# Patient Record
Sex: Female | Born: 1945
Health system: Southern US, Community
[De-identification: ages and names within clinical notes are randomized; demographics above are authoritative.]

## PROBLEM LIST (undated history)

## (undated) DIAGNOSIS — Z8709 Personal history of other diseases of the respiratory system: Secondary | ICD-10-CM

## (undated) DIAGNOSIS — R0600 Dyspnea, unspecified: Secondary | ICD-10-CM

## (undated) DIAGNOSIS — M8580 Other specified disorders of bone density and structure, unspecified site: Secondary | ICD-10-CM

## (undated) DIAGNOSIS — Z860100 Personal history of colon polyps, unspecified: Secondary | ICD-10-CM

## (undated) DIAGNOSIS — T7840XA Allergy, unspecified, initial encounter: Secondary | ICD-10-CM

## (undated) DIAGNOSIS — I10 Essential (primary) hypertension: Secondary | ICD-10-CM

## (undated) DIAGNOSIS — M1711 Unilateral primary osteoarthritis, right knee: Secondary | ICD-10-CM

## (undated) DIAGNOSIS — M1990 Unspecified osteoarthritis, unspecified site: Secondary | ICD-10-CM

## (undated) DIAGNOSIS — M2550 Pain in unspecified joint: Secondary | ICD-10-CM

## (undated) DIAGNOSIS — M2540 Effusion, unspecified joint: Secondary | ICD-10-CM

## (undated) DIAGNOSIS — Z8619 Personal history of other infectious and parasitic diseases: Secondary | ICD-10-CM

## (undated) DIAGNOSIS — K219 Gastro-esophageal reflux disease without esophagitis: Secondary | ICD-10-CM

## (undated) HISTORY — DX: Unilateral primary osteoarthritis, right knee: M17.11

## (undated) HISTORY — DX: Personal history of colon polyps, unspecified: Z86.0100

## (undated) HISTORY — PX: COLONOSCOPY: SHX174

## (undated) HISTORY — PX: APPENDECTOMY: SHX54

## (undated) HISTORY — DX: Allergy, unspecified, initial encounter: T78.40XA

## (undated) HISTORY — DX: Other specified disorders of bone density and structure, unspecified site: M85.80

## (undated) HISTORY — DX: Personal history of colonic polyps: Z86.010

---

## 2004-07-09 ENCOUNTER — Ambulatory Visit: Payer: Self-pay | Admitting: Family Medicine

## 2004-07-20 ENCOUNTER — Ambulatory Visit: Payer: Self-pay | Admitting: Family Medicine

## 2004-08-21 ENCOUNTER — Ambulatory Visit: Payer: Self-pay | Admitting: Family Medicine

## 2005-07-12 HISTORY — PX: NASAL POLYP SURGERY: SHX186

## 2005-07-12 HISTORY — PX: CHOLECYSTECTOMY: SHX55

## 2005-07-19 ENCOUNTER — Ambulatory Visit: Payer: Self-pay | Admitting: Family Medicine

## 2005-07-20 ENCOUNTER — Encounter (INDEPENDENT_AMBULATORY_CARE_PROVIDER_SITE_OTHER): Payer: Self-pay | Admitting: Specialist

## 2005-07-20 ENCOUNTER — Ambulatory Visit (HOSPITAL_COMMUNITY): Admission: EM | Admit: 2005-07-20 | Discharge: 2005-07-21 | Payer: Self-pay | Admitting: Emergency Medicine

## 2005-07-28 ENCOUNTER — Ambulatory Visit: Payer: Self-pay | Admitting: Family Medicine

## 2005-07-29 ENCOUNTER — Ambulatory Visit: Payer: Self-pay | Admitting: Family Medicine

## 2005-08-30 ENCOUNTER — Encounter: Admission: RE | Admit: 2005-08-30 | Discharge: 2005-08-30 | Payer: Self-pay | Admitting: Neurology

## 2007-02-28 DIAGNOSIS — J45909 Unspecified asthma, uncomplicated: Secondary | ICD-10-CM | POA: Insufficient documentation

## 2007-02-28 DIAGNOSIS — Z860100 Personal history of colon polyps, unspecified: Secondary | ICD-10-CM | POA: Insufficient documentation

## 2007-02-28 DIAGNOSIS — M8580 Other specified disorders of bone density and structure, unspecified site: Secondary | ICD-10-CM | POA: Insufficient documentation

## 2007-02-28 DIAGNOSIS — F329 Major depressive disorder, single episode, unspecified: Secondary | ICD-10-CM | POA: Insufficient documentation

## 2010-04-06 ENCOUNTER — Telehealth: Payer: Self-pay | Admitting: Family Medicine

## 2010-04-06 ENCOUNTER — Encounter: Payer: Self-pay | Admitting: Family Medicine

## 2010-05-28 ENCOUNTER — Encounter: Payer: Self-pay | Admitting: Family Medicine

## 2010-05-28 ENCOUNTER — Other Ambulatory Visit: Admission: RE | Admit: 2010-05-28 | Discharge: 2010-05-28 | Payer: Self-pay | Admitting: Family Medicine

## 2010-05-28 ENCOUNTER — Ambulatory Visit: Payer: Self-pay | Admitting: Family Medicine

## 2010-05-28 DIAGNOSIS — I10 Essential (primary) hypertension: Secondary | ICD-10-CM | POA: Insufficient documentation

## 2010-05-28 LAB — CONVERTED CEMR LAB
Nitrite: NEGATIVE
Urobilinogen, UA: 0.2
WBC Urine, dipstick: NEGATIVE

## 2010-05-29 LAB — CONVERTED CEMR LAB
Albumin: 3.8 g/dL (ref 3.5–5.2)
Alkaline Phosphatase: 87 units/L (ref 39–117)
Basophils Relative: 0.9 % (ref 0.0–3.0)
CO2: 30 meq/L (ref 19–32)
Calcium: 9.4 mg/dL (ref 8.4–10.5)
Chloride: 100 meq/L (ref 96–112)
Eosinophils Absolute: 0.2 10*3/uL (ref 0.0–0.7)
Hemoglobin: 14 g/dL (ref 12.0–15.0)
Lymphocytes Relative: 18.9 % (ref 12.0–46.0)
MCHC: 33.9 g/dL (ref 30.0–36.0)
MCV: 92.1 fL (ref 78.0–100.0)
Neutro Abs: 5.6 10*3/uL (ref 1.4–7.7)
RBC: 4.49 M/uL (ref 3.87–5.11)
Sodium: 140 meq/L (ref 135–145)
Total CHOL/HDL Ratio: 3
Total Protein: 6.7 g/dL (ref 6.0–8.3)
Vit D, 25-Hydroxy: 48 ng/mL (ref 30–89)

## 2010-06-02 ENCOUNTER — Encounter: Payer: Self-pay | Admitting: Family Medicine

## 2010-06-03 ENCOUNTER — Encounter: Payer: Self-pay | Admitting: Family Medicine

## 2010-06-03 ENCOUNTER — Ambulatory Visit: Payer: Self-pay | Admitting: Family Medicine

## 2010-06-18 ENCOUNTER — Encounter
Admission: RE | Admit: 2010-06-18 | Discharge: 2010-06-18 | Payer: Self-pay | Source: Home / Self Care | Attending: Family Medicine | Admitting: Family Medicine

## 2010-06-23 ENCOUNTER — Telehealth: Payer: Self-pay | Admitting: *Deleted

## 2010-08-11 ENCOUNTER — Ambulatory Visit
Admission: RE | Admit: 2010-08-11 | Discharge: 2010-08-11 | Payer: Self-pay | Source: Home / Self Care | Attending: Family Medicine | Admitting: Family Medicine

## 2010-08-11 NOTE — Letter (Signed)
Summary: Results Follow-up Letter  [place] [facility] at [facility]  [address] [place], [place] [address]   Phone: [phone]  Fax: [phone]    06/02/2010  [address] [place], [place]  [address]  Dear Ms. Sjogren,   The following are the results of your recent test(s):  Test     Result     Pap Smear    Normal___YES REPEAT IN 2 YEARS ____   Sincerely,  Dr. Almon Hercules     [place] [facility]

## 2010-08-11 NOTE — Letter (Signed)
Summary: [facility]  [facility]   Imported By: Maryln Gottron 04/09/2010 13:11:18  _____________________________________________________________________  External Attachment:    Type:   Image     Comment:   External Document

## 2010-08-11 NOTE — Progress Notes (Signed)
Summary: PT RE-EST (MEDICARE)  Phone Note Call from Patient   Caller: Patient Summary of Call: The following info was sent via flag to Dr Clent Ridges:    ---- 04/06/2010 9:31 AM, Nelwyn Salisbury MD wrote: yes I will see her again   ---- 04/06/2010 9:29 AM, Debbra Riding wrote: Pt Aimee Lawrence, MRN: [id number]) called in to schedule a cpx appt with Dr Clent Ridges.... Pt has not been to our practice since January 2007 and is now on Medicare.... Will you accept pt as a re-established pt so that cpx can be scheduled?  Pt can be reached at [phone]. Initial call taken by: Debbra Riding,  April 06, 2010 9:46 AM

## 2010-08-11 NOTE — Miscellaneous (Signed)
Summary: BONE DENSITY  Clinical Lists Changes  Orders: Added new Test order of T-Lumbar Vertebral Assessment (77082) - Signed 

## 2010-08-11 NOTE — Letter (Signed)
Summary: PATIENT HX FORM  PATIENT HX FORM   Imported By: Georgian Co 05/28/2010 14:12:10  _____________________________________________________________________  External Attachment:    Type:   Image     Comment:   External Document

## 2010-08-11 NOTE — Assessment & Plan Note (Signed)
Summary: PT RE-EST / CPX (PT WILL COME IN FASTING) // RS   Vital Signs:  Patient profile:   65 year old female Height:      61 inches Weight:      177 pounds BMI:     33.56 O2 Sat:      97 % Temp:     97.9 degrees F BP sitting:   124 / 84  (left arm)  Vitals Entered By: Pura Spice, RN (May 28, 2010 9:06 AM) CC: cpx fasting with pap pt states had pneunovax 2009 and TD at [facility] 2011 -she will get documentation of this faxed to [place]    History of Present Illness: 65 yr old female to re-establish with [place] and for a cpx. We last saw her in 2007. She has split her time between living here in [place] and in [place]. She had a cpx with labs about 2 years ago in [place]. She feels fine today and has no complaints. She has had no SOB or chest pains, and her BP has been stable. She has put on some weight, and she has not been exercising lately. BMs are regular, as are urinations.   Allergies: 1)  ! Erythromycin 2)  ! Ampicillin  Past History:  Past Medical History: Osteopenia, last DEXA in 2009 in [place] Allergies Asthma Colonic polyps, hx of Depression  Past Surgical History: nasal polypectomy2007 colonoscopy 2008 in [place], clear, repeat in 5 yrs  Family History: Reviewed history from 02/28/2007 and no changes required. Family History of Alcoholism/Addiction Family History of Arthritis Family History Breast cancer 1st degree relative <50 Family History Diabetes 1st degree relative Family History of Cardiovascular disorder  Social History: Reviewed history from 02/28/2007 and no changes required. Retired Single Never Smoked Alcohol use-yes Drug use-no Regular exercise-no  Review of Systems  The patient denies anorexia, fever, weight loss, weight gain, vision loss, decreased hearing, hoarseness, chest pain, syncope, dyspnea on exertion, peripheral edema, prolonged cough, headaches, hemoptysis, abdominal pain, melena, hematochezia, severe indigestion/heartburn,  hematuria, incontinence, genital sores, muscle weakness, suspicious skin lesions, transient blindness, difficulty walking, depression, unusual weight change, abnormal bleeding, enlarged lymph nodes, angioedema, breast masses, and testicular masses.    Physical Exam  General:  overweight-appearing.   Head:  Normocephalic and atraumatic without obvious abnormalities. No apparent alopecia or balding. Eyes:  No corneal or conjunctival inflammation noted. EOMI. Perrla. Funduscopic exam benign, without hemorrhages, exudates or papilledema. Vision grossly normal. Ears:  External ear exam shows no significant lesions or deformities.  Otoscopic examination reveals clear canals, tympanic membranes are intact bilaterally without bulging, retraction, inflammation or discharge. Hearing is grossly normal bilaterally. Nose:  External nasal examination shows no deformity or inflammation. Nasal mucosa are pink and moist without lesions or exudates. Mouth:  Oral mucosa and oropharynx without lesions or exudates.  Teeth in good repair. Neck:  No deformities, masses, or tenderness noted. Chest Wall:  No deformities, masses, or tenderness noted. Breasts:  No mass, nodules, thickening, tenderness, bulging, retraction, inflamation, nipple discharge or skin changes noted.   Lungs:  Normal respiratory effort, chest expands symmetrically. Lungs are clear to auscultation, no crackles or wheezes. Heart:  Normal rate and regular rhythm. S1 and S2 normal without gallop, murmur, click, rub or other extra sounds. EKG normal Abdomen:  Bowel sounds positive,abdomen soft and non-tender without masses, organomegaly or hernias noted. Rectal:  No external abnormalities noted. Normal sphincter tone. No rectal masses or tenderness. Heme neg.  Genitalia:  Pelvic Exam:  External: normal female genitalia without lesions or masses        Vagina: normal without lesions or masses        Cervix: normal without lesions or masses         Adnexa: normal bimanual exam without masses or fullness        Uterus: normal by palpation        Pap smear: performed Msk:  No deformity or scoliosis noted of thoracic or lumbar spine.   Pulses:  R and L carotid,radial,femoral,dorsalis pedis and posterior tibial pulses are full and equal bilaterally Extremities:  No clubbing, cyanosis, edema, or deformity noted with normal full range of motion of all joints.   Neurologic:  No cranial nerve deficits noted. Station and gait are normal. Plantar reflexes are down-going bilaterally. DTRs are symmetrical throughout. Sensory, motor and coordinative functions appear intact. Skin:  Intact without suspicious lesions or rashes Cervical Nodes:  No lymphadenopathy noted Axillary Nodes:  No palpable lymphadenopathy Inguinal Nodes:  No significant adenopathy Psych:  Cognition and judgment appear intact. Alert and cooperative with normal attention span and concentration. No apparent delusions, illusions, hallucinations   Impression & Recommendations:  Problem # 1:  ASTHMA (ICD-493.90)  The following medications were removed from the medication list:    Albuterol 90 Mcg/act Aers (Albuterol)  Problem # 2:  HYPERTENSION (ICD-401.9)  Orders: UA Dipstick w/o Micro (automated)  (81003) EKG w/ Interpretation (93000) Venipuncture (16109) TLB-Lipid Panel (80061-LIPID) TLB-BMP (Basic Metabolic Panel-BMET) (80048-METABOL) TLB-CBC Platelet - w/Differential (85025-CBCD) TLB-Hepatic/Liver Function Pnl (80076-HEPATIC) TLB-TSH (Thyroid Stimulating Hormone) (84443-TSH) Specimen Handling (60454)  Problem # 3:  DEPRESSION (ICD-311)  Problem # 4:  COLONIC POLYPS, HX OF (ICD-V12.72)  Orders: Hemoccult Guaiac-1 spec.(in office) (82270)  Problem # 5:  OSTEOPENIA (ICD-733.90)  The following medications were removed from the medication list:    Fosamax 35 Mg Tabs (Alendronate sodium)  Orders: T-Vitamin D (25-Hydroxy) (09811-91478) Specimen Handling  (99000)  Patient Instructions: 1)  It is important that you exercise reguarly at least 20 minutes 5 times a week. If you develop chest pain, have severe difficulty breathing, or feel very tired, stop exercising immediately and seek medical attention.  2)  You need to lose weight. Consider a lower calorie diet and regular exercise.  3)  Schedule your mammogram.  4)  get labs today 5)  set up a DEXA   Orders Added: 1)  New Patient Level IV [99204] 2)  UA Dipstick w/o Micro (automated)  [81003] 3)  Hemoccult Guaiac-1 spec.(in office) [82270] 4)  EKG w/ Interpretation [93000] 5)  Venipuncture [36415] 6)  TLB-Lipid Panel [80061-LIPID] 7)  TLB-BMP (Basic Metabolic Panel-BMET) [80048-METABOL] 8)  TLB-CBC Platelet - w/Differential [85025-CBCD] 9)  TLB-Hepatic/Liver Function Pnl [80076-HEPATIC] 10)  TLB-TSH (Thyroid Stimulating Hormone) [84443-TSH] 11)  T-Vitamin D (25-Hydroxy) [29562-13086] 12)  Specimen Handling [99000]   Immunization History:  Tetanus/Td Immunization History:    Tetanus/Td:  historical (05/28/2010)  Pneumovax Immunization History:    Pneumovax:  historical (07/13/2007)   Immunization History:  Tetanus/Td Immunization History:    Tetanus/Td:  Historical (05/28/2010)  Pneumovax Immunization History:    Pneumovax:  Historical (07/13/2007)   [facility]     Laboratory Results   Urine Tests    Routine Urinalysis   Color: yellow Appearance: Clear Glucose: negative   (Normal Range: Negative) Bilirubin: negative   (Normal Range: Negative) Ketone: negative   (Normal Range: Negative) Spec. Gravity: 1.010   (Normal Range: 1.003-1.035) Blood: 1+   (  Normal Range: Negative) pH: 5.0   (Normal Range: 5.0-8.0) Protein: negative   (Normal Range: Negative) Urobilinogen: 0.2   (Normal Range: 0-1) Nitrite: negative   (Normal Range: Negative) Leukocyte Esterace: negative   (Normal Range: Negative)    Comments: Rita Ohara  May 28, 2010 10:43 AM     Appended Document: referral for DEXA     Clinical Lists Changes  Orders: Added new Test order of T-Bone Densitometry [phone]) - Signed

## 2010-08-13 NOTE — Progress Notes (Signed)
Summary: bmd results  Phone Note Call from Patient Call back at [phone]   Caller: Patient Call For: Nelwyn Salisbury MD Summary of Call: PT would like bmd results Initial call taken by: Heron Sabins,  June 23, 2010 10:25 AM  Follow-up for Phone Call        see report Follow-up by: Nelwyn Salisbury MD,  June 23, 2010 1:28 PM  Additional Follow-up for Phone Call Additional follow up Details #1::        see lab results. Additional Follow-up by: Romualdo Bolk, CMA (AAMA),  June 23, 2010 3:12 PM

## 2010-11-27 NOTE — Op Note (Signed)
NAMEFRANSISCA, Aimee Lawrence                   ACCOUNT NO.:  [id number]   MEDICAL RECORD NO.:  [id number]          PATIENT TYPE:  INP   LOCATION:  0102                         FACILITY:  [facility]   PHYSICIAN:  Anselm Pancoast. Weatherly, M.D.DATE OF BIRTH:  11-08-1945   DATE OF PROCEDURE:  07/20/2005  DATE OF DISCHARGE:                                 OPERATIVE REPORT   PREOPERATIVE DIAGNOSIS:  Acute cholecystitis with stones.   POSTOPERATIVE DIAGNOSIS:  Acute cholecystitis with stones.   OPERATION:  Laparoscopic cholecystectomy with cholangiogram.   SURGEON:  Anselm Pancoast. Zachery Dakins, MD.   ASSISTANT:  Alfonse Ras, MD.   HISTORY:  Aimee Lawrence is a 65 year old female who had the onset of epigastric  pain right side after eating supper on a Saturday. [place] the [place]. She  went to the emergency room there and was scheduled for an ultrasound the  following morning. No laboratory studies were done. The ultrasound showed  gallstones but not any signs of acute inflammation of the gallbladder. She  returned to the [facility] and was seen the following day by her physician,  Dr. Abran Cantor who referred her over to the ER. I evaluated her and time did not  permit her to get on the OR schedule yesterday and she is scheduled for  today. It is now later in the afternoon before time really is available to  proceed with surgery.  She has been on Cipro and was given a dose  preoperatively. She has PAS stockings. The abdomen was prepped with Betadine  surgical solution and draped in a sterile manner after the induction of  general anesthesia and endotracheal tube. A small incision was made below  the umbilicus, kind of carefully opened through the fascia and then the  peritoneum was picked up and I  carefully opened through it and I could  insert my finger into the peritoneal cavity. A pursestring suture of #0  Vicryl was placed and then the Hasson cannula introduced. The gallbladder  was very acutely inflamed with  fluid around it and a lot of adhesions. The  upper 10 mm trocar was placed in the subxiphoid area and then the two  lateral 5 mm trocars were placed the appropriate lateral position. The liver  was large enough that it kind of hangs down and it was difficult because of  the position so close to the umbilical port and this is why her pain was so  low. I grasped the gallbladder and retracted upward and outward. The  adhesions to the omentum were carefully taken down and then we could get to  the base of the gallbladder. I then opened the peritoneum, identified the  cystic duct and this was encompassed with a right angle clip flush with the  gallbladder and then a Cook catheter introduced just proximal and held in  place with a clip, x-ray obtained. It shows good filling of the extrahepatic  biliary system low into the duodenum, _____________ common bile duct. The  intrahepatic radicals filled. I then removed the catheter, triply clipped  the cystic duct after  encompassing the right angle and then divided it. The  cystic artery was dissected free, doubly clipped proximally, singly,  distally and divided and then using the first hook and then going to the  spatula with electrocautery, the gallbladder was freed from its bed. There  was some bile spillage and the two stones were grasped with pooper scooper  and then we used the ___________ aspirator to remove all the bile, etc. Then  I went ahead and freed the carbon out of the gallbladder from the bed with  the spatula set up on regular ___________. I placed the inflamed gallbladder  in an EndoCatch bag and then continued kind of cauterizing the base with  good hemostasis irrigating and aspirating satisfactory and then I had placed  the gallbladder in an EndoCatch bag, switched the camera into the upper 10  mm port, withdrew the bag ___________  gallbladder ___________ quadrants. We  opened the bag, withdrew the gallbladder. There were numerous  fairly large  stones as well as a bunch of small stones and these were brushed and then  brought out without enlarging the fascia at the umbilicus. An additional  figure-of-eight suture of #0 Vicryl was placed and both were tied. I  reinspected, the irrigating fluid was aspirated, there was no evidence of  any bleeding or bile. The little fluid that was in the pelvis was aspirated  and then the 5 mm port was withdrawn under direct vision and brought outside  the body. The patient tolerated the procedure nicely ___________. The  patient will be kept overnight ___________ Cipro and will be following her  sugar.  Sugars have not been elevated here.           ______________________________  Anselm Pancoast. Zachery Dakins, M.D.     WJW/MEDQ  D:  07/20/2005  T:  07/21/2005  Job:  [id number]   cc:   Jeannett Senior A. Clent Ridges, M.D. [facility]  [address] [place]  [place] [address]

## 2010-11-27 NOTE — H&P (Signed)
NAMELYZBETH, GENRICH                   ACCOUNT NO.:  [id number]   MEDICAL RECORD NO.:  [id number]          PATIENT TYPE:  INP   LOCATION:  0102                         FACILITY:  [facility]   PHYSICIAN:  Anselm Pancoast. Weatherly, M.D.DATE OF BIRTH:  17-Nov-1945   DATE OF ADMISSION:  07/19/2005  DATE OF DISCHARGE:                                HISTORY & PHYSICAL   CHIEF COMPLAINT:  Abdominal pain.   HISTORY:  Charlotta Lapaglia is a 65 year old female, diabetic on oral medication,  who was visiting the beach over the past weekend and on Saturday night,  after eating a heavy meal, she started having severe epigastric pain and  then went to the emergency room where they did an EKG and evaluated her and  thought that she was probably having a gallbladder attack.  They released,  after giving her pain medication, but had her scheduled for an ultrasound of  the gallbladder on Sunday.  This did show gallstones but not acute and  inflamed gallbladder.  She returned to [facility] where she lives and saw  Dr. Clent Ridges in his office on Monday about mid day.  He called when she said she  was having basically continuous pain and presented to the emergency room.  She had a mildly elevated white count.  Her pain, however, was more right  sided much lower than the typical acute gallbladder.  We had a report of the  ultrasound but did not actually have an ultrasound copy and I was concerned  with the pain being so low could this be appendicitis and she still have her  appendix.  I obtained a CT.  The schedule was all backed up and then when  this was actually completed it was about 11:30.  I elected to add her onto  the OR schedule for the following day instead of doing her after midnight  last night.  The patient was admitted, however, there were no beds and she  has been in the emergency room overnight.  I started her on Cipro and gave  her a soap suds enema to cleanse her colon as she had a large amount of  stool in the  colon and she is still having pain the following morning but it  is not as intense as it was previously.  She was added to the OR schedule as  soon as an OR room was available.   PAST MEDICAL HISTORY:  She has had a GYN procedure.   CHRONIC MEDICATIONS:  1.  Nexium 40 mg a day.  2.  She was given Percocet for pain.  3.  Glucophage 500 mg daily.  4.  Adderall 10 mg daily.   ALLERGIES:  1.  PENICILLIN developed swelling.  2.  E-MYCIN.  3.  ASPIRIN.   She does not smoke.   PHYSICAL EXAMINATION:  GENERAL:  She is a short female, appears younger than  her stated age.  VITAL SIGNS:  Temperature is 98.6, pulse 88, respirations 18, blood pressure  is 141/83.  EENT:  Well hydrated.  No cervical or supraclavicular lymphadenopathy.  LUNGS:  Good breath sounds bilaterally.  CARDIAC:  Normal sinus rhythm.  BREASTS:  Negative.  ABDOMEN:  She is mildly tender on the right side of the abdomen but it is  not really right upper quadrant.  It is basically lateral to her umbilicus.  Her left side is not tender.  EXTREMITIES:  No pedal edema and good peripheral pulses.   ADMISSION IMPRESSION:  Acute cholecystitis.   The patient will be added to the OR schedule as time permits according to  the OR schedule.  I have placed her on Cipro and she is NPO.           ______________________________  Anselm Pancoast. Zachery Dakins, M.D.     WJW/MEDQ  D:  07/20/2005  T:  07/20/2005  Job:  [id number]   cc:   Anselm Pancoast. Zachery Dakins, M.D.  [address], [address]    [place] [address]   patient's chart

## 2010-12-09 ENCOUNTER — Encounter: Payer: Self-pay | Admitting: Family Medicine

## 2010-12-09 ENCOUNTER — Ambulatory Visit (INDEPENDENT_AMBULATORY_CARE_PROVIDER_SITE_OTHER): Payer: Medicare Other | Admitting: Family Medicine

## 2010-12-09 VITALS — BP 128/88 | HR 78 | Temp 97.7°F | Resp 14 | Wt 184.5 lb

## 2010-12-09 DIAGNOSIS — L0390 Cellulitis, unspecified: Secondary | ICD-10-CM

## 2010-12-09 DIAGNOSIS — T63301A Toxic effect of unspecified spider venom, accidental (unintentional), initial encounter: Secondary | ICD-10-CM

## 2010-12-09 DIAGNOSIS — L0291 Cutaneous abscess, unspecified: Secondary | ICD-10-CM

## 2010-12-09 DIAGNOSIS — T63391A Toxic effect of venom of other spider, accidental (unintentional), initial encounter: Secondary | ICD-10-CM

## 2010-12-09 DIAGNOSIS — T6391XA Toxic effect of contact with unspecified venomous animal, accidental (unintentional), initial encounter: Secondary | ICD-10-CM

## 2010-12-09 MED ORDER — DOXYCYCLINE HYCLATE 100 MG PO CAPS: 100.0000 mg | ORAL_CAPSULE | Freq: Two times a day (BID) | ORAL | Status: AC

## 2010-12-09 NOTE — Progress Notes (Signed)
  Subjective:    Patient ID: Aimee Lawrence, female    DOB: 08-07-1945, 65 y.o.   MRN: [id number]  HPI Here with a 2 day hx of possible insect bites. She has been trimming shrubbery in her yard lately, and yesterday she noticed 2 red spots on her legs. One is on the right knee and one is on the left lower leg. These have been painful and have been red.    Review of Systems  Constitutional: Negative.   Skin: Positive for rash.       Objective:   Physical Exam  Constitutional: She appears well-developed and well-nourished.  Skin:       There are 2 lesions, one on the medial right knee and the other on the left lower leg. These have a central vessicle containing clear fluid which is surrounded by a zone of erythema.           Assessment & Plan:  These appear to be spider bites. Cover for cellulitis with Doxycycline.

## 2011-06-28 ENCOUNTER — Encounter: Payer: Self-pay | Admitting: Family Medicine

## 2011-06-28 ENCOUNTER — Telehealth: Payer: Self-pay | Admitting: Family Medicine

## 2011-06-28 NOTE — Telephone Encounter (Signed)
Pt has bad head congestion/ sinus infection x 1 week and would like to be seen today.

## 2011-06-28 NOTE — Telephone Encounter (Signed)
Pt is aware of appt on 06/29/2011 at 9:30

## 2011-06-29 ENCOUNTER — Encounter: Payer: Self-pay | Admitting: Family Medicine

## 2011-06-29 ENCOUNTER — Ambulatory Visit (INDEPENDENT_AMBULATORY_CARE_PROVIDER_SITE_OTHER): Payer: Medicare Other | Admitting: Family Medicine

## 2011-06-29 VITALS — BP 112/78 | HR 79 | Temp 98.1°F | Wt 188.0 lb

## 2011-06-29 DIAGNOSIS — J329 Chronic sinusitis, unspecified: Secondary | ICD-10-CM

## 2011-06-29 MED ORDER — AZITHROMYCIN 250 MG PO TABS: ORAL_TABLET | ORAL | Status: AC

## 2011-06-29 NOTE — Progress Notes (Signed)
  Subjective:    Patient ID: Aimee Lawrence, female    DOB: Nov 11, 1945, 65 y.o.   MRN: [id number]  HPI Here for 2 weeks of sinus pressure, PND, ST, and HA.    Review of Systems  Constitutional: Negative.   HENT: Positive for congestion, postnasal drip and sinus pressure.   Eyes: Negative.   Respiratory: Negative.        Objective:   Physical Exam  Constitutional: She appears well-developed and well-nourished.  HENT:  Right Ear: External ear normal.  Left Ear: External ear normal.  Nose: Nose normal.  Mouth/Throat: Oropharynx is clear and moist. No oropharyngeal exudate.  Eyes: Conjunctivae are normal.  Pulmonary/Chest: Effort normal and breath sounds normal.  Lymphadenopathy:    She has no cervical adenopathy.          Assessment & Plan:  Add Mucinex

## 2011-07-05 ENCOUNTER — Telehealth: Payer: Self-pay | Admitting: Family Medicine

## 2011-07-05 MED ORDER — AZITHROMYCIN 250 MG PO TABS: ORAL_TABLET | ORAL | Status: AC

## 2011-07-05 NOTE — Telephone Encounter (Signed)
Pt REQUESTING ZPACK REFILL  pt also requesting to be contacted she had shooting pains in her leg and wanted to know what she should do or could take for this   [facility]

## 2011-07-05 NOTE — Telephone Encounter (Signed)
Call in another Zpack. As for the leg pains, she can try Ibuprofen but she probably needs an OV to look at this

## 2011-07-05 NOTE — Telephone Encounter (Signed)
Script called in and spoke with pt. 

## 2011-08-23 ENCOUNTER — Ambulatory Visit (INDEPENDENT_AMBULATORY_CARE_PROVIDER_SITE_OTHER): Payer: Medicare Other | Admitting: Family Medicine

## 2011-08-23 ENCOUNTER — Encounter: Payer: Self-pay | Admitting: Family Medicine

## 2011-08-23 VITALS — BP 116/72 | HR 80 | Temp 97.7°F | Wt 193.0 lb

## 2011-08-23 DIAGNOSIS — J45901 Unspecified asthma with (acute) exacerbation: Secondary | ICD-10-CM

## 2011-08-23 MED ORDER — ALBUTEROL SULFATE HFA 108 (90 BASE) MCG/ACT IN AERS: 2.0000 | INHALATION_SPRAY | RESPIRATORY_TRACT | Status: DC | PRN

## 2011-08-23 NOTE — Progress Notes (Signed)
  Subjective:    Patient ID: Aimee Lawrence, female    DOB: 1946-06-09, 66 y.o.   MRN: [id number]  HPI Here for intermittent wheezing and SOB for the past few months. No cough or fever or chest pains. She has a hx of asthma but has not treated it for years.    Review of Systems  Constitutional: Negative.   HENT: Negative.   Eyes: Negative.   Respiratory: Positive for shortness of breath. Negative for apnea and cough.   Cardiovascular: Negative.        Objective:   Physical Exam  Constitutional: She appears well-developed and well-nourished.  Neck: No thyromegaly present.  Cardiovascular: Normal rate, normal heart sounds and intact distal pulses.   Pulmonary/Chest: Effort normal and breath sounds normal. No respiratory distress. She has no wheezes. She has no rales.  Lymphadenopathy:    She has no cervical adenopathy.          Assessment & Plan:  Try Proair prn

## 2012-05-29 ENCOUNTER — Encounter: Payer: Self-pay | Admitting: Family Medicine

## 2012-05-29 ENCOUNTER — Ambulatory Visit (INDEPENDENT_AMBULATORY_CARE_PROVIDER_SITE_OTHER): Payer: Medicare Other | Admitting: Family Medicine

## 2012-05-29 ENCOUNTER — Ambulatory Visit (INDEPENDENT_AMBULATORY_CARE_PROVIDER_SITE_OTHER)
Admission: RE | Admit: 2012-05-29 | Discharge: 2012-05-29 | Disposition: A | Discharge status: Home / Self Care | Payer: Medicare Other | Source: Ambulatory Visit | Attending: Family Medicine | Admitting: Family Medicine

## 2012-05-29 VITALS — BP 130/80 | HR 90 | Temp 98.6°F | Wt 188.0 lb

## 2012-05-29 DIAGNOSIS — J329 Chronic sinusitis, unspecified: Secondary | ICD-10-CM

## 2012-05-29 DIAGNOSIS — R0781 Pleurodynia: Secondary | ICD-10-CM

## 2012-05-29 DIAGNOSIS — R079 Chest pain, unspecified: Secondary | ICD-10-CM

## 2012-05-29 MED ORDER — AZITHROMYCIN 250 MG PO TABS: ORAL_TABLET | ORAL | Status: DC

## 2012-05-29 NOTE — Progress Notes (Signed)
  Subjective:    Patient ID: Aimee Lawrence, female    DOB: 1946/03/23, 66 y.o.   MRN: [id number]  HPI Here for 2 things. First she has had a mild pain in the right side for 4 months. No trauma hx. No coughing or SOB. Also for one week she has had sinus pressure, PND, and a ST. No fever.    Review of Systems  Constitutional: Negative.   HENT: Positive for congestion, postnasal drip and sinus pressure.   Eyes: Negative.   Respiratory: Negative.   Cardiovascular: Positive for chest pain. Negative for palpitations and leg swelling.       Objective:   Physical Exam  Constitutional: She appears well-developed and well-nourished.  HENT:  Right Ear: External ear normal.  Left Ear: External ear normal.  Nose: Nose normal.  Mouth/Throat: Oropharynx is clear and moist.  Eyes: Conjunctivae normal are normal.  Neck: No thyromegaly present.  Pulmonary/Chest: Effort normal and breath sounds normal.       Tender over the right ribs just below the axilla. No crepitus   Lymphadenopathy:    She has no cervical adenopathy.          Assessment & Plan:  Treat the sinusitis with a Zpack. Get Xrays of the ribs.

## 2012-05-31 NOTE — Progress Notes (Signed)
Quick Note:  I left voice message with results. ______ 

## 2012-06-05 ENCOUNTER — Other Ambulatory Visit: Payer: Self-pay | Admitting: Family Medicine

## 2012-06-05 ENCOUNTER — Ambulatory Visit (INDEPENDENT_AMBULATORY_CARE_PROVIDER_SITE_OTHER): Payer: Medicare Other | Admitting: Family Medicine

## 2012-06-05 ENCOUNTER — Encounter: Payer: Self-pay | Admitting: Family Medicine

## 2012-06-05 ENCOUNTER — Encounter: Payer: Self-pay | Admitting: Internal Medicine

## 2012-06-05 VITALS — BP 132/84 | HR 86 | Temp 97.9°F | Ht 61.0 in | Wt 187.0 lb

## 2012-06-05 DIAGNOSIS — Z1231 Encounter for screening mammogram for malignant neoplasm of breast: Secondary | ICD-10-CM

## 2012-06-05 DIAGNOSIS — F329 Major depressive disorder, single episode, unspecified: Secondary | ICD-10-CM

## 2012-06-05 DIAGNOSIS — Z860100 Personal history of colon polyps, unspecified: Secondary | ICD-10-CM

## 2012-06-05 DIAGNOSIS — J45909 Unspecified asthma, uncomplicated: Secondary | ICD-10-CM

## 2012-06-05 DIAGNOSIS — I10 Essential (primary) hypertension: Secondary | ICD-10-CM

## 2012-06-05 DIAGNOSIS — F3289 Other specified depressive episodes: Secondary | ICD-10-CM

## 2012-06-05 LAB — CBC WITH DIFFERENTIAL/PLATELET
Basophils Relative: 0.7 % (ref 0.0–3.0)
Eosinophils Relative: 5.3 % — ABNORMAL HIGH (ref 0.0–5.0)
HCT: 41.1 % (ref 36.0–46.0)
Lymphs Abs: 1.9 10*3/uL (ref 0.7–4.0)
MCV: 90.3 fl (ref 78.0–100.0)
Monocytes Absolute: 0.6 10*3/uL (ref 0.1–1.0)
Monocytes Relative: 8.5 % (ref 3.0–12.0)
Neutrophils Relative %: 59.2 % (ref 43.0–77.0)
RBC: 4.56 Mil/uL (ref 3.87–5.11)
WBC: 7.3 10*3/uL (ref 4.5–10.5)

## 2012-06-05 LAB — BASIC METABOLIC PANEL
Calcium: 9.4 mg/dL (ref 8.4–10.5)
GFR: 88.29 mL/min (ref >60.00)
Sodium: 139 mEq/L (ref 135–145)

## 2012-06-05 LAB — LIPID PANEL
HDL: 38.5 mg/dL — ABNORMAL LOW (ref >39.00)
LDL Cholesterol: 106 mg/dL — ABNORMAL HIGH (ref 0–99)
Total CHOL/HDL Ratio: 5
VLDL: 35.4 mg/dL (ref 0.0–40.0)

## 2012-06-05 LAB — POCT URINALYSIS DIPSTICK
Bilirubin, UA: NEGATIVE
Blood, UA: NEGATIVE
Glucose, UA: NEGATIVE
Spec Grav, UA: 1.02

## 2012-06-05 LAB — HEPATIC FUNCTION PANEL
Alkaline Phosphatase: 121 U/L — ABNORMAL HIGH (ref 39–117)
Bilirubin, Direct: 0.1 mg/dL (ref 0.0–0.3)
Total Bilirubin: 0.3 mg/dL (ref 0.3–1.2)

## 2012-06-05 LAB — TSH: TSH: 1.02 u[IU]/mL (ref 0.35–5.50)

## 2012-06-05 MED ORDER — FLUTICASONE PROPIONATE 50 MCG/ACT NA SUSP: 2.0000 | Freq: Every day | NASAL | Status: DC

## 2012-06-05 NOTE — Progress Notes (Signed)
  Subjective:    Patient ID: Aimee Lawrence, female    DOB: 1945/11/02, 66 y.o.   MRN: [id number]  HPI 66 yr old female for a cpx. She feels well in general. She was recently treated for an URI and this has resolved.    Review of Systems  Constitutional: Negative.   HENT: Negative.   Eyes: Negative.   Respiratory: Negative.   Cardiovascular: Negative.   Gastrointestinal: Negative.   Genitourinary: Negative for dysuria, urgency, frequency, hematuria, flank pain, decreased urine volume, enuresis, difficulty urinating, pelvic pain and dyspareunia.  Musculoskeletal: Negative.   Skin: Negative.   Neurological: Negative.   Hematological: Negative.   Psychiatric/Behavioral: Negative.        Objective:   Physical Exam  Constitutional: She is oriented to person, place, and time. She appears well-developed and well-nourished. No distress.  HENT:  Head: Normocephalic and atraumatic.  Right Ear: External ear normal.  Left Ear: External ear normal.  Nose: Nose normal.  Mouth/Throat: Oropharynx is clear and moist. No oropharyngeal exudate.  Eyes: Conjunctivae normal and EOM are normal. Pupils are equal, round, and reactive to light. No scleral icterus.  Neck: Normal range of motion. Neck supple. No JVD present. No thyromegaly present.  Cardiovascular: Normal rate, regular rhythm, normal heart sounds and intact distal pulses.  Exam reveals no gallop and no friction rub.   No murmur heard.      EKG normal   Pulmonary/Chest: Effort normal and breath sounds normal. No respiratory distress. She has no wheezes. She has no rales. She exhibits no tenderness.  Abdominal: Soft. Bowel sounds are normal. She exhibits no distension and no mass. There is no tenderness. There is no rebound and no guarding.  Musculoskeletal: Normal range of motion. She exhibits no edema and no tenderness.  Lymphadenopathy:    She has no cervical adenopathy.  Neurological: She is alert and oriented to person, place, and time.  She has normal reflexes. No cranial nerve deficit. She exhibits normal muscle tone. Coordination normal.  Skin: Skin is warm and dry. No rash noted. No erythema.  Psychiatric: She has a normal mood and affect. Her behavior is normal. Judgment and thought content normal.          Assessment & Plan:  Well exam. Get fasting labs. Set up a colonoscopy. She will set up a mammogram.

## 2012-06-06 ENCOUNTER — Encounter: Payer: Self-pay | Admitting: Family Medicine

## 2012-06-06 NOTE — Progress Notes (Signed)
Quick Note:  I tried to reach pt by phone, no answer. I did put a copy of results in mail. ______ 

## 2012-06-07 ENCOUNTER — Ambulatory Visit
Admission: RE | Admit: 2012-06-07 | Discharge: 2012-06-07 | Disposition: A | Discharge status: Home / Self Care | Payer: Medicare Other | Source: Ambulatory Visit | Attending: Family Medicine | Admitting: Family Medicine

## 2012-06-07 DIAGNOSIS — Z1231 Encounter for screening mammogram for malignant neoplasm of breast: Secondary | ICD-10-CM

## 2012-06-13 ENCOUNTER — Other Ambulatory Visit: Payer: Self-pay | Admitting: Otolaryngology

## 2012-06-13 ENCOUNTER — Ambulatory Visit
Admission: RE | Admit: 2012-06-13 | Discharge: 2012-06-13 | Disposition: A | Discharge status: Home / Self Care | Payer: Medicare Other | Source: Ambulatory Visit | Attending: Otolaryngology | Admitting: Otolaryngology

## 2012-06-13 DIAGNOSIS — R0981 Nasal congestion: Secondary | ICD-10-CM

## 2012-06-23 ENCOUNTER — Encounter (HOSPITAL_BASED_OUTPATIENT_CLINIC_OR_DEPARTMENT_OTHER): Payer: Self-pay | Admitting: *Deleted

## 2012-06-23 NOTE — Progress Notes (Signed)
No heart or resp problems-recent h/p dr fry-no labs needed

## 2012-06-26 ENCOUNTER — Telehealth: Payer: Self-pay | Admitting: *Deleted

## 2012-06-26 ENCOUNTER — Ambulatory Visit (AMBULATORY_SURGERY_CENTER): Payer: Medicare Other | Admitting: *Deleted

## 2012-06-26 VITALS — Ht 61.0 in | Wt 186.4 lb

## 2012-06-26 DIAGNOSIS — Z1211 Encounter for screening for malignant neoplasm of colon: Secondary | ICD-10-CM

## 2012-06-26 MED ORDER — NA SULFATE-K SULFATE-MG SULF 17.5-3.13-1.6 GM/177ML PO SOLN: ORAL | Status: DC

## 2012-06-26 NOTE — H&P (Signed)
PREOPERATIVE H&P  Chief Complaint: nasal congestion  HPI: Aimee Lawrence is a 66 y.o. female who presents for evaluation of nasal obstruction and recurrence of her sinonasal polyps. She had previous surgery for nasal polyps in 2007. She has a significant recurrence of polyps right side worse than left. She's taken to the OR for FESS and removal of sinonasal polyps.  Past Medical History  Diagnosis Date  . Osteopenia     last dexa in 2009 in [place]  . Allergy   . Asthma   . Hx of colonic polyps   . Depression    Past Surgical History  Procedure Date  . Nasal polyp surgery 2007  . Colonoscopy 2008    in [place] clear repeat in 5 years  . Cholecystectomy 2007    lap choli   History   Social History  . Marital Status: Single    Spouse Name: N/A    Number of Children: N/A  . Years of Education: N/A   Social History Main Topics  . Smoking status: Never Smoker   . Smokeless tobacco: Never Used  . Alcohol Use: Yes     Comment: once every 3 months  . Drug Use: No  . Sexually Active: Not on file   Other Topics Concern  . Not on file   Social History Narrative   RetiredSingleRegular exercise- no   Family History  Problem Relation Age of Onset  . Alcohol abuse    . Arthritis    . Breast cancer    . Diabetes    . Heart disease    . Colon cancer Neg Hx   . Stomach cancer Neg Hx    Allergies  Allergen Reactions  . Aleve (Naproxen Sodium)     Short of breath  . Aspirin     Trouble breathing  . Erythromycin Nausea And Vomiting  . Ibuprofen     Short of breath  . Ampicillin Rash   Prior to Admission medications   Medication Sig Start Date End Date Taking? Authorizing Provider  albuterol (PROAIR HFA) 108 (90 BASE) MCG/ACT inhaler Inhale 2 puffs into the lungs every 4 (four) hours as needed for wheezing or shortness of breath. 08/23/11 08/22/12  Nelwyn Salisbury, MD  Ascorbic Acid (VITAMIN C CR PO) Take by mouth.      Historical Provider, MD  Calcium Carbonate (CALCIUM 600  PO) Take by mouth.      Historical Provider, MD  Cetirizine HCl (ZYRTEC PO) Take by mouth daily.    Historical Provider, MD  Cholecalciferol (VITAMIN D-3 PO) Take by mouth.      Historical Provider, MD  FIBER FORMULA CAPS Take by mouth.      Historical Provider, MD  fish oil-omega-3 fatty acids 1000 MG capsule Take 2 g by mouth daily.      Historical Provider, MD  fluticasone (FLONASE) 50 MCG/ACT nasal spray Place 2 sprays into the nose daily. 06/05/12   Nelwyn Salisbury, MD  Na Sulfate-K Sulfate-Mg Sulf (SUPREP BOWEL PREP) SOLN suprep as directed.  No substitutions 06/26/12   Iva Boop, MD  Pediatric Multivit-Minerals-C (KIDS GUMMY BEAR VITAMINS PO) Take 2 each by mouth daily.      Historical Provider, MD     Positive ROS: nasal obstruction  All other systems have been reviewed and were otherwise negative with the exception of those mentioned in the HPI and as above.  Physical Exam: There were no vitals filed for this visit.  General: Alert, no  acute distress Oral: Normal oral mucosa and tonsils Nasal: Bilateral sinonasal polyps R>L Neck: No palpable adenopathy or thyroid nodules Ear: Ear canal is clear with normal appearing TMs Cardiovascular: Regular rate and rhythm, no murmur.  Respiratory: Clear to auscultation Neurologic: Alert and oriented x 3   Assessment/Plan: SINUS DISEASE Plan for Procedure(s): ENDOSCOPIC SINUS SURGERY WITH FUSION Daiva Eves, MD 06/26/2012 4:53 PM

## 2012-06-26 NOTE — Telephone Encounter (Signed)
Colonoscopy ok based upon this info

## 2012-06-26 NOTE — Telephone Encounter (Signed)
Dr Leone Payor: pt is scheduled for direct colonoscopy 07/20/2012.  First colonoscopy 8 years ago in [place].  Pt says: had colon polyps and needed to repeat in 3 years.   Next colonoscopy 2008 in [place]; pt says no polyps.  Pt does not remember who did either colonoscopy procedure.  No family history of colon cancer. Please advise.   Also, she is scheduled for sinus surgery with Dr. Narda Bonds 12/17- 3 weeks before colonoscopy is scheduled. She will ask Dr. Ezzard Standing if it is okay to have colonoscopy 3 weeks after sinus surgery.

## 2012-06-26 NOTE — Telephone Encounter (Deleted)
Dr. Leone Payor: pt is scheduled for direct colonoscopy 07/20/12.  First colonoscopy 8 years in [place].  Pt says: had colon polyps and needed to repeat in 3 years.

## 2012-06-27 ENCOUNTER — Ambulatory Visit (HOSPITAL_BASED_OUTPATIENT_CLINIC_OR_DEPARTMENT_OTHER)
Admission: RE | Admit: 2012-06-27 | Discharge: 2012-06-27 | Disposition: A | Discharge status: Home / Self Care | Payer: Medicare Other | Source: Ambulatory Visit | Attending: Otolaryngology | Admitting: Otolaryngology

## 2012-06-27 ENCOUNTER — Encounter (HOSPITAL_BASED_OUTPATIENT_CLINIC_OR_DEPARTMENT_OTHER): Payer: Self-pay

## 2012-06-27 ENCOUNTER — Encounter (HOSPITAL_BASED_OUTPATIENT_CLINIC_OR_DEPARTMENT_OTHER): Payer: Self-pay | Admitting: Anesthesiology

## 2012-06-27 ENCOUNTER — Encounter (HOSPITAL_BASED_OUTPATIENT_CLINIC_OR_DEPARTMENT_OTHER)
Admission: RE | Disposition: A | Discharge status: Home / Self Care | Payer: Self-pay | Source: Ambulatory Visit | Attending: Otolaryngology

## 2012-06-27 ENCOUNTER — Ambulatory Visit (HOSPITAL_BASED_OUTPATIENT_CLINIC_OR_DEPARTMENT_OTHER): Payer: Medicare Other | Admitting: Anesthesiology

## 2012-06-27 DIAGNOSIS — J45909 Unspecified asthma, uncomplicated: Secondary | ICD-10-CM | POA: Insufficient documentation

## 2012-06-27 DIAGNOSIS — Z8249 Family history of ischemic heart disease and other diseases of the circulatory system: Secondary | ICD-10-CM | POA: Insufficient documentation

## 2012-06-27 DIAGNOSIS — M948X9 Other specified disorders of cartilage, unspecified sites: Secondary | ICD-10-CM | POA: Insufficient documentation

## 2012-06-27 DIAGNOSIS — Z860100 Personal history of colon polyps, unspecified: Secondary | ICD-10-CM | POA: Insufficient documentation

## 2012-06-27 DIAGNOSIS — F329 Major depressive disorder, single episode, unspecified: Secondary | ICD-10-CM | POA: Insufficient documentation

## 2012-06-27 DIAGNOSIS — Z8261 Family history of arthritis: Secondary | ICD-10-CM | POA: Insufficient documentation

## 2012-06-27 DIAGNOSIS — Z886 Allergy status to analgesic agent status: Secondary | ICD-10-CM | POA: Insufficient documentation

## 2012-06-27 DIAGNOSIS — Z9089 Acquired absence of other organs: Secondary | ICD-10-CM | POA: Insufficient documentation

## 2012-06-27 DIAGNOSIS — J338 Other polyp of sinus: Secondary | ICD-10-CM | POA: Insufficient documentation

## 2012-06-27 DIAGNOSIS — Z880 Allergy status to penicillin: Secondary | ICD-10-CM | POA: Insufficient documentation

## 2012-06-27 DIAGNOSIS — F3289 Other specified depressive episodes: Secondary | ICD-10-CM | POA: Insufficient documentation

## 2012-06-27 DIAGNOSIS — Z881 Allergy status to other antibiotic agents status: Secondary | ICD-10-CM | POA: Insufficient documentation

## 2012-06-27 DIAGNOSIS — Z833 Family history of diabetes mellitus: Secondary | ICD-10-CM | POA: Insufficient documentation

## 2012-06-27 DIAGNOSIS — J343 Hypertrophy of nasal turbinates: Secondary | ICD-10-CM | POA: Insufficient documentation

## 2012-06-27 HISTORY — PX: SINUS ENDO W/FUSION: SHX777

## 2012-06-27 LAB — POCT HEMOGLOBIN-HEMACUE: Hemoglobin: 13.6 g/dL (ref 12.0–15.0)

## 2012-06-27 SURGERY — SINUS SURGERY, ENDOSCOPIC, USING COMPUTER-ASSISTED NAVIGATION
Anesthesia: General | Site: Nose | Wound class: Clean Contaminated

## 2012-06-27 MED ORDER — GLYCOPYRROLATE 0.2 MG/ML IJ SOLN
INTRAMUSCULAR | Status: DC | PRN
  Administered 2012-06-27: 0.4 mg via INTRAVENOUS

## 2012-06-27 MED ORDER — OXYMETAZOLINE HCL 0.05 % NA SOLN
NASAL | Status: DC | PRN
  Administered 2012-06-27: 1 via NASAL

## 2012-06-27 MED ORDER — ONDANSETRON HCL 4 MG/2ML IJ SOLN
INTRAMUSCULAR | Status: DC | PRN
  Administered 2012-06-27: 4 mg via INTRAVENOUS

## 2012-06-27 MED ORDER — HYDROCODONE-ACETAMINOPHEN 5-500 MG PO TABS
1.0000 | ORAL_TABLET | Freq: Four times a day (QID) | ORAL | Status: DC | PRN
Start: 2012-06-27 — End: 2012-11-10

## 2012-06-27 MED ORDER — HYDROMORPHONE HCL PF 1 MG/ML IJ SOLN
0.2500 mg | INTRAMUSCULAR | Status: DC | PRN
  Administered 2012-06-27 (×2): 0.5 mg via INTRAVENOUS

## 2012-06-27 MED ORDER — NEOSTIGMINE METHYLSULFATE 1 MG/ML IJ SOLN
INTRAMUSCULAR | Status: DC | PRN
  Administered 2012-06-27: 3 mg via INTRAVENOUS

## 2012-06-27 MED ORDER — LACTATED RINGERS IV SOLN
INTRAVENOUS | Status: DC
  Administered 2012-06-27 (×2): via INTRAVENOUS

## 2012-06-27 MED ORDER — BACITRACIN ZINC 500 UNIT/GM EX OINT
TOPICAL_OINTMENT | CUTANEOUS | Status: DC | PRN
  Administered 2012-06-27: 1 via TOPICAL

## 2012-06-27 MED ORDER — EPHEDRINE SULFATE 50 MG/ML IJ SOLN
INTRAMUSCULAR | Status: DC | PRN
  Administered 2012-06-27: 10 mg via INTRAVENOUS

## 2012-06-27 MED ORDER — PROPOFOL 10 MG/ML IV BOLUS
INTRAVENOUS | Status: DC | PRN
  Administered 2012-06-27: 150 mg via INTRAVENOUS

## 2012-06-27 MED ORDER — ALBUTEROL SULFATE HFA 108 (90 BASE) MCG/ACT IN AERS
INHALATION_SPRAY | RESPIRATORY_TRACT | Status: DC | PRN
  Administered 2012-06-27: 2 via RESPIRATORY_TRACT

## 2012-06-27 MED ORDER — MIDAZOLAM HCL 5 MG/5ML IJ SOLN
INTRAMUSCULAR | Status: DC | PRN
  Administered 2012-06-27: 1 mg via INTRAVENOUS

## 2012-06-27 MED ORDER — LIDOCAINE HCL (CARDIAC) 20 MG/ML IV SOLN
INTRAVENOUS | Status: DC | PRN
  Administered 2012-06-27: 75 mg via INTRAVENOUS

## 2012-06-27 MED ORDER — ROCURONIUM BROMIDE 100 MG/10ML IV SOLN
INTRAVENOUS | Status: DC | PRN
  Administered 2012-06-27: 30 mg via INTRAVENOUS

## 2012-06-27 MED ORDER — CEFAZOLIN SODIUM-DEXTROSE 2-3 GM-% IV SOLR
INTRAVENOUS | Status: DC | PRN
  Administered 2012-06-27: 2 g via INTRAVENOUS

## 2012-06-27 MED ORDER — SODIUM CHLORIDE 0.9 % IR SOLN
Status: DC | PRN
  Administered 2012-06-27: 450 mL

## 2012-06-27 MED ORDER — LIDOCAINE-EPINEPHRINE 1 %-1:100000 IJ SOLN
INTRAMUSCULAR | Status: DC | PRN
  Administered 2012-06-27: 10 mL
  Administered 2012-06-27: 2 mL

## 2012-06-27 MED ORDER — CEPHALEXIN 500 MG PO CAPS: 500.0000 mg | ORAL_CAPSULE | Freq: Two times a day (BID) | ORAL | Status: AC

## 2012-06-27 MED ORDER — FENTANYL CITRATE 0.05 MG/ML IJ SOLN
INTRAMUSCULAR | Status: DC | PRN
  Administered 2012-06-27 (×2): 50 ug via INTRAVENOUS

## 2012-06-27 MED ORDER — DEXAMETHASONE SODIUM PHOSPHATE 4 MG/ML IJ SOLN
INTRAMUSCULAR | Status: DC | PRN
  Administered 2012-06-27: 10 mg via INTRAVENOUS

## 2012-06-27 MED ORDER — METHYLPREDNISOLONE ACETATE 80 MG/ML IJ SUSP
INTRAMUSCULAR | Status: DC | PRN
  Administered 2012-06-27: 80 mg via INTRALESIONAL

## 2012-06-27 SURGICAL SUPPLY — 73 items
BLADE INF TURB ROT M4 2 5PK (BLADE) ×1 IMPLANT
BLADE RAD60 ROTATE M4 4 5PK (BLADE) IMPLANT
BLADE ROTATE RAD 12 4 M4 (BLADE) IMPLANT
BLADE ROTATE RAD 40 4 M4 (BLADE) IMPLANT
BLADE ROTATE TRICUT 4X13 M4 (BLADE) ×2 IMPLANT
BUR HS RAD FRONTAL 3 (BURR) IMPLANT
CANISTER SUC SOCK COL 7 IN (MISCELLANEOUS) ×3 IMPLANT
CANISTER SUCTION 1200CC (MISCELLANEOUS) ×2 IMPLANT
CATH SINUS BALLN 7X16 (CATHETERS) IMPLANT
CATH SINUS BALLN RELIEV 6X16 (SINUPLASTY) IMPLANT
CATH SINUS GUIDE F-70 (CATHETERS) IMPLANT
CATH SINUS GUIDE M/110 (CATHETERS) IMPLANT
CATH SINUS IRRIGATION 2.0 (CATHETERS) IMPLANT
CLEANER CAUTERY TIP 5X5 PAD (MISCELLANEOUS) IMPLANT
CLOTH BEACON ORANGE TIMEOUT ST (SAFETY) ×2 IMPLANT
COAGULATOR SUCT 8FR VV (MISCELLANEOUS) ×1 IMPLANT
DECANTER SPIKE VIAL GLASS SM (MISCELLANEOUS) ×1 IMPLANT
DEVICE INFLATION 20/61 (MISCELLANEOUS) IMPLANT
DRAPE SURG 17X23 STRL (DRAPES) ×1 IMPLANT
DRESSING NASAL KENNEDY 3.5X.9 (MISCELLANEOUS) IMPLANT
DRSG NASAL KENNEDY 3.5X.9 (MISCELLANEOUS)
DRSG NASAL KENNEDY LMNT 8CM (GAUZE/BANDAGES/DRESSINGS) IMPLANT
DRSG NASOPORE 8CM (GAUZE/BANDAGES/DRESSINGS) ×1 IMPLANT
DRSG TELFA 3X8 NADH (GAUZE/BANDAGES/DRESSINGS) IMPLANT
ELECT COATED BLADE 2.86 ST (ELECTRODE) IMPLANT
ELECT NDL BLADE 2-5/6 (NEEDLE) ×1 IMPLANT
ELECT NEEDLE BLADE 2-5/6 (NEEDLE) IMPLANT
ELECT REM PT RETURN 9FT ADLT (ELECTROSURGICAL) ×2
ELECTRODE REM PT RTRN 9FT ADLT (ELECTROSURGICAL) IMPLANT
GLOVE BIO SURGEON STRL SZ7.5 (GLOVE) ×1 IMPLANT
GLOVE BIOGEL PI IND STRL 8 (GLOVE) IMPLANT
GLOVE BIOGEL PI INDICATOR 8 (GLOVE) ×1
GLOVE SKINSENSE NS SZ7.0 (GLOVE) ×3
GLOVE SKINSENSE STRL SZ7.0 (GLOVE) IMPLANT
GLOVE SS BIOGEL STRL SZ 7.5 (GLOVE) ×1 IMPLANT
GLOVE SUPERSENSE BIOGEL SZ 7.5 (GLOVE) ×1
GOWN PREVENTION PLUS XLARGE (GOWN DISPOSABLE) ×3 IMPLANT
GOWN PREVENTION PLUS XXLARGE (GOWN DISPOSABLE) ×3 IMPLANT
HANDLE SINUS GUIDE LP (INSTRUMENTS) IMPLANT
HANDPIECE HYDRODEBRIDER FRONT (BLADE) IMPLANT
HEMOSTAT SURGICEL .5X2 ABSORB (HEMOSTASIS) IMPLANT
HEMOSTAT SURGICEL 2X14 (HEMOSTASIS) IMPLANT
IV NS 1000ML (IV SOLUTION)
IV NS 1000ML BAXH (IV SOLUTION) IMPLANT
IV NS 500ML (IV SOLUTION) ×2
IV NS 500ML BAXH (IV SOLUTION) ×2 IMPLANT
NDL SPNL 25GX3.5 QUINCKE BL (NEEDLE) IMPLANT
NEEDLE 27GAX1X1/2 (NEEDLE) ×2 IMPLANT
NEEDLE SPNL 25GX3.5 QUINCKE BL (NEEDLE) IMPLANT
NS IRRIG 1000ML POUR BTL (IV SOLUTION) ×1 IMPLANT
PACK BASIN DAY SURGERY FS (CUSTOM PROCEDURE TRAY) ×2 IMPLANT
PACK ENT DAY SURGERY (CUSTOM PROCEDURE TRAY) ×2 IMPLANT
PAD CLEANER CAUTERY TIP 5X5 (MISCELLANEOUS)
PAD DRESSING TELFA 3X8 NADH (GAUZE/BANDAGES/DRESSINGS) IMPLANT
PAD ENT ADHESIVE 25PK (MISCELLANEOUS) ×2 IMPLANT
PATTIES SURGICAL .5 X3 (DISPOSABLE) ×2 IMPLANT
PENCIL BUTTON HOLSTER BLD 10FT (ELECTRODE) IMPLANT
SLEEVE SCD COMPRESS KNEE MED (MISCELLANEOUS) ×2 IMPLANT
SOLUTION ANTI FOG 6CC (MISCELLANEOUS) ×2 IMPLANT
SPONGE GAUZE 2X2 8PLY STRL LF (GAUZE/BANDAGES/DRESSINGS) ×2 IMPLANT
SUT CHROMIC 4 0 PS 2 18 (SUTURE) IMPLANT
SUT SILK 2 0 FS (SUTURE) IMPLANT
SYR 3ML 18GX1 1/2 (SYRINGE) ×1 IMPLANT
SYSTEM HYDRODEBRIDER (MISCELLANEOUS) IMPLANT
SYSTEM RELIEVA LUMA ILLUM (SINUPLASTY) IMPLANT
TOWEL OR 17X24 6PK STRL BLUE (TOWEL DISPOSABLE) ×3 IMPLANT
TRACKER ENT INSTRUMENT (MISCELLANEOUS) ×2 IMPLANT
TRACKER ENT PATIENT (MISCELLANEOUS) ×2 IMPLANT
TRAY DSU PREP LF (CUSTOM PROCEDURE TRAY) ×2 IMPLANT
TUBE CONNECTING 20X1/4 (TUBING) ×2 IMPLANT
TUBING STRAIGHTSHOT EPS 5PK (TUBING) ×2 IMPLANT
WATER STERILE IRR 1000ML POUR (IV SOLUTION) ×3 IMPLANT
YANKAUER SUCT BULB TIP NO VENT (SUCTIONS) ×2 IMPLANT

## 2012-06-27 NOTE — Anesthesia Preprocedure Evaluation (Addendum)
Anesthesia Evaluation  Patient identified by MRN, date of birth, ID band Patient awake  General Assessment Comment:History reviewed. Patient denies DM, Atrial fib/irregular heart beat. No CP or SOB.  Reviewed: Allergy & Precautions, H&P , NPO status , Patient's Chart, lab work & pertinent test results  Airway Mallampati: II      Dental   Pulmonary asthma ,  breath sounds clear to auscultation        Cardiovascular hypertension, Pt. on medications Rhythm:Regular Rate:Normal     Neuro/Psych Anxiety    GI/Hepatic negative GI ROS, Neg liver ROS,   Endo/Other  negative endocrine ROS  Renal/GU negative Renal ROS     Musculoskeletal   Abdominal   Peds  Hematology negative hematology ROS (+)   Anesthesia Other Findings   Reproductive/Obstetrics                          Anesthesia Physical Anesthesia Plan  ASA: III  Anesthesia Plan: General   Post-op Pain Management:    Induction: Intravenous  Airway Management Planned: Oral ETT  Additional Equipment:   Intra-op Plan:   Post-operative Plan: Extubation in OR  Informed Consent: I have reviewed the patients History and Physical, chart, labs and discussed the procedure including the risks, benefits and alternatives for the proposed anesthesia with the patient or authorized representative who has indicated his/her understanding and acceptance.     Plan Discussed with: CRNA, Anesthesiologist and Surgeon  Anesthesia Plan Comments:         Anesthesia Quick Evaluation

## 2012-06-27 NOTE — Brief Op Note (Signed)
06/27/2012  10:02 AM  PATIENT:  Aimee Lawrence  66 y.o. female  PRE-OPERATIVE DIAGNOSIS:  SINUS DISEASE  POST-OPERATIVE DIAGNOSIS:  bilateral nasal sinus disease  PROCEDURE:  Procedure(s) (LRB) with comments: ENDOSCOPIC SINUS SURGERY WITH FUSION NAVIGATION (N/A) - FESS FUSION PROTOCOL Bilateral Turbinate Reductions  SURGEON:  Surgeon(s) and Role:    * Drema Halon, MD - Primary  PHYSICIAN ASSISTANT:   ASSISTANTS: none   ANESTHESIA:   general  EBL:  Total I/O In: 1000 [I.V.:1000] Out: -   BLOOD ADMINISTERED:none  DRAINS: none   LOCAL MEDICATIONS USED:  LIDOCAINE with EPI  SPECIMEN:  Source of Specimen:  nasal polyps  DISPOSITION OF SPECIMEN:  PATHOLOGY  COUNTS:  YES  TOURNIQUET:  * No tourniquets in log *  DICTATION: .Other Dictation: Dictation Number [id number]  PLAN OF CARE: Discharge to home after PACU  PATIENT DISPOSITION:  PACU - hemodynamically stable.   Delay start of Pharmacological VTE agent (>24hrs) due to surgical blood loss or risk of bleeding: yes

## 2012-06-27 NOTE — Interval H&P Note (Signed)
History and Physical Interval Note:  06/27/2012 7:30 AM  Aimee Lawrence  has presented today for surgery, with the diagnosis of SINUS DISEASE  The various methods of treatment have been discussed with the patient and family. After consideration of risks, benefits and other options for treatment, the patient has consented to  Procedure(s) (LRB) with comments: ENDOSCOPIC SINUS SURGERY WITH FUSION NAVIGATION (N/A) - FESS FUSION PROTOCOL as a surgical intervention .  The patient's history has been reviewed, patient examined, no change in status, stable for surgery.  I have reviewed the patient's chart and labs.  Questions were answered to the patient's satisfaction.     NEWMAN, CHRISTOPHER

## 2012-06-27 NOTE — Anesthesia Procedure Notes (Signed)
Procedure Name: Intubation Date/Time: 06/27/2012 7:55 AM Performed by: Zenia Resides D Pre-anesthesia Checklist: Patient identified, Emergency Drugs available, Suction available and Patient being monitored Patient Re-evaluated:Patient Re-evaluated prior to inductionOxygen Delivery Method: Circle System Utilized Preoxygenation: Pre-oxygenation with 100% oxygen Intubation Type: IV induction Ventilation: Mask ventilation without difficulty Grade View: Grade I Tube type: Oral Tube size: 7.0 mm Number of attempts: 1 Airway Equipment and Method: stylet and oral airway Placement Confirmation: ETT inserted through vocal cords under direct vision,  positive ETCO2 and breath sounds checked- equal and bilateral Secured at: 23 cm Tube secured with: Tape Dental Injury: Teeth and Oropharynx as per pre-operative assessment

## 2012-06-27 NOTE — Transfer of Care (Signed)
Immediate Anesthesia Transfer of Care Note  Patient: Aimee Lawrence  Procedure(s) Performed: Procedure(s) (LRB) with comments: ENDOSCOPIC SINUS SURGERY WITH FUSION NAVIGATION (N/A) - FESS FUSION PROTOCOL  Patient Location: PACU  Anesthesia Type:General  Level of Consciousness: awake, alert  and oriented  Airway & Oxygen Therapy: Patient Spontanous Breathing and Patient connected to face mask oxygen  Post-op Assessment: Report given to PACU RN and Post -op Vital signs reviewed and stable  Post vital signs: Reviewed and stable  Complications: No apparent anesthesia complications

## 2012-06-27 NOTE — Anesthesia Postprocedure Evaluation (Signed)
  Anesthesia Post-op Note  Patient: Aimee Lawrence  Procedure(s) Performed: Procedure(s) (LRB) with comments: ENDOSCOPIC SINUS SURGERY WITH FUSION NAVIGATION (N/A) - FESS FUSION PROTOCOL  Patient Location: PACU  Anesthesia Type:General  Level of Consciousness: awake  Airway and Oxygen Therapy: Patient Spontanous Breathing  Post-op Pain: mild  Post-op Assessment: Post-op Vital signs reviewed  Post-op Vital Signs: Reviewed  Complications: No apparent anesthesia complications

## 2012-06-28 ENCOUNTER — Encounter (HOSPITAL_BASED_OUTPATIENT_CLINIC_OR_DEPARTMENT_OTHER): Payer: Self-pay | Admitting: Otolaryngology

## 2012-06-28 NOTE — Op Note (Signed)
NAMEYARELLI, DECELLES                   ACCOUNT NO.:  [id number]  MEDICAL RECORD NO.:  [id number]  LOCATION:                                 FACILITY:  PHYSICIAN:  Kristine Garbe. Ezzard Standing, M.D. DATE OF BIRTH:  DATE OF PROCEDURE:  06/27/2012 DATE OF DISCHARGE:                              OPERATIVE REPORT   PREOPERATIVE DIAGNOSES:  Sinonasal polyps with nasal obstruction, turbinate hypertrophy with nasal obstruction.  POSTOPERATIVE DIAGNOSES:  Sinonasal polyps with nasal obstruction, turbinate hypertrophy with nasal obstruction.  OPERATION PERFORMED:  Functional endoscopic sinus surgery with bilateral total ethmoidectomy with removal of sinonasal polyps.  Bilateral maxillary ostial enlargement with removal of sinonasal polyps.  Right sphenoidotomy with removal of polyps.  Bilateral inferior turbinate reductions.  SURGEON:  Kristine Garbe. Ezzard Standing, M.D.  ANESTHESIA:  General endotracheal.  COMPLICATIONS:  None.  BRIEF CLINICAL NOTE:  Aimee Lawrence is a 66 year old female who has had long history of allergies with history of allergies and asthma and now aspirin sensitivity, with history of sinonasal polyps.  She had had previous surgery about 7 or 8 years ago where polyps were removed from the nose.  Over the past year, polyps have recurred and she has redeveloped increasing nasal obstruction and trouble breathing, the right side worse than the left side.  CT scan showed opacification of both ethmoid areas with polyps extruding down to the posterior nasal cavity on the right side.  She had significant mucoperiosteal thickening within the maxillary sinuses and obstruction of a small posterior ethmoid cell on the right side or the small sphenoid region on the right side.  On exam in the office, she has polyps extending down to the nose, right side worse than left.  She has moderate-sized turbinates.  She was taken to the operating room at this time for repeat functional endoscopic sinus  surgery with removal of sinonasal polyps and turbinate reductions.  DESCRIPTION OF PROCEDURE:  After adequate general endotracheal anesthesia, the patient received 2 g of Ancef IV preoperatively as well as Decadron.  The Fusion system was placed and calibrated.  Nose was prepped with Betadine solution.  Nose was then further prepped with cotton pledgets soaked in Afrin and middle meatus area and turbinates and polypoid area was injected with 10 mL of Xylocaine with epinephrine for hemostasis.  First, the right side was approached using Fusion navigation.  Polyps were removed from the middle meatus.  She had several large polyps extruding from the middle meatus area and also a very large polyp extending from the medial aspect of the posterior middle turbinate arising from the sphenoid region or posterior ethmoid region on the right side only.  This fell down into the nasopharynx area.  Using the microdebrider and Fusion navigation, the ethmoid area was cleared of polypoid disease up to the roof superiorly and the lamina laterally.  A portion of the middle turbinate was resected posteriorly where the polyps arise from the posterior ethmoid and sphenoid region. Maxillary ostia was identified.  The polypoid disease was removed around the edges of the maxillary ostia.  Using the 30-degree scope, the maxillary sinus was evaluated, although the maxillary mucosa was very thickened.  There was no large polyps or no nasal antral polyps extending from maxillary ostia.  There were polyps arising from the ostia edges and these were removed with the microdebrider.  Next, the left side was approached.  The left polyps were not quite as bad as the right side.  The middle meatus was opened, the middle turbinate was fractured medially, and the microdebrider was used to remove polypoid tissue from the anterior as well as posterior ethmoid area.  The sphenoidotomy on the left side was already enlarged and  the sphenoid sinus was actually fairly clear on the left side.  Again, polypoid tissue was removed from the edges of the maxillary ostia.  Using the 30- degree scope, the ostia was visualized and similar to the opposite of the right side, the maxillary mucosa was very thickened, but no large polyps extending through the ostia down to the nasopharynx.  The nasal frontal area likewise was debrided with the microdebrider.  This completed the removal of the polyps.  Next, using the turbinate blade, submucosal turbinate reductions were performed of the inferior turbinates bilaterally and the turbinate bone was outfractured.  This completed the procedure.  The middle meatus area was packed with NasoPore on both sides and then 40 mg of Depo-Medrol injected into the middle meatus and nasal frontal area as well as a little bit into the inferior turbinates on both sides.  This completed the procedure.  Nose was re-evaluated with an endoscope with minimal bleeding.  Emeri was subsequently awakened from anesthesia and transferred to the recovery room, postop doing well.  DISPOSITION:  Quintara was discharged home later this morning on Keflex 500 mg b.i.d. for 1 week, Tylenol and Vicodin p.r.n. pain.  We will have her follow up in my office in 3 days for recheck.          ______________________________ Kristine Garbe. Ezzard Standing, M.D.     CEN/MEDQ  D:  06/27/2012  T:  06/27/2012  Job:  [id number]

## 2012-07-20 ENCOUNTER — Encounter: Payer: Self-pay | Admitting: Internal Medicine

## 2012-07-20 ENCOUNTER — Ambulatory Visit (AMBULATORY_SURGERY_CENTER): Payer: Medicare Other | Admitting: Internal Medicine

## 2012-07-20 VITALS — BP 134/76 | HR 64 | Temp 98.5°F | Resp 43 | Ht 61.0 in | Wt 186.0 lb

## 2012-07-20 DIAGNOSIS — D126 Benign neoplasm of colon, unspecified: Secondary | ICD-10-CM

## 2012-07-20 DIAGNOSIS — Z86010 Personal history of colonic polyps: Secondary | ICD-10-CM

## 2012-07-20 DIAGNOSIS — Z1211 Encounter for screening for malignant neoplasm of colon: Secondary | ICD-10-CM

## 2012-07-20 MED ORDER — SODIUM CHLORIDE 0.9 % IV SOLN: 500.0000 mL | INTRAVENOUS | Status: DC

## 2012-07-20 NOTE — Progress Notes (Signed)
Lidocaine-40mg IV prior to Propofol InductionPropofol given over incremental dosages 

## 2012-07-20 NOTE — Progress Notes (Signed)
Called to room to assist during endoscopic procedure.  Patient ID and intended procedure confirmed with present staff. Received instructions for my participation in the procedure from the performing physician.  

## 2012-07-20 NOTE — Progress Notes (Signed)
Patient did not experience any of the following events: a burn prior to discharge; a fall within the facility; wrong site/side/patient/procedure/implant event; or a hospital transfer or hospital admission upon discharge from the facility. (G8907) Patient did not have preoperative order for IV antibiotic SSI prophylaxis. (G8918)  

## 2012-07-20 NOTE — Patient Instructions (Addendum)
I removed 4 polyps. One was medium-sized the other 3 tiny. Two of the tiny ones were not captured to send to pathology but based upon their size and appearance should not be any trouble (plus they are removed).  I will let you know about the pathology results and timing of next routine colonoscopy by mail.  Thank you for choosing me and [facility].  Iva Boop, MD, FACG   YOU HAD AN ENDOSCOPIC PROCEDURE TODAY AT THE  ENDOSCOPY CENTER: Refer to the procedure report that was given to you for any specific questions about what was found during the examination.  If the procedure report does not answer your questions, please call your gastroenterologist to clarify.  If you requested that your care partner not be given the details of your procedure findings, then the procedure report has been included in a sealed envelope for you to review at your convenience later.  YOU SHOULD EXPECT: Some feelings of bloating in the abdomen. Passage of more gas than usual.  Walking can help get rid of the air that was put into your GI tract during the procedure and reduce the bloating. If you had a lower endoscopy (such as a colonoscopy or flexible sigmoidoscopy) you may notice spotting of blood in your stool or on the toilet paper. If you underwent a bowel prep for your procedure, then you may not have a normal bowel movement for a few days.  DIET: Your first meal following the procedure should be a light meal and then it is ok to progress to your normal diet.  A half-sandwich or bowl of soup is an example of a good first meal.  Heavy or fried foods are harder to digest and may make you feel nauseous or bloated.  Likewise meals heavy in dairy and vegetables can cause extra gas to form and this can also increase the bloating.  Drink plenty of fluids but you should avoid alcoholic beverages for 24 hours.  ACTIVITY: Your care partner should take you home directly after the procedure.  You should  plan to take it easy, moving slowly for the rest of the day.  You can resume normal activity the day after the procedure however you should NOT DRIVE or use heavy machinery for 24 hours (because of the sedation medicines used during the test).    SYMPTOMS TO REPORT IMMEDIATELY: A gastroenterologist can be reached at any hour.  During normal business hours, 8:30 AM to 5:00 PM Monday through Friday, call [phone].  After hours and on weekends, please call the GI answering service at [phone] who will take a message and have the physician on call contact you.   Following lower endoscopy (colonoscopy or flexible sigmoidoscopy):  Excessive amounts of blood in the stool  Significant tenderness or worsening of abdominal pains  Swelling of the abdomen that is new, acute  Fever of 100F or higher   FOLLOW UP: If any biopsies were taken you will be contacted by phone or by letter within the next 1-3 weeks.  Call your gastroenterologist if you have not heard about the biopsies in 3 weeks.  Our staff will call the home number listed on your records the next business day following your procedure to check on you and address any questions or concerns that you may have at that time regarding the information given to you following your procedure. This is a courtesy call and so if there is no answer at the home number and we  have not heard from you through the emergency physician on call, we will assume that you have returned to your regular daily activities without incident.  SIGNATURES/CONFIDENTIALITY: You and/or your care partner have signed paperwork which will be entered into your electronic medical record.  These signatures attest to the fact that that the information above on your After Visit Summary has been reviewed and is understood.  Full responsibility of the confidentiality of this discharge information lies with you and/or your care-partner.    Polyp information given.

## 2012-07-20 NOTE — Op Note (Signed)
[facility] [address] [place], [address]   COLONOSCOPY PROCEDURE REPORT  PATIENT: Aimee Lawrence, Aimee Lawrence  MR#: [id number] BIRTHDATE: 08-Feb-1946 , 66  yrs. old GENDER: Female ENDOSCOPIST: Iva Boop, MD, [facility] REFERRED OZ:[id number] Marguerita Beards, M.D. PROCEDURE DATE:  07/20/2012 PROCEDURE:   Colonoscopy with snare polypectomy ASA CLASS:   Class II INDICATIONS:Screening and surveillance,personal history of colonic polyps and Patient's personal history of colon polyps. MEDICATIONS: propofol (Diprivan) 250mg  IV, MAC sedation, administered by CRNA, and These medications were titrated to patient response per physician's verbal order  DESCRIPTION OF PROCEDURE:   After the risks benefits and alternatives of the procedure were thoroughly explained, informed consent was obtained.  A digital rectal exam revealed no abnormalities of the rectum.   The LB CF-[id number]  endoscope was introduced through the anus and advanced to the cecum, which was identified by both the appendix and ileocecal valve. No adverse events experienced.   The quality of the prep was Suprep excellent The instrument was then slowly withdrawn as the colon was fully examined.      COLON FINDINGS: A smooth sessile polyp measuring 15 mm in size was found in the ascending colon.  A piecemeal polypectomy was performed with a cold snare.  The resection was complete and the polyp tissue was completely retrieved.   Three smooth sessile polyps ranging between 3-75mm in size were found in the transverse colon and sigmoid colon.  A polypectomy was performed with a cold snare.  The resection was complete and the polyp tissue was partially retrieved.   The colon mucosa was otherwise normal.   A right colon retroflexion was performed.  Retroflexed views revealed no abnormalities. The time to cecum=2 minutes 30 seconds. Withdrawal time=19 minutes 53 seconds.  The scope was withdrawn and the procedure  completed. COMPLICATIONS: There were no complications.  ENDOSCOPIC IMPRESSION: 1.   Sessile polyp measuring 15 mm in size was found in the ascending colon; polypectomy was performed with a cold snare 2.   Three sessile polyps ranging between 3-81mm in size were found in the transverse colon and sigmoid colon; polypectomy was performed with a cold snare 3.   The colon mucosa was otherwise normal with excellent prep in patient with prior polyps 2003 but none 2008.  RECOMMENDATIONS: Timing of repeat colonoscopy will be determined by pathology findings.   eSigned:  Iva Boop, MD, [facility] 07/20/2012 11:48 AM   cc: Nelwyn Salisbury, MD and The Patient

## 2012-07-21 ENCOUNTER — Telehealth: Payer: Self-pay

## 2012-07-21 NOTE — Telephone Encounter (Signed)
Left message on answering machine. 

## 2012-07-26 ENCOUNTER — Encounter: Payer: Self-pay | Admitting: Internal Medicine

## 2012-07-26 NOTE — Progress Notes (Signed)
Quick Note:  Sessile serrated adenoma Repeat colonoscopy 3 years - 07/2015 ______

## 2012-08-26 ENCOUNTER — Other Ambulatory Visit: Payer: Self-pay

## 2012-11-10 ENCOUNTER — Ambulatory Visit (INDEPENDENT_AMBULATORY_CARE_PROVIDER_SITE_OTHER): Payer: Medicare Other | Admitting: Family Medicine

## 2012-11-10 ENCOUNTER — Ambulatory Visit (INDEPENDENT_AMBULATORY_CARE_PROVIDER_SITE_OTHER)
Admission: RE | Admit: 2012-11-10 | Discharge: 2012-11-10 | Disposition: A | Discharge status: Home / Self Care | Payer: Medicare Other | Source: Ambulatory Visit | Attending: Family Medicine | Admitting: Family Medicine

## 2012-11-10 ENCOUNTER — Encounter: Payer: Self-pay | Admitting: Family Medicine

## 2012-11-10 VITALS — BP 122/70 | HR 79 | Temp 98.1°F | Wt 175.0 lb

## 2012-11-10 DIAGNOSIS — J45909 Unspecified asthma, uncomplicated: Secondary | ICD-10-CM

## 2012-11-10 DIAGNOSIS — R0602 Shortness of breath: Secondary | ICD-10-CM

## 2012-11-10 MED ORDER — BUDESONIDE-FORMOTEROL FUMARATE 160-4.5 MCG/ACT IN AERO: 2.0000 | INHALATION_SPRAY | Freq: Two times a day (BID) | RESPIRATORY_TRACT | Status: DC

## 2012-11-10 NOTE — Progress Notes (Signed)
  Subjective:    Patient ID: Aimee Lawrence, female    DOB: 04-06-1946, 67 y.o.   MRN: [id number]  HPI Here for 2 weeks of wheezing and mild SOB. She has had to use her albuterol every day at least once. No coughing or fever. Also using Zyrtec and Flonase.    Review of Systems  Constitutional: Negative.   HENT: Positive for congestion and postnasal drip.   Eyes: Negative.   Respiratory: Positive for chest tightness and wheezing. Negative for cough.   Cardiovascular: Negative.        Objective:   Physical Exam  Constitutional: She appears well-developed and well-nourished. No distress.  HENT:  Right Ear: External ear normal.  Left Ear: External ear normal.  Nose: Nose normal.  Mouth/Throat: Oropharynx is clear and moist.  Eyes: Conjunctivae are normal.  Neck: Neck supple. No thyromegaly present.  Cardiovascular: Normal rate, regular rhythm, normal heart sounds and intact distal pulses.   Pulmonary/Chest: Effort normal and breath sounds normal. No respiratory distress. She has no wheezes. She has no rales.  Lymphadenopathy:    She has no cervical adenopathy.          Assessment & Plan:  Her asthma has probably been worsened by the pollen outside. Add Symbicort bid. Get a CXR today

## 2012-11-13 NOTE — Progress Notes (Signed)
Quick Note:  I left a voice message for pt to return my call and released results in my chart. ______

## 2012-11-13 NOTE — Addendum Note (Signed)
Addended by: Gershon Crane A on: 11/13/2012 06:09 AM   Modules accepted: Orders

## 2012-11-15 ENCOUNTER — Telehealth: Payer: Self-pay | Admitting: Family Medicine

## 2012-11-15 NOTE — Telephone Encounter (Signed)
I spoke with pt about xray results.

## 2012-11-15 NOTE — Progress Notes (Signed)
Quick Note:  I spoke with pt ______ 

## 2012-11-16 NOTE — Addendum Note (Signed)
Addended by: Gershon Crane A on: 11/16/2012 12:59 PM   Modules accepted: Orders

## 2012-11-17 ENCOUNTER — Telehealth: Payer: Self-pay | Admitting: Family Medicine

## 2012-11-17 ENCOUNTER — Other Ambulatory Visit (INDEPENDENT_AMBULATORY_CARE_PROVIDER_SITE_OTHER): Payer: Medicare Other

## 2012-11-17 DIAGNOSIS — R0602 Shortness of breath: Secondary | ICD-10-CM

## 2012-11-17 LAB — BASIC METABOLIC PANEL
BUN: 13 mg/dL (ref 6–23)
CO2: 27 mEq/L (ref 19–32)
Chloride: 103 mEq/L (ref 96–112)
Creatinine, Ser: 0.8 mg/dL (ref 0.4–1.2)
Glucose, Bld: 89 mg/dL (ref 70–99)

## 2012-11-17 LAB — CBC WITH DIFFERENTIAL/PLATELET
Basophils Absolute: 0.1 10*3/uL (ref 0.0–0.1)
Basophils Relative: 0.7 % (ref 0.0–3.0)
Eosinophils Relative: 3.4 % (ref 0.0–5.0)
Hemoglobin: 14.4 g/dL (ref 12.0–15.0)
Lymphocytes Relative: 22.8 % (ref 12.0–46.0)
Monocytes Relative: 7.3 % (ref 3.0–12.0)
Neutro Abs: 4.9 10*3/uL (ref 1.4–7.7)
RBC: 4.8 Mil/uL (ref 3.87–5.11)

## 2012-11-17 NOTE — Telephone Encounter (Signed)
Left message on machine for patient to return our call 

## 2012-11-17 NOTE — Telephone Encounter (Signed)
Actually I have already referred her to Pulmonary. I think it is best for them to evaluate her anyway, then if they think a scan is needed they can order one

## 2012-11-17 NOTE — Telephone Encounter (Signed)
Patient is aware 

## 2012-11-17 NOTE — Telephone Encounter (Signed)
Patient called stating that her insurance denied her CT scan however she was advised that if her MD signs an authorization then her insurance will pay for a CT. Please assist.

## 2012-11-20 ENCOUNTER — Encounter: Payer: Self-pay | Admitting: Internal Medicine

## 2012-11-20 ENCOUNTER — Ambulatory Visit (INDEPENDENT_AMBULATORY_CARE_PROVIDER_SITE_OTHER): Payer: Medicare Other | Admitting: Internal Medicine

## 2012-11-20 VITALS — BP 118/90 | HR 74 | Temp 97.7°F | Ht 61.0 in | Wt 175.8 lb

## 2012-11-20 DIAGNOSIS — J9819 Other pulmonary collapse: Secondary | ICD-10-CM

## 2012-11-20 DIAGNOSIS — J339 Nasal polyp, unspecified: Secondary | ICD-10-CM

## 2012-11-20 DIAGNOSIS — J45909 Unspecified asthma, uncomplicated: Secondary | ICD-10-CM

## 2012-11-20 MED ORDER — MOMETASONE FURO-FORMOTEROL FUM 100-5 MCG/ACT IN AERO: INHALATION_SPRAY | RESPIRATORY_TRACT | Status: DC

## 2012-11-20 MED ORDER — PANTOPRAZOLE SODIUM 40 MG PO TBEC: 40.0000 mg | DELAYED_RELEASE_TABLET | Freq: Every day | ORAL | Status: DC

## 2012-11-20 MED ORDER — FAMOTIDINE 20 MG PO TABS: ORAL_TABLET | ORAL | Status: DC

## 2012-11-20 NOTE — Progress Notes (Signed)
  Subjective:    Patient ID: Aimee Lawrence, female    DOB: 02-03-1946   MRN: [id number]  HPI  82 yowf never smoker never childhood illnesses with some hoarseness in spring and fall dating back early 20's until retired around 46 and then developed intermittent wheezing ? Not seasonal used inhalers intermittently but then much worse breathing starting in Feb/March 2014 so referred 11/20/2012 to pulmonary clinic by Dr Clent Ridges for eval of ? Asthma? And abn cxr  11/20/2012 1st pulmonary cc sob p lunch and supper peaks around 10 pm assoc with loss of voice and burping, seemed worse at beach and worse with any inhalers assoc with dry cough.  No obvious daytime variabilty or assoc excess mucus production or cp or chest tightness, subjective wheeze overt sinus or hb symptoms. No unusual exp hx or h/o childhood pna/ asthma or premature birth to her knowledge.   Sleeping ok without nocturnal  or early am exacerbation  of respiratory  c/o's or need for noct saba. Also denies any obvious fluctuation of symptoms with weather or environmental changes or other aggravating or alleviating factors except as outlined above   Review of Systems  Constitutional: Negative for fever, chills and unexpected weight change.  HENT: Negative for ear pain, nosebleeds, congestion, sore throat, rhinorrhea, sneezing, trouble swallowing, dental problem, voice change, postnasal drip and sinus pressure.   Eyes: Negative for visual disturbance.  Respiratory: Positive for cough and shortness of breath. Negative for choking.   Cardiovascular: Positive for chest pain. Negative for leg swelling.  Gastrointestinal: Negative for vomiting, abdominal pain and diarrhea.  Genitourinary: Negative for difficulty urinating.  Musculoskeletal: Negative for arthralgias.  Skin: Negative for rash.  Neurological: Negative for tremors, syncope and headaches.  Hematological: Does not bruise/bleed easily.       Objective:   Physical Exam slt evasive amb  wf nad with nl vital signs  Wt Readings from Last 3 Encounters:  11/20/12 175 lb 12.8 oz (79.742 kg)  11/10/12 175 lb (79.379 kg)  07/20/12 186 lb (84.369 kg)     HEENT: nl dentition, turbinates, and orophanx. Nl external ear canals without cough reflex   NECK :  without JVD/Nodes/TM/ nl carotid upstrokes bilaterally   LUNGS: no acc muscle use, clear to A and P bilaterally without cough on insp or exp maneuvers   CV:  RRR  no s3 or murmur or increase in P2, no edema   ABD:  soft and nontender with nl excursion in the supine position. No bruits or organomegaly, bowel sounds nl  MS:  warm without deformities, calf tenderness, cyanosis or clubbing  SKIN: warm and dry without lesions    NEURO:  alert, approp, no deficits  cxr 11/15/12  New atelectasis at anterior lung bases, suspect right middle lobe.  Remaining lungs clear.  Follow-up exams until resolution recommended to exclude underlying  central obstructing lesion        Assessment & Plan:

## 2012-11-20 NOTE — Assessment & Plan Note (Signed)
Classic changes on cxr assoc with ? Chronic rhinitis/ asthma in never smoker   Discussed in detail all the  indications, usual  risks and alternatives  relative to the benefits with patient who agrees to proceed with conservative rx.

## 2012-11-20 NOTE — Assessment & Plan Note (Signed)
DDX of  difficult airways managment all start with A and  include Adherence, Ace Inhibitors, Acid Reflux, Active Sinus Disease, Alpha 1 Antitripsin deficiency, Anxiety masquerading as Airways dz,  ABPA,  allergy(esp in young), Aspiration (esp in elderly), Adverse effects of DPI,  Active smokers, plus two Bs  = Bronchiectasis and Beta blocker use..and one C= CHF  Adherence is always the initial "prime suspect" and is a multilayered concern that requires a "trust but verify" approach in every patient - starting with knowing how to use medications, especially inhalers, correctly, keeping up with refills and understanding the fundamental difference between maintenance and prns vs those medications only taken for a very short course and then stopped and not refilled. The proper method of use, as well as anticipated side effects, of a metered-dose inhaler are discussed and demonstrated to the patient. Improved effectiveness after extensive coaching during this visit to a level of approximately  50% from a basline of < 10% effective so will rx with dulera 100 2bid prn symptoms  ? Acid reflux strongly suspected on basis of burping and p supper but before bed being her worst perio of the day in absence of any noct or early am awakening > rec max acid suppression and diet then regroup

## 2012-11-20 NOTE — Patient Instructions (Addendum)
Pantoprazole (protonix) 40 mg   Take 30-60 min before first meal of the day and Pepcid 20 mg one bedtime until return to office - this is the best way to tell whether stomach acid is contributing to your problem.    Dulera 100 2 puffs every 12 hours if you feel it helps your breathing  GERD (REFLUX)  is an extremely common cause of respiratory symptoms, many times with no significant heartburn at all.    It can be treated with medication, but also with lifestyle changes including avoidance of late meals, excessive alcohol, smoking cessation, and avoid fatty foods, chocolate, peppermint, colas, red wine, and acidic juices such as orange juice.  NO MINT OR MENTHOL PRODUCTS SO NO COUGH DROPS  USE SUGARLESS CANDY INSTEAD (jolley ranchers or Stover's)  NO OIL BASED VITAMINS - use powdered substitutes.    Please schedule a follow up office visit in 4 weeks, sooner if needed with PFT's and cxr

## 2012-12-20 ENCOUNTER — Other Ambulatory Visit: Payer: Self-pay | Admitting: Internal Medicine

## 2012-12-20 MED ORDER — PANTOPRAZOLE SODIUM 40 MG PO TBEC: 40.0000 mg | DELAYED_RELEASE_TABLET | Freq: Every day | ORAL | Status: DC

## 2012-12-29 ENCOUNTER — Ambulatory Visit (INDEPENDENT_AMBULATORY_CARE_PROVIDER_SITE_OTHER): Payer: Medicare Other | Admitting: Internal Medicine

## 2012-12-29 ENCOUNTER — Encounter: Payer: Self-pay | Admitting: Internal Medicine

## 2012-12-29 ENCOUNTER — Ambulatory Visit (INDEPENDENT_AMBULATORY_CARE_PROVIDER_SITE_OTHER)
Admission: RE | Admit: 2012-12-29 | Discharge: 2012-12-29 | Disposition: A | Discharge status: Home / Self Care | Payer: Medicare Other | Source: Ambulatory Visit | Attending: Internal Medicine | Admitting: Internal Medicine

## 2012-12-29 VITALS — BP 120/80 | HR 65 | Temp 97.0°F | Ht 60.0 in | Wt 167.0 lb

## 2012-12-29 DIAGNOSIS — J45909 Unspecified asthma, uncomplicated: Secondary | ICD-10-CM

## 2012-12-29 DIAGNOSIS — J9819 Other pulmonary collapse: Secondary | ICD-10-CM

## 2012-12-29 LAB — PULMONARY FUNCTION TEST

## 2012-12-29 NOTE — Progress Notes (Signed)
PFT done today. 

## 2012-12-29 NOTE — Progress Notes (Signed)
Subjective:    Patient ID: Aimee Lawrence, female    DOB: [birth year]   MRN: [id number]  HPI  89 yowf never smoker never childhood illnesses with some hoarseness in spring and fall dating back early 20's until retired around 73 and then developed intermittent wheezing ? Not seasonal used inhalers intermittently but then much worse breathing starting in Feb/March 2014 so referred 11/20/2012 to pulmonary clinic by Dr Clent Ridges for eval of ? Asthma? And abn cxr  11/20/2012 1st pulmonary cc sob p lunch and supper peaks around 10 pm assoc with loss of voice and burping, seemed worse at beach and worse with any inhalers assoc with dry cough. rec Pantoprazole (protonix) 40 mg   Take 30-60 min before first meal of the day and Pepcid 20 mg one bedtime until return to office - this is the best way to tell whether stomach acid is contributing to your problem.   Dulera 100 2 puffs every 12 hours if you feel it helps your breathing> did not use GERD diet  12/29/2012 f/u ov/Bob Daversa re rml syndrome/ chronic cough Chief Complaint  Patient presents with  . Followup with PFT    Pt states SOB, CP and throat clearing have improved since last visit.   feeling 100% better x burping,   No obvious daytime variabilty or cough or cp or chest tightness, subjective wheeze overt sinus or hb symptoms. No unusual exp hx or h/o childhood pna/ asthma or premature birth to her knowledge.   Sleeping ok without nocturnal  or early am exacerbation  of respiratory  c/o's or need for noct saba. Also denies any obvious fluctuation of symptoms with weather or environmental changes or other aggravating or alleviating factors except as outlined above   Current Medications, Allergies, Past Medical History, Past Surgical History, Family History, and Social History were reviewed in [employer] record.  ROS  The following are not active complaints unless bolded sore throat, dysphagia, dental problems, itching, sneezing,  nasal  congestion or excess/ purulent secretions, ear ache,   fever, chills, sweats, unintended wt loss, pleuritic or exertional cp, hemoptysis,  orthopnea pnd or leg swelling, presyncope, palpitations, heartburn, abdominal pain, anorexia, nausea, vomiting, diarrhea  or change in bowel or urinary habits, change in stools or urine, dysuria,hematuria,  rash, arthralgias, visual complaints, headache, numbness weakness or ataxia or problems with walking or coordination,  change in mood/affect or memory.          Objective:   Physical Exam slt evasive amb wf nad with nl vital signs  12/29/2012        167 Wt Readings from Last 3 Encounters:  11/20/12 175 lb 12.8 oz (79.742 kg)  11/10/12 175 lb (79.379 kg)  07/20/12 186 lb (84.369 kg)     HEENT: nl dentition, turbinates, and orophanx. Nl external ear canals without cough reflex   NECK :  without JVD/Nodes/TM/ nl carotid upstrokes bilaterally   LUNGS: no acc muscle use, clear to A and P bilaterally without cough on insp or exp maneuvers   CV:  RRR  no s3 or murmur or increase in P2, no edema   ABD:  soft and nontender with nl excursion in the supine position. No bruits or organomegaly, bowel sounds nl  MS:  warm without deformities, calf tenderness, cyanosis or clubbing  SKIN: warm and dry without lesions    NEURO:  alert, approp, no deficits   CXR  12/29/2012 :   No acute cardiopulmonary disease.  Assessment & Plan:

## 2012-12-29 NOTE — Patient Instructions (Addendum)
If any respiratory problems occur that don't respond to the acid suppression go ahead and try dulera 100 2 pffs every 12 hours  Work on inhaler technique:  relax and gently blow all the way out then take a nice smooth deep breath back in, triggering the inhaler at same time you start breathing in.  Hold for up to 5 seconds if you can.  Rinse and gargle with water when done   .    Please schedule a follow up visit in 3 months but call sooner if needed

## 2012-12-30 NOTE — Assessment & Plan Note (Signed)
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

## 2012-12-30 NOTE — Assessment & Plan Note (Addendum)
-   hfa 50% p coaching 11/20/2012  - PFT's 12/29/2012  1.28 (66%) ratio 69 and 13% better p B2 > normalizes and nl dlco 117  She clearly has at least mild asthma but only symptoms now are GI related.  Ok to use dulera if any of her symptoms aren't addressed by gerd rx  The proper method of use, as well as anticipated side effects, of a metered-dose inhaler are discussed and demonstrated to the patient. Improved effectiveness after extensive coaching during this visit to a level of approximately  75% > dulera 100 2 q 12 prn best option since not clear what if any resp symptoms are related to primary asthma

## 2013-01-19 ENCOUNTER — Encounter: Payer: Self-pay | Admitting: Internal Medicine

## 2013-01-19 NOTE — Progress Notes (Signed)
Quick Note:  Letter mailed to pt to call for results ______

## 2013-01-30 ENCOUNTER — Telehealth: Payer: Self-pay | Admitting: Internal Medicine

## 2013-01-30 NOTE — Telephone Encounter (Signed)
Notes Recorded by Christen Butter, CMA on 01/19/2013 at 2:12 PM Letter mailed to pt to call for results ------  Notes Recorded by Christen Butter, CMA on 01/10/2013 at 4:39 PM [facility] ------  Notes Recorded by Christen Butter, CMA on 01/02/2013 at 12:30 PM [facility] ------  Notes Recorded by Nyoka Cowden, MD on 12/29/2012 at 9:04 PM Call pt: Reviewed cxr and no acute change so no change in recommendations made at ov      Pt is aware. Carron Curie, CMA

## 2013-02-14 ENCOUNTER — Other Ambulatory Visit: Payer: Self-pay

## 2013-02-20 ENCOUNTER — Telehealth: Payer: Self-pay | Admitting: Family Medicine

## 2013-02-20 NOTE — Telephone Encounter (Signed)
The patient stopped by the office today.  She had a scrape on the back of her rt knee.  Cleaned it with saline, applied topical antibacterial ointment and placed a bandage over it.  Made sure pt is up to date on TD/Tdap.  Last was in 2011.  Instructed her to try OTC neosporin etc.  Call back if worsening.

## 2013-03-26 ENCOUNTER — Ambulatory Visit: Payer: Medicare Other | Admitting: Internal Medicine

## 2013-03-28 ENCOUNTER — Encounter: Payer: Self-pay | Admitting: Internal Medicine

## 2013-03-28 ENCOUNTER — Ambulatory Visit (INDEPENDENT_AMBULATORY_CARE_PROVIDER_SITE_OTHER): Payer: Medicare Other | Admitting: Internal Medicine

## 2013-03-28 VITALS — BP 106/64 | HR 77 | Temp 98.8°F | Ht 60.25 in | Wt 154.2 lb

## 2013-03-28 DIAGNOSIS — Z23 Encounter for immunization: Secondary | ICD-10-CM

## 2013-03-28 DIAGNOSIS — J9819 Other pulmonary collapse: Secondary | ICD-10-CM

## 2013-03-28 DIAGNOSIS — J45909 Unspecified asthma, uncomplicated: Secondary | ICD-10-CM

## 2013-03-28 NOTE — Progress Notes (Signed)
Subjective:    Patient ID: Aimee Lawrence, female    DOB: September 23, 1945   MRN: [id number]    Brief patient profile:  11 yowf never smoker never childhood illnesses with some hoarseness in spring and fall dating back early 20's until retired around 49 and then developed intermittent wheezing ? Not seasonal used inhalers intermittently but then much worse breathing starting in Feb/March 2014 so referred 11/20/2012 to pulmonary clinic by Dr Clent Ridges for eval of ? Asthma? And abn cxr c/w RML syndrome   History of Present Illness  11/20/2012 1st pulmonary cc sob p lunch and supper peaks around 10 pm assoc with loss of voice and burping, seemed worse at beach and worse with any inhalers assoc with dry cough. rec Pantoprazole (protonix) 40 mg   Take 30-60 min before first meal of the day and Pepcid 20 mg one bedtime until return to office - this is the best way to tell whether stomach acid is contributing to your problem.   Dulera 100 2 puffs every 12 hours if you feel it helps your breathing> did not use GERD diet  12/29/2012 f/u ov/Aimee Lawrence re rml syndrome/ chronic cough Chief Complaint  Patient presents with  . Followup with PFT    Pt states SOB, CP and throat clearing have improved since last visit.   feeling 100% better x burping,  rec If any respiratory problems occur that don't respond to the acid suppression go ahead and try dulera 100 2 pffs every 12 hours> never needed  Work on inhaler technique  03/28/2013 f/u ov/Aimee Lawrence re: sob/ cough gone to her satisfaction no inhalers  Chief Complaint  Patient presents with  . Follow-up    Pt states her breathing is doing well with no need for dulera or albuterol inhalers. No new co's today.    min burping, not limited by breathing.   No obvious daytime variabilty or cough or cp or chest tightness, subjective wheeze overt sinus or hb symptoms. No unusual exp hx or h/o childhood pna/ asthma or premature birth to her knowledge.   Sleeping ok without nocturnal  or  early am exacerbation  of respiratory  c/o's or need for noct saba. Also denies any obvious fluctuation of symptoms with weather or environmental changes or other aggravating or alleviating factors except as outlined above   Current Medications, Allergies, Past Medical History, Past Surgical History, Family History, and Social History were reviewed in [employer] record.  ROS  The following are not active complaints unless bolded sore throat, dysphagia, dental problems, itching, sneezing,  nasal congestion or excess/ purulent secretions, ear ache,   fever, chills, sweats, unintended wt loss, pleuritic or exertional cp, hemoptysis,  orthopnea pnd or leg swelling, presyncope, palpitations, heartburn, abdominal pain, anorexia, nausea, vomiting, diarrhea  or change in bowel or urinary habits, change in stools or urine, dysuria,hematuria,  rash, arthralgias, visual complaints, headache, numbness weakness or ataxia or problems with walking or coordination,  change in mood/affect or memory.          Objective:   Physical Exam  Pleasant  amb wf nad with nl vital signs  12/29/2012        167 > 154 03/29/2013  Wt Readings from Last 3 Encounters:  11/20/12 175 lb 12.8 oz (79.742 kg)  11/10/12 175 lb (79.379 kg)  07/20/12 186 lb (84.369 kg)     HEENT: nl dentition, turbinates, and orophanx. Nl external ear canals without cough reflex   NECK :  without  JVD/Nodes/TM/ nl carotid upstrokes bilaterally   LUNGS: no acc muscle use, clear to A and P bilaterally without cough on insp or exp maneuvers   CV:  RRR  no s3 or murmur or increase in P2, no edema   ABD:  soft and nontender with nl excursion in the supine position. No bruits or organomegaly, bowel sounds nl  MS:  warm without deformities, calf tenderness, cyanosis or clubbing  SKIN: warm and dry without lesions    NEURO:  alert, approp, no deficits   CXR  12/29/2012 :   No acute cardiopulmonary disease.         Assessment & Plan:

## 2013-03-28 NOTE — Patient Instructions (Addendum)
Please schedule a follow up visit in 3 months but call sooner if needed with cxr  

## 2013-03-29 NOTE — Assessment & Plan Note (Signed)
-   PFT's 12/29/2012  1.28 (66%) ratio 69 and 13% better p B2 > normalizes and nl dlco 117  Her upper airway problems were the likely cause of her symptoms and at this point not feeling the need for even saba so ok to leave off dulera and just continue rx for GERD

## 2013-03-29 NOTE — Assessment & Plan Note (Signed)
Reviewed again the pathophysiology of RML syndrome and rec final f/u cxr in 06/2013 as prev discussed

## 2013-04-02 ENCOUNTER — Ambulatory Visit (INDEPENDENT_AMBULATORY_CARE_PROVIDER_SITE_OTHER): Payer: Medicare Other | Admitting: Family Medicine

## 2013-04-02 ENCOUNTER — Encounter: Payer: Self-pay | Admitting: Family Medicine

## 2013-04-02 VITALS — BP 118/66 | HR 76 | Temp 98.3°F | Wt 152.0 lb

## 2013-04-02 DIAGNOSIS — M722 Plantar fascial fibromatosis: Secondary | ICD-10-CM

## 2013-04-02 NOTE — Progress Notes (Signed)
  Subjective:    Patient ID: Aimee Lawrence, female    DOB: 06-Mar-1946, 67 y.o.   MRN: [id number]  HPI Here for several weeks of worsening pain in the bottoms of both feet, right being worse than left. No swelling. Using Tylenol prn. She started wearing a pair of rocker bottom shoes a month ago. No recent trauma.    Review of Systems  Constitutional: Negative.   Musculoskeletal: Positive for arthralgias and gait problem.       Objective:   Physical Exam  Constitutional: She appears well-developed and well-nourished. No distress.  Musculoskeletal:  Very tender along the medial arch and the heel of the right foot, less tender in the left foot. No swelling.           Assessment & Plan:  Use hot soaks with Epsom salts. She needs to wear more supportive shoes like a running shoe. She can use gel arch inserts as well to support the arch.

## 2013-07-24 ENCOUNTER — Telehealth: Payer: Self-pay | Admitting: Internal Medicine

## 2013-07-24 NOTE — Telephone Encounter (Signed)
called patient to make apt x3. No return call back. Sent letter 1/13. °

## 2014-04-26 ENCOUNTER — Other Ambulatory Visit: Payer: Self-pay

## 2014-05-28 ENCOUNTER — Ambulatory Visit (INDEPENDENT_AMBULATORY_CARE_PROVIDER_SITE_OTHER): Payer: Medicare Other | Admitting: Family Medicine

## 2014-05-28 ENCOUNTER — Encounter: Payer: Self-pay | Admitting: Family Medicine

## 2014-05-28 VITALS — BP 120/70 | HR 73 | Temp 98.2°F | Ht 60.25 in | Wt 150.0 lb

## 2014-05-28 DIAGNOSIS — Z23 Encounter for immunization: Secondary | ICD-10-CM

## 2014-05-28 DIAGNOSIS — I10 Essential (primary) hypertension: Secondary | ICD-10-CM

## 2014-05-28 DIAGNOSIS — Z0000 Encounter for general adult medical examination without abnormal findings: Secondary | ICD-10-CM

## 2014-05-28 LAB — CBC WITH DIFFERENTIAL/PLATELET
Basophils Absolute: 0 10*3/uL (ref 0.0–0.1)
Basophils Relative: 0.4 % (ref 0.0–3.0)
EOS PCT: 5.8 % — AB (ref 0.0–5.0)
Eosinophils Absolute: 0.3 10*3/uL (ref 0.0–0.7)
HCT: 39.8 % (ref 36.0–46.0)
Hemoglobin: 13.3 g/dL (ref 12.0–15.0)
LYMPHS PCT: 23.1 % (ref 12.0–46.0)
Lymphs Abs: 1.4 10*3/uL (ref 0.7–4.0)
MCHC: 33.5 g/dL (ref 30.0–36.0)
MCV: 88.4 fl (ref 78.0–100.0)
Monocytes Absolute: 0.4 10*3/uL (ref 0.1–1.0)
Monocytes Relative: 7 % (ref 3.0–12.0)
NEUTROS PCT: 63.7 % (ref 43.0–77.0)
Neutro Abs: 3.8 10*3/uL (ref 1.4–7.7)
Platelets: 218 10*3/uL (ref 150.0–400.0)
RBC: 4.49 Mil/uL (ref 3.87–5.11)
RDW: 14 % (ref 11.5–15.5)
WBC: 6 10*3/uL (ref 4.0–10.5)

## 2014-05-28 LAB — POCT URINALYSIS DIPSTICK
BILIRUBIN UA: NEGATIVE
GLUCOSE UA: NEGATIVE
Ketones, UA: NEGATIVE
NITRITE UA: NEGATIVE
Protein, UA: NEGATIVE
RBC UA: NEGATIVE
Spec Grav, UA: 1.02
Urobilinogen, UA: 0.2
pH, UA: 6

## 2014-05-28 LAB — BASIC METABOLIC PANEL
BUN: 22 mg/dL (ref 6–23)
CALCIUM: 9.3 mg/dL (ref 8.4–10.5)
CO2: 20 mEq/L (ref 19–32)
Chloride: 109 mEq/L (ref 96–112)
Creatinine, Ser: 0.8 mg/dL (ref 0.4–1.2)
GFR: 75.25 mL/min (ref >60.00)
GLUCOSE: 83 mg/dL (ref 70–99)
Potassium: 4.6 mEq/L (ref 3.5–5.1)
SODIUM: 144 meq/L (ref 135–145)

## 2014-05-28 LAB — LIPID PANEL
CHOLESTEROL: 232 mg/dL — AB (ref 0–200)
HDL: 71.3 mg/dL (ref >39.00)
LDL CALC: 150 mg/dL — AB (ref 0–99)
NonHDL: 160.7
TRIGLYCERIDES: 56 mg/dL (ref 0.0–149.0)
Total CHOL/HDL Ratio: 3
VLDL: 11.2 mg/dL (ref 0.0–40.0)

## 2014-05-28 LAB — TSH: TSH: 0.85 u[IU]/mL (ref 0.35–4.50)

## 2014-05-28 LAB — HEPATIC FUNCTION PANEL
ALK PHOS: 101 U/L (ref 39–117)
ALT: 35 U/L (ref 0–35)
AST: 33 U/L (ref 0–37)
Albumin: 4.3 g/dL (ref 3.5–5.2)
BILIRUBIN DIRECT: 0.1 mg/dL (ref 0.0–0.3)
Total Bilirubin: 0.4 mg/dL (ref 0.2–1.2)
Total Protein: 7.5 g/dL (ref 6.0–8.3)

## 2014-05-28 MED ORDER — ALBUTEROL SULFATE HFA 108 (90 BASE) MCG/ACT IN AERS: 2.0000 | INHALATION_SPRAY | RESPIRATORY_TRACT | Status: DC | PRN

## 2014-05-28 MED ORDER — FLUTICASONE PROPIONATE 50 MCG/ACT NA SUSP: 2.0000 | Freq: Every day | NASAL | Status: DC

## 2014-05-28 NOTE — Progress Notes (Signed)
   Subjective:    Patient ID: Aimee Lawrence, female    DOB: 1945-12-22, 68 y.o.   MRN: [id number]  HPI 68 yr old female for a cpx. She feels well and has no concerns.    Review of Systems  Constitutional: Negative.   HENT: Negative.   Eyes: Negative.   Respiratory: Negative.   Cardiovascular: Negative.   Gastrointestinal: Negative.   Genitourinary: Negative for dysuria, urgency, frequency, hematuria, flank pain, decreased urine volume, enuresis, difficulty urinating, pelvic pain and dyspareunia.  Musculoskeletal: Negative.   Skin: Negative.   Neurological: Negative.   Psychiatric/Behavioral: Negative.        Objective:   Physical Exam  Constitutional: She is oriented to person, place, and time. She appears well-developed and well-nourished. No distress.  HENT:  Head: Normocephalic and atraumatic.  Right Ear: External ear normal.  Left Ear: External ear normal.  Nose: Nose normal.  Mouth/Throat: Oropharynx is clear and moist. No oropharyngeal exudate.  Eyes: Conjunctivae and EOM are normal. Pupils are equal, round, and reactive to light. No scleral icterus.  Neck: Normal range of motion. Neck supple. No JVD present. No thyromegaly present.  Cardiovascular: Normal rate, regular rhythm, normal heart sounds and intact distal pulses.  Exam reveals no gallop and no friction rub.   No murmur heard. EKG normal   Pulmonary/Chest: Effort normal and breath sounds normal. No respiratory distress. She has no wheezes. She has no rales. She exhibits no tenderness.  Abdominal: Soft. Bowel sounds are normal. She exhibits no distension and no mass. There is no tenderness. There is no rebound and no guarding.  Musculoskeletal: Normal range of motion. She exhibits no edema or tenderness.  Lymphadenopathy:    She has no cervical adenopathy.  Neurological: She is alert and oriented to person, place, and time. She has normal reflexes. No cranial nerve deficit. She exhibits normal muscle tone.  Coordination normal.  Skin: Skin is warm and dry. No rash noted. No erythema.  Psychiatric: She has a normal mood and affect. Her behavior is normal. Judgment and thought content normal.          Assessment & Plan:  Well exam. Get fasting labs. Set up a DEXA. She will get a mammogram.

## 2014-05-28 NOTE — Progress Notes (Signed)
Pre visit review using our clinic review tool, if applicable. No additional management support is needed unless otherwise documented below in the visit note. 

## 2014-07-30 ENCOUNTER — Ambulatory Visit (INDEPENDENT_AMBULATORY_CARE_PROVIDER_SITE_OTHER): Payer: Medicare Other | Admitting: Family Medicine

## 2014-07-30 ENCOUNTER — Encounter: Payer: Self-pay | Admitting: Family Medicine

## 2014-07-30 VITALS — BP 109/69 | HR 79 | Temp 98.3°F | Ht 60.25 in | Wt 152.0 lb

## 2014-07-30 DIAGNOSIS — J0100 Acute maxillary sinusitis, unspecified: Secondary | ICD-10-CM | POA: Diagnosis not present

## 2014-07-30 MED ORDER — AZITHROMYCIN 250 MG PO TABS: ORAL_TABLET | ORAL | Status: DC

## 2014-07-30 MED ORDER — HYDROCODONE-HOMATROPINE 5-1.5 MG/5ML PO SYRP: 5.0000 mL | ORAL_SOLUTION | ORAL | Status: DC | PRN

## 2014-07-30 NOTE — Progress Notes (Signed)
   Subjective:    Patient ID: Aimee Lawrence, female    DOB: 1945/11/11, 69 y.o.   MRN: [id number]  HPI Here for one week of sinus pressure, PND, ST, and coughing up green sputum. On Nyquil.    Review of Systems  Constitutional: Negative.   HENT: Positive for congestion, postnasal drip and sinus pressure.   Eyes: Negative.   Respiratory: Positive for cough.        Objective:   Physical Exam  Constitutional: She appears well-developed and well-nourished.  HENT:  Right Ear: External ear normal.  Left Ear: External ear normal.  Nose: Nose normal.  Mouth/Throat: Oropharynx is clear and moist.  Eyes: Conjunctivae are normal.  Pulmonary/Chest: Effort normal and breath sounds normal.  Lymphadenopathy:    She has no cervical adenopathy.          Assessment & Plan:  Add Mucinex.

## 2014-07-30 NOTE — Progress Notes (Signed)
Pre visit review using our clinic review tool, if applicable. No additional management support is needed unless otherwise documented below in the visit note. 

## 2014-10-14 ENCOUNTER — Ambulatory Visit (INDEPENDENT_AMBULATORY_CARE_PROVIDER_SITE_OTHER): Payer: Medicare Other | Admitting: Internal Medicine

## 2014-10-14 ENCOUNTER — Encounter: Payer: Self-pay | Admitting: Internal Medicine

## 2014-10-14 ENCOUNTER — Other Ambulatory Visit: Payer: Self-pay

## 2014-10-14 DIAGNOSIS — I10 Essential (primary) hypertension: Secondary | ICD-10-CM | POA: Diagnosis not present

## 2014-10-14 DIAGNOSIS — L259 Unspecified contact dermatitis, unspecified cause: Secondary | ICD-10-CM | POA: Diagnosis not present

## 2014-10-14 DIAGNOSIS — Z1231 Encounter for screening mammogram for malignant neoplasm of breast: Secondary | ICD-10-CM

## 2014-10-14 MED ORDER — METHYLPREDNISOLONE ACETATE 80 MG/ML IJ SUSP
80.0000 mg | Freq: Once | INTRAMUSCULAR | Status: AC
  Administered 2014-10-14: 80 mg via INTRAMUSCULAR

## 2014-10-14 NOTE — Progress Notes (Signed)
Subjective:    Patient ID: Aimee Lawrence, female    DOB: 1946-02-19, 69 y.o.   MRN: [id number]  HPI 69 year old patient who has a history of essential hypertension.  She has done outdoor yard activities over the weekend and has developed a pruritic rash involving her right facial and right arm area.  Past Medical History  Diagnosis Date  . Osteopenia     last dexa in 2009 in [place]  . Allergy   . Asthma   . Hx of colonic polyps   . Depression     History   Social History  . Marital Status: Single    Spouse Name: N/A  . Number of Children: N/A  . Years of Education: N/A   Occupational History  . Retired Pharmacist, hospital    Social History Main Topics  . Smoking status: Never Smoker   . Smokeless tobacco: Never Used  . Alcohol Use: 0.0 oz/week    0 Standard drinks or equivalent per week     Comment: rare  . Drug Use: No  . Sexual Activity: Not on file   Other Topics Concern  . Not on file   Social History Narrative   Retired   Single   Regular exercise- no          Past Surgical History  Procedure Laterality Date  . Nasal polyp surgery  2007  . Colonoscopy  07-20-12    per Dr. Carlean Purl, adenomatous polyps, repeat in 3 yrs   . Cholecystectomy  2007    lap choli  . Sinus endo w/fusion  06/27/2012    Procedure: ENDOSCOPIC SINUS SURGERY WITH FUSION NAVIGATION;  Surgeon: Rozetta Nunnery, MD;  Location: [facility];  Service: ENT;  Laterality: N/A;  FESS FUSION PROTOCOL    Family History  Problem Relation Age of Onset  . Alcohol abuse    . Arthritis    . Breast cancer Mother   . Diabetes    . Heart disease    . Colon cancer Neg Hx   . Stomach cancer Neg Hx   . Pneumonia Mother     Allergies  Allergen Reactions  . Aleve [Naproxen Sodium]     Short of breath  . Aspirin     Trouble breathing  . Erythromycin Nausea And Vomiting  . Ibuprofen     Short of breath  . Ampicillin Rash    Current Outpatient Prescriptions on File Prior to Visit    Medication Sig Dispense Refill  . albuterol (PROAIR HFA) 108 (90 BASE) MCG/ACT inhaler Inhale 2 puffs into the lungs every 4 (four) hours as needed for wheezing or shortness of breath. 1 Inhaler 11  . Ascorbic Acid (VITAMIN C CR PO) Take 1 tablet by mouth daily.     . Calcium Carbonate (CALCIUM 600 PO) Take 1 tablet by mouth daily.     . Cetirizine HCl (ZYRTEC PO) Take by mouth daily.    . Cholecalciferol (VITAMIN D-3 PO) Take 1 tablet by mouth daily.     Marland Kitchen FIBER FORMULA CAPS Take 1 tablet by mouth daily.     . fluticasone (FLONASE) 50 MCG/ACT nasal spray Place 2 sprays into both nostrils daily. 16 g 11   No current facility-administered medications on file prior to visit.    BP 122/70 mmHg  Pulse 77  Temp(Src) 98.1 F (36.7 C) (Oral)  Resp 20  Ht 5' 0.25" (1.53 m)  Wt 159 lb (72.122 kg)  BMI 30.81 kg/m2  SpO2  98%      Review of Systems  Skin: Positive for rash.       Objective:   Physical Exam  Skin: Rash noted.  Patchy areas of erythema involving the right upper arm and right facial area consistent with a contact dermatitis          Assessment & Plan:   Contact dermatitis.  Will continue oral antihistamines.  Will treat with Depo-Medrol 80 Hypertension, well-controlled

## 2014-10-14 NOTE — Addendum Note (Signed)
Addended by: Marian Sorrow on: 10/14/2014 04:25 PM   Modules accepted: Orders

## 2014-10-14 NOTE — Progress Notes (Signed)
Pre visit review using our clinic review tool, if applicable. No additional management support is needed unless otherwise documented below in the visit note. 

## 2014-10-14 NOTE — Patient Instructions (Signed)

## 2014-10-15 ENCOUNTER — Telehealth: Payer: Self-pay | Admitting: Family Medicine

## 2014-10-15 MED ORDER — PREDNISONE (PAK) 10 MG PO TABS: ORAL_TABLET | ORAL | Status: DC

## 2014-10-15 NOTE — Telephone Encounter (Signed)
Notify patient that she receive Depo-Medrol-this was the steroid product referred to.  If no improvement in 5 days. have her call back prior to weekend for consideration of a prednisone Dosepak

## 2014-10-15 NOTE — Telephone Encounter (Signed)
Pt was seen yesterday and is waiting on prednisone to be call [facility]

## 2014-10-15 NOTE — Telephone Encounter (Signed)
Discussed with Dr.K, okay to give pt Prednisone dose pack. Rx printed and signed and given to pt.

## 2014-10-15 NOTE — Telephone Encounter (Signed)
Dr.K, was pt suppose to get a Rx for Prednisone?

## 2014-10-15 NOTE — Telephone Encounter (Signed)
Patient came in the office about getting a prescription for the steroid dose pack. Notify the patient that she receive a steroid shot on her visit. Patient states that she will be going on vacation and would like to have a prescription for this medicine.

## 2014-10-30 ENCOUNTER — Ambulatory Visit: Payer: Medicare Other

## 2014-11-14 ENCOUNTER — Encounter (INDEPENDENT_AMBULATORY_CARE_PROVIDER_SITE_OTHER): Payer: Self-pay

## 2014-11-14 ENCOUNTER — Ambulatory Visit
Admission: RE | Admit: 2014-11-14 | Discharge: 2014-11-14 | Disposition: A | Discharge status: Home / Self Care | Payer: Medicare Other | Source: Ambulatory Visit

## 2014-11-14 DIAGNOSIS — Z1231 Encounter for screening mammogram for malignant neoplasm of breast: Secondary | ICD-10-CM

## 2015-01-28 DIAGNOSIS — L309 Dermatitis, unspecified: Secondary | ICD-10-CM | POA: Diagnosis not present

## 2015-01-28 DIAGNOSIS — D225 Melanocytic nevi of trunk: Secondary | ICD-10-CM | POA: Diagnosis not present

## 2015-01-28 DIAGNOSIS — L814 Other melanin hyperpigmentation: Secondary | ICD-10-CM | POA: Diagnosis not present

## 2015-01-28 DIAGNOSIS — L821 Other seborrheic keratosis: Secondary | ICD-10-CM | POA: Diagnosis not present

## 2015-02-03 ENCOUNTER — Encounter: Payer: Self-pay | Admitting: Family Medicine

## 2015-02-03 ENCOUNTER — Ambulatory Visit (INDEPENDENT_AMBULATORY_CARE_PROVIDER_SITE_OTHER): Payer: Medicare Other | Admitting: Family Medicine

## 2015-02-03 VITALS — BP 118/64 | HR 87 | Temp 98.8°F | Ht 60.25 in | Wt 158.0 lb

## 2015-02-03 DIAGNOSIS — M791 Myalgia: Secondary | ICD-10-CM | POA: Diagnosis not present

## 2015-02-03 DIAGNOSIS — I10 Essential (primary) hypertension: Secondary | ICD-10-CM | POA: Diagnosis not present

## 2015-02-03 DIAGNOSIS — M609 Myositis, unspecified: Secondary | ICD-10-CM

## 2015-02-03 DIAGNOSIS — IMO0001 Reserved for inherently not codable concepts without codable children: Secondary | ICD-10-CM

## 2015-02-03 MED ORDER — TRAMADOL HCL 50 MG PO TABS: 100.0000 mg | ORAL_TABLET | Freq: Three times a day (TID) | ORAL | Status: DC | PRN

## 2015-02-03 MED ORDER — ALBUTEROL SULFATE HFA 108 (90 BASE) MCG/ACT IN AERS: 2.0000 | INHALATION_SPRAY | RESPIRATORY_TRACT | Status: DC | PRN

## 2015-02-03 NOTE — Progress Notes (Signed)
   Subjective:    Patient ID: Aimee Lawrence, female    DOB: 30-Dec-1945, 69 y.o.   MRN: [id number]  HPI Here for 6 weeks of intermittent muscle pains in both legs. There is no swelling. No hx of trauma but the pain did start after she spent a few hours helping a friend carry some lumber up some steps. Tylenol helps a little bit.    Review of Systems  Constitutional: Negative.   Respiratory: Negative.   Cardiovascular: Negative.   Musculoskeletal: Positive for myalgias. Negative for back pain, joint swelling and arthralgias.       Objective:   Physical Exam  Constitutional: She appears well-developed and well-nourished. No distress.  Neck: No thyromegaly present.  Cardiovascular: Normal rate, regular rhythm, normal heart sounds and intact distal pulses.   Pulmonary/Chest: Effort normal and breath sounds normal. No respiratory distress. She has no wheezes.  Musculoskeletal:  The legs are normal on exam, no swelling or tenderness   Lymphadenopathy:    She has no cervical adenopathy.          Assessment & Plan:  Leg pains. Try tramadol as needed. Get labs to check for etiologies.

## 2015-02-03 NOTE — Progress Notes (Signed)
Pre visit review using our clinic review tool, if applicable. No additional management support is needed unless otherwise documented below in the visit note. 

## 2015-02-04 ENCOUNTER — Other Ambulatory Visit (INDEPENDENT_AMBULATORY_CARE_PROVIDER_SITE_OTHER): Payer: Medicare Other

## 2015-02-04 ENCOUNTER — Ambulatory Visit: Payer: Medicare Other | Admitting: Family Medicine

## 2015-02-04 DIAGNOSIS — IMO0001 Reserved for inherently not codable concepts without codable children: Secondary | ICD-10-CM

## 2015-02-04 DIAGNOSIS — I10 Essential (primary) hypertension: Secondary | ICD-10-CM

## 2015-02-04 DIAGNOSIS — M791 Myalgia: Secondary | ICD-10-CM | POA: Diagnosis not present

## 2015-02-04 DIAGNOSIS — M609 Myositis, unspecified: Secondary | ICD-10-CM

## 2015-02-04 LAB — TSH: TSH: 1.04 u[IU]/mL (ref 0.35–4.50)

## 2015-02-04 LAB — CBC WITH DIFFERENTIAL/PLATELET
BASOS ABS: 0 10*3/uL (ref 0.0–0.1)
Basophils Relative: 0.8 % (ref 0.0–3.0)
EOS ABS: 0.3 10*3/uL (ref 0.0–0.7)
Eosinophils Relative: 5.7 % — ABNORMAL HIGH (ref 0.0–5.0)
HCT: 39.2 % (ref 36.0–46.0)
HEMOGLOBIN: 13.1 g/dL (ref 12.0–15.0)
LYMPHS ABS: 1.5 10*3/uL (ref 0.7–4.0)
LYMPHS PCT: 29.2 % (ref 12.0–46.0)
MCHC: 33.5 g/dL (ref 30.0–36.0)
MCV: 91.1 fl (ref 78.0–100.0)
MONOS PCT: 8.8 % (ref 3.0–12.0)
Monocytes Absolute: 0.4 10*3/uL (ref 0.1–1.0)
NEUTROS ABS: 2.8 10*3/uL (ref 1.4–7.7)
NEUTROS PCT: 55.5 % (ref 43.0–77.0)
Platelets: 216 10*3/uL (ref 150.0–400.0)
RBC: 4.3 Mil/uL (ref 3.87–5.11)
RDW: 13.7 % (ref 11.5–15.5)
WBC: 5.1 10*3/uL (ref 4.0–10.5)

## 2015-02-04 LAB — BASIC METABOLIC PANEL
BUN: 13 mg/dL (ref 6–23)
CHLORIDE: 104 meq/L (ref 96–112)
CO2: 28 mEq/L (ref 19–32)
Calcium: 9.3 mg/dL (ref 8.4–10.5)
Creatinine, Ser: 0.76 mg/dL (ref 0.40–1.20)
GFR: 79.68 mL/min (ref >60.00)
GLUCOSE: 100 mg/dL — AB (ref 70–99)
Potassium: 3.8 mEq/L (ref 3.5–5.1)
SODIUM: 140 meq/L (ref 135–145)

## 2015-02-04 LAB — LIPID PANEL
Cholesterol: 175 mg/dL (ref 0–200)
HDL: 55.2 mg/dL (ref >39.00)
LDL Cholesterol: 109 mg/dL — ABNORMAL HIGH (ref 0–99)
NONHDL: 119.8
Total CHOL/HDL Ratio: 3
Triglycerides: 56 mg/dL (ref 0.0–149.0)
VLDL: 11.2 mg/dL (ref 0.0–40.0)

## 2015-02-04 LAB — CK: Total CK: 70 U/L (ref 7–177)

## 2015-02-10 DIAGNOSIS — M25561 Pain in right knee: Secondary | ICD-10-CM | POA: Diagnosis not present

## 2015-02-10 DIAGNOSIS — M25562 Pain in left knee: Secondary | ICD-10-CM | POA: Diagnosis not present

## 2015-02-13 ENCOUNTER — Ambulatory Visit (INDEPENDENT_AMBULATORY_CARE_PROVIDER_SITE_OTHER)
Admission: RE | Admit: 2015-02-13 | Discharge: 2015-02-13 | Disposition: A | Discharge status: Home / Self Care | Payer: Medicare Other | Source: Ambulatory Visit | Attending: Family Medicine | Admitting: Family Medicine

## 2015-02-13 DIAGNOSIS — Z780 Asymptomatic menopausal state: Secondary | ICD-10-CM

## 2015-02-13 DIAGNOSIS — Z13820 Encounter for screening for osteoporosis: Secondary | ICD-10-CM

## 2015-02-13 DIAGNOSIS — Z0000 Encounter for general adult medical examination without abnormal findings: Secondary | ICD-10-CM

## 2015-02-14 ENCOUNTER — Encounter: Payer: Self-pay | Admitting: Family Medicine

## 2015-02-14 ENCOUNTER — Ambulatory Visit (INDEPENDENT_AMBULATORY_CARE_PROVIDER_SITE_OTHER): Payer: Medicare Other | Admitting: Family Medicine

## 2015-02-14 ENCOUNTER — Telehealth: Payer: Self-pay | Admitting: Family Medicine

## 2015-02-14 VITALS — BP 120/70 | HR 68 | Temp 97.9°F | Wt 154.0 lb

## 2015-02-14 DIAGNOSIS — B029 Zoster without complications: Secondary | ICD-10-CM

## 2015-02-14 MED ORDER — VALACYCLOVIR HCL 1 G PO TABS: 1000.0000 mg | ORAL_TABLET | Freq: Three times a day (TID) | ORAL | Status: DC

## 2015-02-14 NOTE — Progress Notes (Signed)
   Subjective:    Patient ID: Aimee Lawrence, female    DOB: Feb 05, 1946, 69 y.o.   MRN: [id number]  HPI Patient is seen with slightly burning rash left groin area which started last night. She had shingles vaccine she estimates about 10 years ago. Her pain is relatively mild about 4 out of 10 severity. Burning quality. No alleviating factors. No exacerbating factors. No fever or chills.  Past Medical History  Diagnosis Date  . Osteopenia     last dexa in 2009 in [place]  . Allergy   . Asthma   . Hx of colonic polyps   . Depression    Past Surgical History  Procedure Laterality Date  . Nasal polyp surgery  2007  . Colonoscopy  07-20-12    per Dr. Carlean Purl, adenomatous polyps, repeat in 3 yrs   . Cholecystectomy  2007    lap choli  . Sinus endo w/fusion  06/27/2012    Procedure: ENDOSCOPIC SINUS SURGERY WITH FUSION NAVIGATION;  Surgeon: Rozetta Nunnery, MD;  Location: [facility];  Service: ENT;  Laterality: N/A;  FESS FUSION PROTOCOL    reports that she has never smoked. She has never used smokeless tobacco. She reports that she drinks alcohol. She reports that she does not use illicit drugs. family history includes Alcohol abuse in an other family member; Arthritis in an other family member; Breast cancer in her mother; Diabetes in an other family member; Heart disease in an other family member; Pneumonia in her mother. There is no history of Colon cancer or Stomach cancer. Allergies  Allergen Reactions  . Aleve [Naproxen Sodium]     Short of breath  . Aspirin     Trouble breathing  . Erythromycin Nausea And Vomiting  . Ibuprofen     Short of breath  . Ampicillin Rash      Review of Systems  Constitutional: Negative for fever and chills.       Objective:   Physical Exam  Constitutional: She appears well-developed and well-nourished.  Cardiovascular: Normal rate and regular rhythm.   Pulmonary/Chest: Effort normal and breath sounds normal. No respiratory  distress. She has no wheezes. She has no rales.  Skin: Rash noted.  Patient has rash following dermatome distribution in her left groin area with erythematous patch with some scattered vesicles.          Assessment & Plan:  Shingles. She is having relatively mild pain at this time. Keep area clean with soap and water. Valtrex 1 g 3 times a day for 7 days. Follow-up for any worsening pain or other concerns

## 2015-02-14 NOTE — Patient Instructions (Signed)

## 2015-02-14 NOTE — Telephone Encounter (Signed)
Patient Name: Aimee Lawrence  DOB: [birth year]    Initial Comment Caller states she had a prescription for Tramadol, she got a cortisone shot on Monday- She started taking Tylenol last night, and she woke up with a rash on her stomach, and legs. Burning.   Nurse Assessment  Nurse: Mallie Mussel, RN, Alveta Heimlich Date/Time Aimee Lawrence Time): 02/14/2015 12:33:32 PM  Confirm and document reason for call. If symptomatic, describe symptoms. ---Caller states she had a prescription for Tramadol , she got a cortisone shot on Monday- She started taking Tylenol last night, and she woke up with a rash on her stomach, and legs. Burning. The rash is between the lower part of her stomach and to her legs. She describes the rash as red raised bumps. Call was lost. I called her back, assessment continued. Denies fever. The phone was going in and out. She asked me to call her back at [phone]. She has had Tramadol since last Thursday. Orthopedic recommended to take Tylenol along with the Tramadol after the shot. She began taking Tylenol last night and developed. The rash is all in one row like a band. The rash does burn. She is unsure if she had chicken pox, but she had a shingles shot about 6 years ago.  Has the patient traveled out of the country within the last 30 days? ---No  Does the patient require triage? ---Yes  Related visit to physician within the last 2 weeks? ---No  Does the PT have any chronic conditions? (i.e. diabetes, asthma, etc.) ---Yes  List chronic conditions. ---Asthma     Guidelines    Guideline Title Affirmed Question Affirmed Notes  Rash or Redness - Localized Red, moist, irritated area between skin folds (or under larger breasts)    Final Disposition User   Call PCP within 24 Hours Mallie Mussel, RN, Alveta Heimlich    Comments  Patient describes symptoms similar to shingles. I felt it best she be seen in case it is shingles and medication can be started. Appointment scheduled for 2:45pm today with Dr. Carolann Littler.    Referrals  REFERRED TO PCP OFFICE   Disagree/Comply: Comply

## 2015-02-14 NOTE — Progress Notes (Signed)
Pre visit review using our clinic review tool, if applicable. No additional management support is needed unless otherwise documented below in the visit note. 

## 2015-03-12 DIAGNOSIS — M25561 Pain in right knee: Secondary | ICD-10-CM | POA: Diagnosis not present

## 2015-03-14 ENCOUNTER — Other Ambulatory Visit: Payer: Self-pay

## 2015-03-14 MED ORDER — TRAMADOL HCL 50 MG PO TABS: 100.0000 mg | ORAL_TABLET | Freq: Three times a day (TID) | ORAL | Status: DC | PRN

## 2015-03-14 NOTE — Telephone Encounter (Signed)
The pt called hoping to get a refill on her tramadol rx.  She states [facility] is going to be closed Sunday and Monday, so she is hoping to either pick it up today, tomorrow or sent to anther pharmacy  Pt callback - [phone]

## 2015-03-14 NOTE — Telephone Encounter (Signed)
Rx called in to the pharmacy. Called and spoke with pt and pt is aware.

## 2015-03-14 NOTE — Telephone Encounter (Signed)
Call in #[phone] with 5 rf 

## 2015-03-14 NOTE — Telephone Encounter (Signed)
Pt following up on refill request

## 2015-03-20 DIAGNOSIS — M25561 Pain in right knee: Secondary | ICD-10-CM | POA: Diagnosis not present

## 2015-03-24 DIAGNOSIS — M25561 Pain in right knee: Secondary | ICD-10-CM | POA: Diagnosis not present

## 2015-03-26 DIAGNOSIS — M25561 Pain in right knee: Secondary | ICD-10-CM | POA: Diagnosis not present

## 2015-03-28 DIAGNOSIS — M25561 Pain in right knee: Secondary | ICD-10-CM | POA: Diagnosis not present

## 2015-04-01 DIAGNOSIS — M25561 Pain in right knee: Secondary | ICD-10-CM | POA: Diagnosis not present

## 2015-04-03 DIAGNOSIS — M25561 Pain in right knee: Secondary | ICD-10-CM | POA: Diagnosis not present

## 2015-04-07 DIAGNOSIS — M25561 Pain in right knee: Secondary | ICD-10-CM | POA: Diagnosis not present

## 2015-04-15 DIAGNOSIS — M25561 Pain in right knee: Secondary | ICD-10-CM | POA: Diagnosis not present

## 2015-04-16 DIAGNOSIS — M1711 Unilateral primary osteoarthritis, right knee: Secondary | ICD-10-CM | POA: Diagnosis not present

## 2015-04-16 DIAGNOSIS — M25561 Pain in right knee: Secondary | ICD-10-CM | POA: Diagnosis not present

## 2015-04-17 DIAGNOSIS — M25561 Pain in right knee: Secondary | ICD-10-CM | POA: Diagnosis not present

## 2015-04-30 DIAGNOSIS — M1711 Unilateral primary osteoarthritis, right knee: Secondary | ICD-10-CM | POA: Diagnosis not present

## 2015-05-07 DIAGNOSIS — M1711 Unilateral primary osteoarthritis, right knee: Secondary | ICD-10-CM | POA: Diagnosis not present

## 2015-05-14 DIAGNOSIS — M1711 Unilateral primary osteoarthritis, right knee: Secondary | ICD-10-CM | POA: Diagnosis not present

## 2015-05-21 DIAGNOSIS — M1711 Unilateral primary osteoarthritis, right knee: Secondary | ICD-10-CM | POA: Diagnosis not present

## 2015-06-03 ENCOUNTER — Encounter: Payer: Self-pay | Admitting: Family Medicine

## 2015-06-03 ENCOUNTER — Ambulatory Visit (INDEPENDENT_AMBULATORY_CARE_PROVIDER_SITE_OTHER): Payer: Medicare Other | Admitting: Family Medicine

## 2015-06-03 VITALS — BP 116/64 | HR 84 | Temp 98.3°F | Ht 60.25 in | Wt 155.0 lb

## 2015-06-03 DIAGNOSIS — J40 Bronchitis, not specified as acute or chronic: Secondary | ICD-10-CM

## 2015-06-03 MED ORDER — TRAMADOL HCL 50 MG PO TABS: 120.0000 mg | ORAL_TABLET | Freq: Three times a day (TID) | ORAL | Status: DC | PRN

## 2015-06-03 MED ORDER — AZITHROMYCIN 250 MG PO TABS: ORAL_TABLET | ORAL | Status: DC

## 2015-06-03 NOTE — Progress Notes (Signed)
Pre visit review using our clinic review tool, if applicable. No additional management support is needed unless otherwise documented below in the visit note. 

## 2015-06-03 NOTE — Progress Notes (Signed)
   Subjective:    Patient ID: Aimee Lawrence, female    DOB: 1945-12-01, 69 y.o.   MRN: [id number]  HPI Here for one week of PND, chets congestion, and couging up green sputum. No fever.    Review of Systems  Constitutional: Negative.   HENT: Positive for congestion. Negative for postnasal drip and sinus pressure.   Eyes: Negative.   Respiratory: Positive for cough and chest tightness.        Objective:   Physical Exam  Constitutional: She appears well-developed and well-nourished.  HENT:  Right Ear: External ear normal.  Left Ear: External ear normal.  Nose: Nose normal.  Mouth/Throat: Oropharynx is clear and moist.  Eyes: Conjunctivae are normal.  Neck: No thyromegaly present.  Pulmonary/Chest: Effort normal. No respiratory distress. She has no wheezes. She has no rales.  Scattered rhonchi   Lymphadenopathy:    She has no cervical adenopathy.          Assessment & Plan:  Bronchitis, treat with a Zpack.

## 2015-06-18 DIAGNOSIS — M25561 Pain in right knee: Secondary | ICD-10-CM | POA: Diagnosis not present

## 2015-06-30 ENCOUNTER — Encounter: Payer: Self-pay | Admitting: Family Medicine

## 2015-06-30 ENCOUNTER — Ambulatory Visit (INDEPENDENT_AMBULATORY_CARE_PROVIDER_SITE_OTHER): Payer: Medicare Other | Admitting: Family Medicine

## 2015-06-30 VITALS — BP 111/64 | HR 81 | Temp 98.2°F | Ht 60.25 in | Wt 156.0 lb

## 2015-06-30 DIAGNOSIS — R739 Hyperglycemia, unspecified: Secondary | ICD-10-CM | POA: Diagnosis not present

## 2015-06-30 DIAGNOSIS — Z0000 Encounter for general adult medical examination without abnormal findings: Secondary | ICD-10-CM | POA: Diagnosis not present

## 2015-06-30 DIAGNOSIS — Z23 Encounter for immunization: Secondary | ICD-10-CM

## 2015-06-30 DIAGNOSIS — M25569 Pain in unspecified knee: Secondary | ICD-10-CM | POA: Diagnosis not present

## 2015-06-30 LAB — HEMOGLOBIN A1C: HEMOGLOBIN A1C: 5.9 % (ref 4.6–6.5)

## 2015-06-30 MED ORDER — DULOXETINE HCL 30 MG PO CPEP: 30.0000 mg | ORAL_CAPSULE | Freq: Every day | ORAL | Status: DC

## 2015-06-30 NOTE — Addendum Note (Signed)
Addended by: Aggie Hacker A on: 06/30/2015 10:38 AM   Modules accepted: Orders

## 2015-06-30 NOTE — Progress Notes (Signed)
   Subjective:    Patient ID: Aimee Lawrence, female    DOB: 04-19-1946, 69 y.o.   MRN: [id number]  HPI Here for several things. First she needs a pneumonia shot. She wants to be screened for hepatitis C. She wants to have an A1c drawn since her fasting glucose last July was borderline at 100. Also she has chronic knee pain and wants to try Cymbalta for this.    Review of Systems  Constitutional: Negative.   Respiratory: Negative.   Cardiovascular: Negative.   Musculoskeletal: Positive for arthralgias and gait problem.  Psychiatric/Behavioral: Negative.        Objective:   Physical Exam  Constitutional: She is oriented to person, place, and time. She appears well-developed and well-nourished.  Neck: No thyromegaly present.  Cardiovascular: Normal rate, regular rhythm, normal heart sounds and intact distal pulses.   Pulmonary/Chest: Effort normal and breath sounds normal.  Musculoskeletal: Normal range of motion. She exhibits no edema or tenderness.  Lymphadenopathy:    She has no cervical adenopathy.  Neurological: She is alert and oriented to person, place, and time.          Assessment & Plan:  Given the Prevnar 13 shot. Check an A1c and for hep. C antibodies. Try Cymbalta 30 mg daily.

## 2015-06-30 NOTE — Progress Notes (Signed)
Pre visit review using our clinic review tool, if applicable. No additional management support is needed unless otherwise documented below in the visit note. 

## 2015-07-01 LAB — HEPATITIS C ANTIBODY: HCV Ab: NEGATIVE

## 2015-08-07 DIAGNOSIS — M23321 Other meniscus derangements, posterior horn of medial meniscus, right knee: Secondary | ICD-10-CM | POA: Diagnosis not present

## 2015-08-07 DIAGNOSIS — S83241A Other tear of medial meniscus, current injury, right knee, initial encounter: Secondary | ICD-10-CM | POA: Diagnosis not present

## 2015-08-07 DIAGNOSIS — M6751 Plica syndrome, right knee: Secondary | ICD-10-CM | POA: Diagnosis not present

## 2015-08-07 DIAGNOSIS — M2241 Chondromalacia patellae, right knee: Secondary | ICD-10-CM | POA: Diagnosis not present

## 2015-08-07 DIAGNOSIS — M94261 Chondromalacia, right knee: Secondary | ICD-10-CM | POA: Diagnosis not present

## 2015-08-07 DIAGNOSIS — G8918 Other acute postprocedural pain: Secondary | ICD-10-CM | POA: Diagnosis not present

## 2015-08-11 ENCOUNTER — Encounter: Payer: Self-pay | Admitting: Internal Medicine

## 2015-08-13 DIAGNOSIS — M1711 Unilateral primary osteoarthritis, right knee: Secondary | ICD-10-CM | POA: Diagnosis not present

## 2015-08-13 DIAGNOSIS — M25561 Pain in right knee: Secondary | ICD-10-CM | POA: Diagnosis not present

## 2015-08-19 ENCOUNTER — Encounter: Payer: Self-pay | Admitting: Internal Medicine

## 2015-08-21 DIAGNOSIS — M25561 Pain in right knee: Secondary | ICD-10-CM | POA: Diagnosis not present

## 2015-08-21 DIAGNOSIS — M1711 Unilateral primary osteoarthritis, right knee: Secondary | ICD-10-CM | POA: Diagnosis not present

## 2015-08-26 DIAGNOSIS — M1711 Unilateral primary osteoarthritis, right knee: Secondary | ICD-10-CM | POA: Diagnosis not present

## 2015-08-26 DIAGNOSIS — M25561 Pain in right knee: Secondary | ICD-10-CM | POA: Diagnosis not present

## 2015-08-27 DIAGNOSIS — M1711 Unilateral primary osteoarthritis, right knee: Secondary | ICD-10-CM | POA: Diagnosis not present

## 2015-08-28 DIAGNOSIS — M25561 Pain in right knee: Secondary | ICD-10-CM | POA: Diagnosis not present

## 2015-08-28 DIAGNOSIS — M1711 Unilateral primary osteoarthritis, right knee: Secondary | ICD-10-CM | POA: Diagnosis not present

## 2015-09-02 DIAGNOSIS — M25561 Pain in right knee: Secondary | ICD-10-CM | POA: Diagnosis not present

## 2015-09-02 DIAGNOSIS — M1711 Unilateral primary osteoarthritis, right knee: Secondary | ICD-10-CM | POA: Diagnosis not present

## 2015-09-04 DIAGNOSIS — M25561 Pain in right knee: Secondary | ICD-10-CM | POA: Diagnosis not present

## 2015-09-04 DIAGNOSIS — M1711 Unilateral primary osteoarthritis, right knee: Secondary | ICD-10-CM | POA: Diagnosis not present

## 2015-09-09 DIAGNOSIS — M1711 Unilateral primary osteoarthritis, right knee: Secondary | ICD-10-CM | POA: Diagnosis not present

## 2015-09-09 DIAGNOSIS — M25561 Pain in right knee: Secondary | ICD-10-CM | POA: Diagnosis not present

## 2015-09-11 DIAGNOSIS — M1711 Unilateral primary osteoarthritis, right knee: Secondary | ICD-10-CM | POA: Diagnosis not present

## 2015-09-11 DIAGNOSIS — M25561 Pain in right knee: Secondary | ICD-10-CM | POA: Diagnosis not present

## 2015-09-29 DIAGNOSIS — Z9889 Other specified postprocedural states: Secondary | ICD-10-CM | POA: Diagnosis not present

## 2015-10-08 ENCOUNTER — Ambulatory Visit (AMBULATORY_SURGERY_CENTER): Payer: Self-pay

## 2015-10-08 VITALS — Ht 61.0 in | Wt 168.6 lb

## 2015-10-08 DIAGNOSIS — Z860100 Personal history of colon polyps, unspecified: Secondary | ICD-10-CM

## 2015-10-08 NOTE — Progress Notes (Signed)
No allergies to eggs or soy No diet/weight loss meds No past problems with anesthesia No home oxygen  Has email and internet; registered for emmi 

## 2015-10-17 ENCOUNTER — Encounter: Payer: Self-pay | Admitting: Family Medicine

## 2015-10-17 ENCOUNTER — Ambulatory Visit (INDEPENDENT_AMBULATORY_CARE_PROVIDER_SITE_OTHER): Payer: Medicare Other | Admitting: Family Medicine

## 2015-10-17 VITALS — BP 130/80 | HR 72 | Temp 98.0°F | Resp 20 | Ht 61.0 in | Wt 170.0 lb

## 2015-10-17 DIAGNOSIS — J329 Chronic sinusitis, unspecified: Secondary | ICD-10-CM

## 2015-10-17 DIAGNOSIS — A499 Bacterial infection, unspecified: Secondary | ICD-10-CM

## 2015-10-17 DIAGNOSIS — B9689 Other specified bacterial agents as the cause of diseases classified elsewhere: Secondary | ICD-10-CM

## 2015-10-17 MED ORDER — AZITHROMYCIN 250 MG PO TABS: ORAL_TABLET | ORAL | Status: DC

## 2015-10-17 NOTE — Progress Notes (Signed)
PCP: [name], MD  Subjective:  Aimee Lawrence is a 70 y.o. year old very pleasant female patient who presents with sinusitis symptoms including nasal congestion, sinus tenderness -other symptoms include: mild cough -day of illness:11 -started: last tuesday -Symptoms show no change -previous treatments: tylenol for sinus pressure -sick contacts/travel/risks: denies flu exposure.  -Hx of: allergies  ROS-denies fever, SOB, NVD, tooth pain  Pertinent Past Medical History-  Patient Active Problem List   Diagnosis Date Noted  . Nasal polyps 11/20/2012  . 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 Essential hypertension 05/28/2010  . DEPRESSION 02/28/2007  . ASTHMA 02/28/2007  . OSTEOPENIA 02/28/2007  . Personal hx colon polyps 02/28/2007    Medications- reviewed  Current Outpatient Prescriptions  Medication Sig Dispense Refill  . albuterol (PROAIR HFA) 108 (90 BASE) MCG/ACT inhaler Inhale 2 puffs into the lungs every 4 (four) hours as needed for wheezing or shortness of breath. 1 Inhaler 11  . Ascorbic Acid (VITAMIN C CR PO) Take 1 tablet by mouth daily.     . Calcium Carbonate (CALCIUM 600 PO) Take 1 tablet by mouth daily.     . Cetirizine HCl (ZYRTEC PO) Take by mouth daily.    . Cholecalciferol (VITAMIN D-3 PO) Take 1 tablet by mouth daily.     . DULoxetine (CYMBALTA) 30 MG capsule Take 1 capsule (30 mg total) by mouth daily. 30 capsule 3  . FIBER FORMULA CAPS Take 1 tablet by mouth daily.     . fluticasone (FLONASE) 50 MCG/ACT nasal spray Place 2 sprays into both nostrils daily. 16 g 11  . HYDROcodone-acetaminophen (NORCO) 10-325 MG tablet Take 1 tablet by mouth every 6 (six) hours as needed.    . traMADol (ULTRAM) 50 MG tablet Take 2.5 tablets (125 mg total) by mouth every 8 (eight) hours as needed for moderate pain. 120 tablet 5  . azithromycin (ZITHROMAX) 250 MG tablet Take 2 tabs on day 1, then 1 tab daily until finished 6 tablet 0   No current facility-administered  medications for this visit.    Objective: BP 130/80 mmHg  Pulse 72  Temp(Src) 98 F (36.7 C) (Oral)  Resp 20  Ht 5\' 1"  (1.549 m)  Wt 170 lb (77.111 kg)  BMI 32.14 kg/m2  SpO2 97% Gen: NAD, resting comfortably HEENT: Turbinates erythematous with yellow drainage, TM normal, pharynx mildly erythematous with no tonsilar exudate or edema, maxillary sinus tenderness CV: RRR no murmurs rubs or gallops Lungs: CTAB no crackles, wheeze, rhonchi Abdomen: soft/nontender/nondistended/normal bowel sounds. No rebound or guarding.  Ext: no edema Skin: warm, dry, no rash Neuro: grossly normal, moves all extremities  Assessment/Plan:  Sinsusitis Bacterial based on: Symptoms >10 days, -other symptomatic care with Mucinex -Antibiotic indicated: yes  Finally, we reviewed reasons to return to care including if symptoms worsen or persist or new concerns arise.  Meds ordered this encounter  Medications  . azithromycin (ZITHROMAX) 250 MG tablet    Sig: Take 2 tabs on day 1, then 1 tab daily until finished    Dispense:  6 tablet    Refill:  0  new acute case. Does have history of sinusitis but this is not chronic sinusitis with flare.

## 2015-10-17 NOTE — Progress Notes (Signed)
Pre visit review using our clinic review tool, if applicable. No additional management support is needed unless otherwise documented below in the visit note. 

## 2015-10-17 NOTE — Patient Instructions (Signed)
Sinsusitis Bacterial based on: Symptoms >10 days, -other symptomatic care with Mucinex -Antibiotic indicated: yes  Finally, we reviewed reasons to return to care including if symptoms worsen or persist or new concerns arise.  Meds ordered this encounter  Medications  . azithromycin (ZITHROMAX) 250 MG tablet    Sig: Take 2 tabs on day 1, then 1 tab daily until finished    Dispense:  6 tablet    Refill:  0  new acute case. Does have history of sinusitis but this is not chronic sinusitis with flare.

## 2015-10-22 ENCOUNTER — Ambulatory Visit (AMBULATORY_SURGERY_CENTER): Payer: Medicare Other | Admitting: Internal Medicine

## 2015-10-22 ENCOUNTER — Encounter: Payer: Medicare Other | Admitting: Internal Medicine

## 2015-10-22 ENCOUNTER — Encounter: Payer: Self-pay | Admitting: Internal Medicine

## 2015-10-22 VITALS — BP 114/60 | HR 77 | Temp 96.0°F | Resp 11 | Ht 61.0 in | Wt 168.0 lb

## 2015-10-22 DIAGNOSIS — Z86010 Personal history of colonic polyps: Secondary | ICD-10-CM

## 2015-10-22 HISTORY — PX: COLONOSCOPY: SHX174

## 2015-10-22 MED ORDER — SODIUM CHLORIDE 0.9 % IV SOLN: 500.0000 mL | INTRAVENOUS | Status: DC

## 2015-10-22 NOTE — Patient Instructions (Addendum)
No polyps today! I recommend you be considered to repeat a colonoscopy in 2022.  I appreciate the opportunity to care for you. Gatha Mayer, MD, FACG      YOU HAD AN ENDOSCOPIC PROCEDURE TODAY AT [facility]:   Refer to the procedure report that was given to you for any specific questions about what was found during the examination.  If the procedure report does not answer your questions, please call your gastroenterologist to clarify.  If you requested that your care partner not be given the details of your procedure findings, then the procedure report has been included in a sealed envelope for you to review at your convenience later.  YOU SHOULD EXPECT: Some feelings of bloating in the abdomen. Passage of more gas than usual.  Walking can help get rid of the air that was put into your GI tract during the procedure and reduce the bloating. If you had a lower endoscopy (such as a colonoscopy or flexible sigmoidoscopy) you may notice spotting of blood in your stool or on the toilet paper. If you underwent a bowel prep for your procedure, you may not have a normal bowel movement for a few days.  Please Note:  You might notice some irritation and congestion in your nose or some drainage.  This is from the oxygen used during your procedure.  There is no need for concern and it should clear up in a day or so.  SYMPTOMS TO REPORT IMMEDIATELY:   Following lower endoscopy (colonoscopy or flexible sigmoidoscopy):  Excessive amounts of blood in the stool  Significant tenderness or worsening of abdominal pains  Swelling of the abdomen that is new, acute  Fever of 100F or higher   For urgent or emergent issues, a gastroenterologist can be reached at any hour by calling [phone].   DIET: Your first meal following the procedure should be a small meal and then it is ok to progress to your normal diet. Heavy or fried foods are harder to digest and may make you feel  nauseous or bloated.  Likewise, meals heavy in dairy and vegetables can increase bloating.  Drink plenty of fluids but you should avoid alcoholic beverages for 24 hours.  ACTIVITY:  You should plan to take it easy for the rest of today and you should NOT DRIVE or use heavy machinery until tomorrow (because of the sedation medicines used during the test).    FOLLOW UP: Our staff will call the number listed on your records the next business day following your procedure to check on you and address any questions or concerns that you may have regarding the information given to you following your procedure. If we do not reach you, we will leave a message.  However, if you are feeling well and you are not experiencing any problems, there is no need to return our call.  We will assume that you have returned to your regular daily activities without incident.  If any biopsies were taken you will be contacted by phone or by letter within the next 1-3 weeks.  Please call us at [phone] if you have not heard about the biopsies in 3 weeks.    SIGNATURES/CONFIDENTIALITY: You and/or your care partner have signed paperwork which will be entered into your electronic medical record.  These signatures attest to the fact that that the information above on your After Visit Summary has been reviewed and is understood.  Full responsibility of the confidentiality of  this discharge information lies with you and/or your care-partner.    You may resume your current medications today.  Please call if any questions or concerns.

## 2015-10-22 NOTE — Progress Notes (Signed)
No problems noted in the recovery room. maw 

## 2015-10-22 NOTE — Op Note (Signed)
[facility] Patient Name: Aimee Lawrence Procedure Date: 10/22/2015 10:24 AM MRN: [id number] Endoscopist: Gatha Mayer , MD Age: 70 Date of Birth: 1946/06/07 Gender: Female Procedure:                Colonoscopy Indications:              High risk colon cancer surveillance: Personal                            history of colonic polyps Medicines:                Propofol per Anesthesia, Monitored Anesthesia Care Procedure:                Pre-Anesthesia Assessment:                           - Prior to the procedure, a History and Physical                            was performed, and patient medications and                            allergies were reviewed. The patient's tolerance of                            previous anesthesia was also reviewed. The risks                            and benefits of the procedure and the sedation                            options and risks were discussed with the patient.                            All questions were answered, and informed consent                            was obtained. Prior Anticoagulants: The patient has                            taken no previous anticoagulant or antiplatelet                            agents. ASA Grade Assessment: II - A patient with                            mild systemic disease. After reviewing the risks                            and benefits, the patient was deemed in                            satisfactory condition to undergo the procedure.  After obtaining informed consent, the colonoscope                            was passed under direct vision. Throughout the                            procedure, the patient's blood pressure, pulse, and                            oxygen saturations were monitored continuously. The                            Model CF-HQ190L [id number]) scope was introduced                            through the anus and advanced to the the cecum,                        identified by appendiceal orifice and ileocecal                            valve. The ileocecal valve, appendiceal orifice,                            and rectum were photographed. The quality of the                            bowel preparation was good. The colonoscopy was                            performed without difficulty. The patient tolerated                            the procedure well. Scope In: 11:32:01 AM Scope Out: 11:41:14 AM Scope Withdrawal Time: 0 hours 6 minutes 30 seconds  Total Procedure Duration: 0 hours 9 minutes 13 seconds  Findings:                 The colon (entire examined portion) appeared normal. Complications:            No immediate complications. Estimated blood loss:                            None. Estimated Blood Loss:     Estimated blood loss: none. Recommendation:           - Repeat colonoscopy in 5 years for surveillance.                           - Patient has a contact number available for                            emergencies. The signs and symptoms of potential                            delayed complications were discussed with the  patient. Return to normal activities tomorrow.                            Written discharge instructions were provided to the                            patient.                           - Resume previous diet.                           - Continue present medications. Gatha Mayer, MD 10/22/2015 11:55:05 AM This report has been signed electronically.

## 2015-10-22 NOTE — Progress Notes (Signed)
Report given to PACU RN, vss 

## 2015-10-23 ENCOUNTER — Telehealth: Payer: Self-pay

## 2015-10-23 NOTE — Telephone Encounter (Signed)
  Follow up Call-  Call back number 10/22/2015  Post procedure Call Back phone  # [phone]  Permission to leave phone message Yes    Patient was reminded that she forgot her inhaler yesterday and was told that it was at the front desk on the 4th floor. The patient was also called on 10/22/2015.  Patient questions:  Do you have a fever, pain , or abdominal swelling? No. Pain Score  0 *  Have you tolerated food without any problems? Yes.    Have you been able to return to your normal activities? Yes.    Do you have any questions about your discharge instructions: Diet   No. Medications  No. Follow up visit  No.  Do you have questions or concerns about your Care? No.  Actions: * If pain score is 4 or above: No action needed, pain <4.

## 2015-11-04 ENCOUNTER — Telehealth: Payer: Self-pay | Admitting: Family Medicine

## 2015-11-04 NOTE — Telephone Encounter (Signed)
Pt said cymbalta is not helping her pain. Please advise

## 2015-11-04 NOTE — Telephone Encounter (Signed)
I left a voice message for pt to return my call, where is the pain?

## 2015-11-05 MED ORDER — DULOXETINE HCL 30 MG PO CPEP: 60.0000 mg | ORAL_CAPSULE | Freq: Every day | ORAL | Status: DC

## 2015-11-05 NOTE — Telephone Encounter (Signed)
Pt did not specify where pain was in initial call, waiting on pt to call us back.

## 2015-11-05 NOTE — Telephone Encounter (Signed)
This was to help with chronic knee pain. Increase the

## 2015-11-05 NOTE — Telephone Encounter (Signed)
Increase the dose of Cymbalta to 60 mg daily (she can take 2 of the 30 mg pills together). Call in 60 mg to take daily, #30 wit 5 rf

## 2015-11-05 NOTE — Telephone Encounter (Signed)
Was this for knee pain maybe?

## 2015-11-05 NOTE — Telephone Encounter (Signed)
I sent script e-scribe and left a voice message for pt with this information.  

## 2015-11-21 DIAGNOSIS — M25561 Pain in right knee: Secondary | ICD-10-CM | POA: Diagnosis not present

## 2015-12-24 DIAGNOSIS — H25013 Cortical age-related cataract, bilateral: Secondary | ICD-10-CM | POA: Diagnosis not present

## 2015-12-30 ENCOUNTER — Other Ambulatory Visit: Payer: Self-pay | Admitting: Family Medicine

## 2015-12-30 NOTE — Telephone Encounter (Signed)
Call in #[phone] with 5 rf 

## 2016-02-10 DIAGNOSIS — L814 Other melanin hyperpigmentation: Secondary | ICD-10-CM | POA: Diagnosis not present

## 2016-02-10 DIAGNOSIS — D225 Melanocytic nevi of trunk: Secondary | ICD-10-CM | POA: Diagnosis not present

## 2016-02-10 DIAGNOSIS — L821 Other seborrheic keratosis: Secondary | ICD-10-CM | POA: Diagnosis not present

## 2016-02-25 ENCOUNTER — Ambulatory Visit (INDEPENDENT_AMBULATORY_CARE_PROVIDER_SITE_OTHER): Payer: Medicare Other | Admitting: Adult Health

## 2016-02-25 ENCOUNTER — Encounter: Payer: Self-pay | Admitting: Adult Health

## 2016-02-25 VITALS — BP 124/80 | HR 80 | Temp 98.5°F | Ht 61.0 in | Wt 168.2 lb

## 2016-02-25 DIAGNOSIS — J040 Acute laryngitis: Secondary | ICD-10-CM

## 2016-02-25 MED ORDER — MAGIC MOUTHWASH W/LIDOCAINE
5.0000 mL | Freq: Three times a day (TID) | ORAL | 0 refills | Status: DC | PRN
Start: 2016-02-25 — End: 2016-06-01

## 2016-02-25 NOTE — Patient Instructions (Addendum)
Laryngitis  Laryngitis is inflammation of your vocal cords. This causes hoarseness, coughing, loss of voice, sore throat, or a dry throat. Your vocal cords are two bands of muscles that are found in your throat. When you speak, these cords come together and vibrate. These vibrations come out through your mouth as sound. When your vocal cords are inflamed, your voice sounds different.  Laryngitis can be temporary (acute) or long-term (chronic). Most cases of acute laryngitis improve with time. Chronic laryngitis is laryngitis that lasts for more than three weeks.  CAUSES  Acute laryngitis may be caused by:   A viral infection.   Lots of talking, yelling, or singing. This is also called vocal strain.   Bacterial infections.  Chronic laryngitis may be caused by:   Vocal strain.   Injury to your vocal cords.   Acid reflux (gastroesophageal reflux disease or GERD).   Allergies.   Sinus infection.   Smoking.   Alcohol abuse.   Breathing in chemicals or dust.   Growths on the vocal cords.  RISK FACTORS  Risk factors for laryngitis include:   Smoking.   Alcohol abuse.   Having allergies.  SIGNS AND SYMPTOMS  Symptoms of laryngitis may include:   Low, hoarse voice.   Loss of voice.   Dry cough.   Sore throat.   Stuffy nose.  DIAGNOSIS  Laryngitis may be diagnosed by:   Physical exam.   Throat culture.   Blood test.   Laryngoscopy. This procedure allows your health care provider to look at your vocal cords with a mirror or viewing tube.  TREATMENT  Treatment for laryngitis depends on what is causing it. Usually, treatment involves resting your voice and using medicines to soothe your throat. However, if your laryngitis is caused by a bacterial infection, you may need to take antibiotic medicine. If your laryngitis is caused by a growth, you may need to have a procedure to remove it.  HOME CARE INSTRUCTIONS   Drink enough fluid to keep your urine clear or pale yellow.   Breathe in moist air. Use a  humidifier if you live in a dry climate.   Take medicines only as directed by your health care provider.   If you were prescribed an antibiotic medicine, finish it all even if you start to feel better.   Do not smoke cigarettes or electronic cigarettes. If you need help quitting, ask your health care provider.   Talk as little as possible. Also avoid whispering, which can cause vocal strain.   Write instead of talking. Do this until your voice is back to normal.  SEEK MEDICAL CARE IF:   You have a fever.   You have increasing pain.   You have difficulty swallowing.  SEEK IMMEDIATE MEDICAL CARE IF:   You cough up blood.   You have trouble breathing.     This information is not intended to replace advice given to you by your health care provider. Make sure you discuss any questions you have with your health care provider.     Document Released: 06/28/2005 Document Revised: 07/19/2014 Document Reviewed: 12/11/2013  Elsevier Interactive Patient Education 2016 [employer]vier Inc[employer]

## 2016-02-25 NOTE — Progress Notes (Signed)
Subjective:    Patient ID: Aimee Lawrence, female    DOB: Jun 21, 1946, 70 y.o.   MRN: [id number]  HPI  70 year old female, patient of Dr. Sarajane Jews who presents to the office today for hoarseness that she first noticed this morning. She reports having a sore throat and trouble swallowing as well as a dry cough.   She denies any fevers, sinus pain or pressure or ear pain.   Review of Systems  Constitutional: Negative.   HENT: Positive for sore throat, trouble swallowing and voice change. Negative for postnasal drip, rhinorrhea and sinus pressure.   Respiratory: Negative.   Genitourinary: Negative.   Neurological: Negative.   All other systems reviewed and are negative.  Past Medical History:  Diagnosis Date  . Allergy   . Asthma   . Depression   . Hx of colonic polyps   . Osteopenia    last dexa in 2009 in [place] History  . Marital status: Single    Spouse name: N/A  . Number of children: N/A  . Years of education: N/A   Occupational History  . Retired Pharmacist, hospital    Social History Main Topics  . Smoking status: Never Smoker  . Smokeless tobacco: Never Used  . Alcohol use 0.0 oz/week     Comment: rare  . Drug use: No  . Sexual activity: Not on file   Other Topics Concern  . Not on file   Social History Narrative   Retired   Single   Regular exercise- no          Past Surgical History:  Procedure Laterality Date  . CHOLECYSTECTOMY  2007   lap choli  . COLONOSCOPY  multiple since 2003   per Dr. Carlean Purl, adenomatous polyps, recall 2022  . NASAL POLYP SURGERY  2007  . SINUS ENDO W/FUSION  06/27/2012   Procedure: ENDOSCOPIC SINUS SURGERY WITH FUSION NAVIGATION;  Surgeon: Rozetta Nunnery, MD;  Location: [facility];  Service: ENT;  Laterality: N/A;  FESS FUSION PROTOCOL    Family History  Problem Relation Age of Onset  . Alcohol abuse    . Arthritis    . Breast cancer Mother   . Diabetes    . Heart disease    .  Colon cancer Neg Hx   . Stomach cancer Neg Hx   . Pneumonia Mother     Allergies  Allergen Reactions  . Aleve [Naproxen Sodium]     Short of breath  . Aspirin     Trouble breathing  . Erythromycin Nausea And Vomiting  . Ibuprofen     Short of breath  . Ampicillin Rash    No current outpatient prescriptions on file prior to visit.   No current facility-administered medications on file prior to visit.     BP 124/80 (BP Location: Left Arm, Patient Position: Sitting, Cuff Size: Normal)   Pulse 80   Temp 98.5 F (36.9 C) (Oral)   Ht 5\' 1"  (1.549 m)   Wt 168 lb 3.2 oz (76.3 kg)   SpO2 95%   BMI 31.78 kg/m       Objective:   Physical Exam  Constitutional: She is oriented to person, place, and time. She appears well-developed and well-nourished. No distress.  HENT:  Head: Normocephalic and atraumatic.  Right Ear: External ear normal.  Left Ear: External ear normal.  Nose: Nose normal.  Mouth/Throat: Oropharynx is clear and moist.  Eyes:  Conjunctivae and EOM are normal. Pupils are equal, round, and reactive to light. Right eye exhibits no discharge. Left eye exhibits no discharge. No scleral icterus.  Neck: Normal range of motion. Neck supple. No thyromegaly present.  Cardiovascular: Normal rate, regular rhythm, normal heart sounds and intact distal pulses.  Exam reveals no gallop and no friction rub.   No murmur heard. Pulmonary/Chest: Effort normal and breath sounds normal. No respiratory distress. She has no wheezes. She has no rales. She exhibits no tenderness.  Lymphadenopathy:    She has no cervical adenopathy.  Neurological: She is alert and oriented to person, place, and time.  Skin: Skin is warm and dry. No rash noted. She is not diaphoretic. No erythema. No pallor.  Psychiatric: She has a normal mood and affect. Her behavior is normal. Judgment and thought content normal.  Nursing note and vitals reviewed.     Assessment & Plan:  1. Laryngitis - No signs of  bacterial infection - magic mouthwash w/lidocaine SOLN; Take 5 mLs by mouth 3 (three) times daily as needed.  Dispense: 180 mL; Refill: 0 - warm salt water gargles - Honey  - Follow up if no improvement in 1 week  Dorothyann Peng, NP

## 2016-02-25 NOTE — Progress Notes (Signed)
Pre visit review using our clinic review tool, if applicable. No additional management support is needed unless otherwise documented below in the visit note. 

## 2016-03-19 DIAGNOSIS — M1711 Unilateral primary osteoarthritis, right knee: Secondary | ICD-10-CM | POA: Diagnosis not present

## 2016-03-19 DIAGNOSIS — M25561 Pain in right knee: Secondary | ICD-10-CM | POA: Diagnosis not present

## 2016-03-26 DIAGNOSIS — M1711 Unilateral primary osteoarthritis, right knee: Secondary | ICD-10-CM | POA: Diagnosis not present

## 2016-04-02 DIAGNOSIS — M1711 Unilateral primary osteoarthritis, right knee: Secondary | ICD-10-CM | POA: Diagnosis not present

## 2016-04-09 DIAGNOSIS — M1711 Unilateral primary osteoarthritis, right knee: Secondary | ICD-10-CM | POA: Diagnosis not present

## 2016-04-16 DIAGNOSIS — M1711 Unilateral primary osteoarthritis, right knee: Secondary | ICD-10-CM | POA: Diagnosis not present

## 2016-05-27 ENCOUNTER — Other Ambulatory Visit: Payer: Self-pay | Admitting: Orthopaedic Surgery

## 2016-05-31 ENCOUNTER — Ambulatory Visit: Payer: Medicare Other | Admitting: Family Medicine

## 2016-06-01 ENCOUNTER — Ambulatory Visit (INDEPENDENT_AMBULATORY_CARE_PROVIDER_SITE_OTHER): Payer: Medicare Other | Admitting: Internal Medicine

## 2016-06-01 ENCOUNTER — Encounter: Payer: Self-pay | Admitting: Internal Medicine

## 2016-06-01 VITALS — BP 112/60 | HR 72 | Temp 97.5°F | Ht 61.0 in | Wt 163.6 lb

## 2016-06-01 DIAGNOSIS — J018 Other acute sinusitis: Secondary | ICD-10-CM

## 2016-06-01 DIAGNOSIS — R062 Wheezing: Secondary | ICD-10-CM

## 2016-06-01 MED ORDER — ALBUTEROL SULFATE HFA 108 (90 BASE) MCG/ACT IN AERS: 2.0000 | INHALATION_SPRAY | Freq: Four times a day (QID) | RESPIRATORY_TRACT | 1 refills | Status: DC | PRN

## 2016-06-01 MED ORDER — DOXYCYCLINE HYCLATE 100 MG PO TABS: 100.0000 mg | ORAL_TABLET | Freq: Two times a day (BID) | ORAL | 0 refills | Status: DC

## 2016-06-01 NOTE — Progress Notes (Signed)
Pre visit review using our clinic review tool, if applicable. No additional management support is needed unless otherwise documented below in the visit note.  Chief Complaint  Patient presents with  . Shortness of Breath    Ongoing ofr 7-8 days  . Headache  . Nasal Congestion  . Sore Throat    HPI:  Aimee Lawrence 70 y.o.  sda pcp NA  Onset like a cold  About 8-9 days ago  But not getting better    A bit better   .  To have tka early December   Right isde worse .    Face saline and flonase  tramadol cant take asa a bit concerned that she will not be able to proceed with the surgery.  Mild discharge right facial tenderness. Intermittent shortness of breath has an albuterol but hasn't been using it. Diagnosis of asthma on the past history. Has had some chills but no fever per se. No hemoptysis. ROS: See pertinent positives and negatives per HPI.  Past Medical History:  Diagnosis Date  . Allergy   . Asthma   . Depression   . Hx of colonic polyps   . Osteopenia    last dexa in 2009 in [place]    Family History  Problem Relation Age of Onset  . Alcohol abuse    . Arthritis    . Breast cancer Mother   . Diabetes    . Heart disease    . Colon cancer Neg Hx   . Stomach cancer Neg Hx   . Pneumonia Mother     Social History   Social History  . Marital status: Single    Spouse name: N/A  . Number of children: N/A  . Years of education: N/A   Occupational History  . Retired Pharmacist, hospital    Social History Main Topics  . Smoking status: Never Smoker  . Smokeless tobacco: Never Used  . Alcohol use 0.0 oz/week     Comment: rare  . Drug use: No  . Sexual activity: Not Asked   Other Topics Concern  . None   Social History Narrative   Retired   Single   Regular exercise- no          Outpatient Medications Prior to Visit  Medication Sig Dispense Refill  . Calcium Carbonate-Vitamin D (CALCIUM 500/D PO) Take 2 tablets by mouth daily.    . Cholecalciferol (VITAMIN D3)  2000 units capsule Take 4,000 Units by mouth daily.    . fluticasone (FLONASE) 50 MCG/ACT nasal spray Place 2 sprays into both nostrils daily.    . traMADol (ULTRAM) 50 MG tablet Take 100 mg by mouth 2 (two) times daily.     . vitamin C (ASCORBIC ACID) 250 MG tablet Take 500 mg by mouth daily.    Marland Kitchen albuterol (PROVENTIL HFA;VENTOLIN HFA) 108 (90 Base) MCG/ACT inhaler Inhale 2 puffs into the lungs every 4 (four) hours as needed for wheezing or shortness of breath.    . magic mouthwash w/lidocaine SOLN Take 5 mLs by mouth 3 (three) times daily as needed. (Patient not taking: Reported on 05/31/2016) 180 mL 0   No facility-administered medications prior to visit.      EXAM:  BP 112/60 (BP Location: Left Arm, Patient Position: Sitting, Cuff Size: Large)   Pulse 72   Temp 97.5 F (36.4 C) (Oral)   Ht 5\' 1"  (1.549 m)   Wt 163 lb 9.6 oz (74.2 kg)   SpO2 98%   BMI  30.91 kg/m   Body mass index is 30.91 kg/m.  GENERAL: vitals reviewed and listed above, alert, oriented, appears well hydrated and in no acute distressNontoxic no respiratory distress. HEENT: atraumatic, conjunctiva  clear, no obvious abnormalities on inspection of external nose and ears TMs have normal landmarks and airways mild congestion right maxilla mild tenderness no swelling noted. OP : no lesion edema or exudate  NECK: no obvious masses on inspection palpation  LUNGS: clear to auscultation bilaterally, no wheezes, rales or rhonchi, good air movement CV: HRRR, no clubbing cyanosis or  peripheral edema nl cap refill  PSYCH: pleasant and cooperative, no obvious depression or anxiety  ASSESSMENT AND PLAN:  Discussed the following assessment and plan:  Other acute sinusitis, recurrence not specified - borderline risk benefot antibiotic pre surgical and holday weekend rx given to begin as instructed   Wheezing - hx asthma  bronchosapsm   nl exam today nl pulse ox but use rescue inhaler this weekend if neede d Counseled.    Expectant management.  -Patient advised to return or notify health care team  if symptoms worsen ,persist or new concerns arise.  Patient Instructions  This acts like a viral sinusitis that may be becoming a bacterial sinusitis. Continue your Flonase and saline nose spray and maybe 2 days of decongestant nose spray such as generic Afrin to relieve the pressure. If it is not improving in the next 3-5 days then add the antibiotic that we discussed. Your chest exam is clear today but usually her albuterol as needed for wheezing. If that is getting worse contact us next week.    Sinusitis, Adult Sinusitis is soreness and inflammation of your sinuses. Sinuses are hollow spaces in the bones around your face. Your sinuses are located:  Around your eyes.  In the middle of your forehead.  Behind your nose.  In your cheekbones. Your sinuses and nasal passages are lined with a stringy fluid (mucus). Mucus normally drains out of your sinuses. When your nasal tissues become inflamed or swollen, the mucus can become trapped or blocked so air cannot flow through your sinuses. This allows bacteria, viruses, and funguses to grow, which leads to infection. Sinusitis can develop quickly and last for 7?10 days (acute) or for more than 12 weeks (chronic). Sinusitis often develops after a cold. What are the causes? This condition is caused by anything that creates swelling in the sinuses or stops mucus from draining, including:  Allergies.  Asthma.  Bacterial or viral infection.  Abnormally shaped bones between the nasal passages.  Nasal growths that contain mucus (nasal polyps).  Narrow sinus openings.  Pollutants, such as chemicals or irritants in the air.  A foreign object stuck in the nose.  A fungal infection. This is rare. What increases the risk? The following factors may make you more likely to develop this condition:  Having allergies or asthma.  Having had a recent cold or  respiratory tract infection.  Having structural deformities or blockages in your nose or sinuses.  Having a weak immune system.  Doing a lot of swimming or diving.  Overusing nasal sprays.  Smoking. What are the signs or symptoms? The main symptoms of this condition are pain and a feeling of pressure around the affected sinuses. Other symptoms include:  Upper toothache.  Earache.  Headache.  Bad breath.  Decreased sense of smell and taste.  A cough that may get worse at night.  Fatigue.  Fever.  Thick drainage from your nose. The drainage  is often green and it may contain pus (purulent).  Stuffy nose or congestion.  Postnasal drip. This is when extra mucus collects in the throat or back of the nose.  Swelling and warmth over the affected sinuses.  Sore throat.  Sensitivity to light. How is this diagnosed? This condition is diagnosed based on symptoms, a medical history, and a physical exam. To find out if your condition is acute or chronic, your health care provider may:  Look in your nose for signs of nasal polyps.  Tap over the affected sinus to check for signs of infection.  View the inside of your sinuses using an imaging device that has a light attached (endoscope). If your health care provider suspects that you have chronic sinusitis, you may also:  Be tested for allergies.  Have a sample of mucus taken from your nose (nasal culture) and checked for bacteria.  Have a mucus sample examined to see if your sinusitis is related to an allergy. If your sinusitis does not respond to treatment and it lasts longer than 8 weeks, you may have an MRI or CT scan to check your sinuses. These scans also help to determine how severe your infection is. In rare cases, a bone biopsy may be done to rule out more serious types of fungal sinus disease. How is this treated? Treatment for sinusitis depends on the cause and whether your condition is chronic or acute. If a  virus is causing your sinusitis, your symptoms will go away on their own within 10 days. You may be given medicines to relieve your symptoms, including:  Topical nasal decongestants. They shrink swollen nasal passages and let mucus drain from your sinuses.  Antihistamines. These drugs block inflammation that is triggered by allergies. This can help to ease swelling in your nose and sinuses.  Topical nasal corticosteroids. These are nasal sprays that ease inflammation and swelling in your nose and sinuses.  Nasal saline washes. These rinses can help to get rid of thick mucus in your nose. If your condition is caused by bacteria, you will be given an antibiotic medicine. If your condition is caused by a fungus, you will be given an antifungal medicine. Surgery may be needed to correct underlying conditions, such as narrow nasal passages. Surgery may also be needed to remove polyps. Follow these instructions at home: Medicines  Take, use, or apply over-the-counter and prescription medicines only as told by your health care provider. These may include nasal sprays.  If you were prescribed an antibiotic medicine, take it as told by your health care provider. Do not stop taking the antibiotic even if you start to feel better. Hydrate and Humidify  Drink enough water to keep your urine clear or pale yellow. Staying hydrated will help to thin your mucus.  Use a cool mist humidifier to keep the humidity level in your home above 50%.  Inhale steam for 10-15 minutes, 3-4 times a day or as told by your health care provider. You can do this in the bathroom while a hot shower is running.  Limit your exposure to cool or dry air. Rest  Rest as much as possible.  Sleep with your head raised (elevated).  Make sure to get enough sleep each night. General instructions  Apply a warm, moist washcloth to your face 3-4 times a day or as told by your health care provider. This will help with  discomfort.  Wash your hands often with soap and water to reduce your exposure to  viruses and other germs. If soap and water are not available, use hand sanitizer.  Do not smoke. Avoid being around people who are smoking (secondhand smoke).  Keep all follow-up visits as told by your health care provider. This is important. Contact a health care provider if:  You have a fever.  Your symptoms get worse.  Your symptoms do not improve within 10 days. Get help right away if:  You have a severe headache.  You have persistent vomiting.  You have pain or swelling around your face or eyes.  You have vision problems.  You develop confusion.  Your neck is stiff.  You have trouble breathing. This information is not intended to replace advice given to you by your health care provider. Make sure you discuss any questions you have with your health care provider. Document Released: 06/28/2005 Document Revised: 02/22/2016 Document Reviewed: 04/23/2015 Elsevier Interactive Patient Education  2017 Zebulon K. Panosh M.D.

## 2016-06-01 NOTE — Patient Instructions (Signed)
This acts like a viral sinusitis that may be becoming a bacterial sinusitis. Continue your Flonase and saline nose spray and maybe 2 days of decongestant nose spray such as generic Afrin to relieve the pressure. If it is not improving in the next 3-5 days then add the antibiotic that we discussed. Your chest exam is clear today but usually her albuterol as needed for wheezing. If that is getting worse contact us next week.    Sinusitis, Adult Sinusitis is soreness and inflammation of your sinuses. Sinuses are hollow spaces in the bones around your face. Your sinuses are located:  Around your eyes.  In the middle of your forehead.  Behind your nose.  In your cheekbones. Your sinuses and nasal passages are lined with a stringy fluid (mucus). Mucus normally drains out of your sinuses. When your nasal tissues become inflamed or swollen, the mucus can become trapped or blocked so air cannot flow through your sinuses. This allows bacteria, viruses, and funguses to grow, which leads to infection. Sinusitis can develop quickly and last for 7?10 days (acute) or for more than 12 weeks (chronic). Sinusitis often develops after a cold. What are the causes? This condition is caused by anything that creates swelling in the sinuses or stops mucus from draining, including:  Allergies.  Asthma.  Bacterial or viral infection.  Abnormally shaped bones between the nasal passages.  Nasal growths that contain mucus (nasal polyps).  Narrow sinus openings.  Pollutants, such as chemicals or irritants in the air.  A foreign object stuck in the nose.  A fungal infection. This is rare. What increases the risk? The following factors may make you more likely to develop this condition:  Having allergies or asthma.  Having had a recent cold or respiratory tract infection.  Having structural deformities or blockages in your nose or sinuses.  Having a weak immune system.  Doing a lot of swimming or  diving.  Overusing nasal sprays.  Smoking. What are the signs or symptoms? The main symptoms of this condition are pain and a feeling of pressure around the affected sinuses. Other symptoms include:  Upper toothache.  Earache.  Headache.  Bad breath.  Decreased sense of smell and taste.  A cough that may get worse at night.  Fatigue.  Fever.  Thick drainage from your nose. The drainage is often green and it may contain pus (purulent).  Stuffy nose or congestion.  Postnasal drip. This is when extra mucus collects in the throat or back of the nose.  Swelling and warmth over the affected sinuses.  Sore throat.  Sensitivity to light. How is this diagnosed? This condition is diagnosed based on symptoms, a medical history, and a physical exam. To find out if your condition is acute or chronic, your health care provider may:  Look in your nose for signs of nasal polyps.  Tap over the affected sinus to check for signs of infection.  View the inside of your sinuses using an imaging device that has a light attached (endoscope). If your health care provider suspects that you have chronic sinusitis, you may also:  Be tested for allergies.  Have a sample of mucus taken from your nose (nasal culture) and checked for bacteria.  Have a mucus sample examined to see if your sinusitis is related to an allergy. If your sinusitis does not respond to treatment and it lasts longer than 8 weeks, you may have an MRI or CT scan to check your sinuses. These scans also  help to determine how severe your infection is. In rare cases, a bone biopsy may be done to rule out more serious types of fungal sinus disease. How is this treated? Treatment for sinusitis depends on the cause and whether your condition is chronic or acute. If a virus is causing your sinusitis, your symptoms will go away on their own within 10 days. You may be given medicines to relieve your symptoms, including:  Topical  nasal decongestants. They shrink swollen nasal passages and let mucus drain from your sinuses.  Antihistamines. These drugs block inflammation that is triggered by allergies. This can help to ease swelling in your nose and sinuses.  Topical nasal corticosteroids. These are nasal sprays that ease inflammation and swelling in your nose and sinuses.  Nasal saline washes. These rinses can help to get rid of thick mucus in your nose. If your condition is caused by bacteria, you will be given an antibiotic medicine. If your condition is caused by a fungus, you will be given an antifungal medicine. Surgery may be needed to correct underlying conditions, such as narrow nasal passages. Surgery may also be needed to remove polyps. Follow these instructions at home: Medicines  Take, use, or apply over-the-counter and prescription medicines only as told by your health care provider. These may include nasal sprays.  If you were prescribed an antibiotic medicine, take it as told by your health care provider. Do not stop taking the antibiotic even if you start to feel better. Hydrate and Humidify  Drink enough water to keep your urine clear or pale yellow. Staying hydrated will help to thin your mucus.  Use a cool mist humidifier to keep the humidity level in your home above 50%.  Inhale steam for 10-15 minutes, 3-4 times a day or as told by your health care provider. You can do this in the bathroom while a hot shower is running.  Limit your exposure to cool or dry air. Rest  Rest as much as possible.  Sleep with your head raised (elevated).  Make sure to get enough sleep each night. General instructions  Apply a warm, moist washcloth to your face 3-4 times a day or as told by your health care provider. This will help with discomfort.  Wash your hands often with soap and water to reduce your exposure to viruses and other germs. If soap and water are not available, use hand sanitizer.  Do not  smoke. Avoid being around people who are smoking (secondhand smoke).  Keep all follow-up visits as told by your health care provider. This is important. Contact a health care provider if:  You have a fever.  Your symptoms get worse.  Your symptoms do not improve within 10 days. Get help right away if:  You have a severe headache.  You have persistent vomiting.  You have pain or swelling around your face or eyes.  You have vision problems.  You develop confusion.  Your neck is stiff.  You have trouble breathing. This information is not intended to replace advice given to you by your health care provider. Make sure you discuss any questions you have with your health care provider. Document Released: 06/28/2005 Document Revised: 02/22/2016 Document Reviewed: 04/23/2015 Elsevier Interactive Patient Education  2017 [employer].

## 2016-06-02 NOTE — Pre-Procedure Instructions (Signed)
AMANI ALPER  06/02/2016      [place], [place] [address] [place] [address] Phone: [phone] Fax: [phone]    Your procedure is scheduled on Tues, Dec 5 @ 7:30 AM  Report to [facility] Admitting at 5:30 AM  Call this number if you have problems the morning of surgery:  [phone]   Remember:  Do not eat food or drink liquids after midnight.  Take these medicines the morning of surgery with A SIP OF WATER Albuterol<Bring Your Inhaler With You>,Flonase(Fluticasone),and Tramadol(Ultram)             No Goody's,BC's,Aleve,Advil,Motrin,Ibuprofen,Fish Oil,or any Herbal Medications.    Do not wear jewelry, make-up or nail polish.  Do not wear lotions, powders, or perfumes, or deoderant.  Do not shave 48 hours prior to surgery.  Men may shave face and neck.  Do not bring valuables to the hospital.  [facility] is not responsible for any belongings or valuables.  Contacts, dentures or bridgework may not be worn into surgery.  Leave your suitcase in the car.  After surgery it may be brought to your room.  For patients admitted to the hospital, discharge time will be determined by your treatment team.  Patients discharged the day of surgery will not be allowed to drive home.    [facility]  Before surgery, you can play an important role.  Because skin is not sterile, your skin needs to be as free of germs as possible.  You can reduce the number of germs on you skin by washing with CHG (chlorahexidine gluconate) soap before surgery.  CHG is an antiseptic cleaner which kills germs and bonds with the skin to continue killing germs even after washing.  Please DO NOT use if you have an allergy to CHG or antibacterial soaps.  If your skin becomes reddened/irritated stop using the CHG and inform your nurse when you arrive at Short Stay.  Do not shave (including  legs and underarms) for at least 48 hours prior to the first CHG shower.  You may shave your face.  Please follow these instructions carefully:   1.  Shower with CHG Soap the night before surgery and the                                morning of Surgery.  2.  If you choose to wash your hair, wash your hair first as usual with your       normal shampoo.  3.  After you shampoo, rinse your hair and body thoroughly to remove the                      Shampoo.  4.  Use CHG as you would any other liquid soap.  You can apply chg directly       to the skin and wash gently with scrungie or a clean washcloth.  5.  Apply the CHG Soap to your body ONLY FROM THE NECK DOWN.        Do not use on open wounds or open sores.  Avoid contact with your eyes,       ears, mouth and genitals (private parts).  Wash genitals (private parts)       with your normal soap.  6.  Wash thoroughly, paying  special attention to the area where your surgery        will be performed.  7.  Thoroughly rinse your body with warm water from the neck down.  8.  DO NOT shower/wash with your normal soap after using and rinsing off       the CHG Soap.  9.  Pat yourself dry with a clean towel.            10.  Wear clean pajamas.            11.  Place clean sheets on your bed the night of your first shower and do not        sleep with pets.  Day of Surgery  Do not apply any lotions/deoderants the morning of surgery.  Please wear clean clothes to the hospital/surgery center.    Please read over the following fact sheets that you were given. Pain Booklet, Coughing and Deep Breathing, MRSA Information and Surgical Site Infection Prevention

## 2016-06-04 ENCOUNTER — Encounter (HOSPITAL_COMMUNITY): Payer: Self-pay

## 2016-06-04 ENCOUNTER — Ambulatory Visit (HOSPITAL_COMMUNITY)
Admission: RE | Admit: 2016-06-04 | Discharge: 2016-06-04 | Disposition: A | Discharge status: Home / Self Care | Payer: Medicare Other | Source: Ambulatory Visit | Attending: Orthopaedic Surgery | Admitting: Orthopaedic Surgery

## 2016-06-04 ENCOUNTER — Encounter (HOSPITAL_COMMUNITY)
Admission: RE | Admit: 2016-06-04 | Discharge: 2016-06-04 | Disposition: A | Discharge status: Home / Self Care | Payer: Medicare Other | Source: Ambulatory Visit | Attending: Orthopaedic Surgery | Admitting: Orthopaedic Surgery

## 2016-06-04 DIAGNOSIS — M1711 Unilateral primary osteoarthritis, right knee: Secondary | ICD-10-CM | POA: Insufficient documentation

## 2016-06-04 DIAGNOSIS — Z01818 Encounter for other preprocedural examination: Secondary | ICD-10-CM | POA: Diagnosis not present

## 2016-06-04 DIAGNOSIS — Z01810 Encounter for preprocedural cardiovascular examination: Secondary | ICD-10-CM | POA: Diagnosis not present

## 2016-06-04 DIAGNOSIS — Z01812 Encounter for preprocedural laboratory examination: Secondary | ICD-10-CM | POA: Insufficient documentation

## 2016-06-04 HISTORY — DX: Pain in unspecified joint: M25.50

## 2016-06-04 HISTORY — DX: Unspecified osteoarthritis, unspecified site: M19.90

## 2016-06-04 HISTORY — DX: Dyspnea, unspecified: R06.00

## 2016-06-04 HISTORY — DX: Gastro-esophageal reflux disease without esophagitis: K21.9

## 2016-06-04 HISTORY — DX: Personal history of other diseases of the respiratory system: Z87.09

## 2016-06-04 HISTORY — DX: Personal history of other infectious and parasitic diseases: Z86.19

## 2016-06-04 HISTORY — DX: Effusion, unspecified joint: M25.40

## 2016-06-04 LAB — CBC WITH DIFFERENTIAL/PLATELET
BASOS PCT: 1 %
Basophils Absolute: 0 10*3/uL (ref 0.0–0.1)
EOS ABS: 0.5 10*3/uL (ref 0.0–0.7)
EOS PCT: 7 %
HCT: 40.6 % (ref 36.0–46.0)
Hemoglobin: 12.9 g/dL (ref 12.0–15.0)
LYMPHS ABS: 1.8 10*3/uL (ref 0.7–4.0)
Lymphocytes Relative: 26 %
MCH: 29.3 pg (ref 26.0–34.0)
MCHC: 31.8 g/dL (ref 30.0–36.0)
MCV: 92.1 fL (ref 78.0–100.0)
Monocytes Absolute: 0.9 10*3/uL (ref 0.1–1.0)
Monocytes Relative: 13 %
NEUTROS PCT: 53 %
Neutro Abs: 3.6 10*3/uL (ref 1.7–7.7)
PLATELETS: 258 10*3/uL (ref 150–400)
RBC: 4.41 MIL/uL (ref 3.87–5.11)
RDW: 14 % (ref 11.5–15.5)
WBC: 6.8 10*3/uL (ref 4.0–10.5)

## 2016-06-04 LAB — URINALYSIS, ROUTINE W REFLEX MICROSCOPIC
BILIRUBIN URINE: NEGATIVE
GLUCOSE, UA: NEGATIVE mg/dL
Hgb urine dipstick: NEGATIVE
Ketones, ur: NEGATIVE mg/dL
LEUKOCYTES UA: NEGATIVE
NITRITE: NEGATIVE
PH: 5 (ref 5.0–8.0)
Protein, ur: NEGATIVE mg/dL
SPECIFIC GRAVITY, URINE: 1.021 (ref 1.005–1.030)

## 2016-06-04 LAB — BASIC METABOLIC PANEL
Anion gap: 7 (ref 5–15)
BUN: 22 mg/dL — AB (ref 6–20)
CHLORIDE: 102 mmol/L (ref 101–111)
CO2: 30 mmol/L (ref 22–32)
CREATININE: 0.75 mg/dL (ref 0.44–1.00)
Calcium: 9.3 mg/dL (ref 8.9–10.3)
Glucose, Bld: 103 mg/dL — ABNORMAL HIGH (ref 65–99)
Potassium: 4.2 mmol/L (ref 3.5–5.1)
SODIUM: 139 mmol/L (ref 135–145)

## 2016-06-04 LAB — PROTIME-INR
INR: 0.99
Prothrombin Time: 13 seconds (ref 11.4–15.2)

## 2016-06-04 LAB — TYPE AND SCREEN
ABO/RH(D): O POS
ANTIBODY SCREEN: NEGATIVE

## 2016-06-04 LAB — ABO/RH: ABO/RH(D): O POS

## 2016-06-04 LAB — SURGICAL PCR SCREEN
MRSA, PCR: NEGATIVE
STAPHYLOCOCCUS AUREUS: NEGATIVE

## 2016-06-04 LAB — APTT: aPTT: 31 seconds (ref 24–36)

## 2016-06-04 MED ORDER — CHLORHEXIDINE GLUCONATE 4 % EX LIQD: 60.0000 mL | Freq: Once | CUTANEOUS | Status: DC

## 2016-06-04 NOTE — Progress Notes (Signed)
Cardiologist denies   Medical Md is Dr.Stephen Lowella Grip test/heart cath denies  EKG denies in past yr  CXR denies in past yr

## 2016-06-11 ENCOUNTER — Ambulatory Visit (INDEPENDENT_AMBULATORY_CARE_PROVIDER_SITE_OTHER): Payer: Medicare Other

## 2016-06-11 VITALS — BP 124/74 | HR 80 | Ht 61.5 in | Wt 161.2 lb

## 2016-06-11 DIAGNOSIS — H833X1 Noise effects on right inner ear: Secondary | ICD-10-CM | POA: Diagnosis not present

## 2016-06-11 DIAGNOSIS — Z0000 Encounter for general adult medical examination without abnormal findings: Secondary | ICD-10-CM

## 2016-06-11 NOTE — Progress Notes (Addendum)
Subjective:   Aimee Lawrence is a 70 y.o. female who presents for Medicare Annual (Subsequent) preventive examination.  The Patient was informed that the wellness visit is to identify future health risk and educate and initiate measures that can reduce risk for increased disease through the lifespan.    NO ROS; Medicare Wellness Visit Psychosocial  Single Friend with be her advocate while inpatient  Describes health as good, fair or great? good To have right knee surgery Dec 5th; TRK   Preventive Screening -Counseling & Management  Dexa; osteopenia; dexa 02/2015 -1.3/exercise on hold due to impending knee surgery Colonoscopy- Dr Carlean Purl- 10/22/2015 - due 10/2020 Mammogram-11/2014 - done at the breast center at [facility]  To call as breast cancer runs in her family and she thought she had one this year. Pap; 05/2010  Home two level home but bedroom downstairs  No walk -in shower  Will go to rehab post op prior to going home  Current smoking/ tobacco status/ never smoked  ETOH no  RISK FACTORS Regular exercise  Used to go to [employer] sneakers; 2 to 3 times a week    Diet would like to lose weight and was in weight watchers Eats Breakfast; oatmeal; yogurt, cereal or eggs Lunch; eats out; and eats leftovers for supper Eats with friends Plans to go back to weight watchers post op  Fall risk:  no  Mobility of Functional changes this year? Yes due to right knee  Safety; community, wears sunscreen - has spots and goes to dermatologist; , safe place for firearms; Motor vehicle accidents; no   Cardiac Risk Factors:  Advanced aged >65 in women Hyperlipidemia (hdl 8; LDL 109; a1c 5.9)  Family History (breast cancer and Pneumonia)  Obesity - 76   Eye exam/ once a year; Just had one;  Has not gotten rx filled as yet; no changes   Hearing right ear <1000 and will refer for hearing screen   Depression Screen- hx of  PhQ 2: negative  Activities of Daily Living - See functional  screen   Cognitive testing; Ad8 score; 0 or less than 2  MMSE deferred or completed if AD8 + 2 issues  Advanced Directives yes will bring a copy to the hospital   Patient Care Team: [name], MD as PCP - General     Immunization History  Administered Date(s) Administered  . Influenza,inj,Quad PF,36+ Mos 03/28/2013, 05/28/2014  . Influenza-Unspecified 05/11/2015  . Pneumococcal Conjugate-13 06/30/2015  . Pneumococcal Polysaccharide-23 07/13/2007  . Td 05/28/2010  . Zoster 07/12/2004   Required Immunizations needed today  Screening test up to date or reviewed for plan of completion There are no preventive care reminders to display for this patient.    Patient Care Team: [name], MD as PCP - General      Objective:     Vitals: BP 124/74   Pulse 80   Ht 5' 1.5" (1.562 m)   Wt 161 lb 3 oz (73.1 kg)   SpO2 96%   BMI 29.96 kg/m   Body mass index is 29.96 kg/m.   Tobacco History  Smoking Status  . Never Smoker  Smokeless Tobacco  . Never Used     Counseling given: Yes   Past Medical History:  Diagnosis Date  . Allergy    takes Doxycycline daily and uses Flonase daily  . Arthritis   . Asthma    Albuterol inhaler as needed.  Marland Kitchen Dyspnea    rarely and sitting  .  GERD (gastroesophageal reflux disease)    not on any meds  . History of bronchitis   . History of shingles   . Hx of colonic polyps    benign  . Joint pain   . Joint swelling   . Osteopenia    takes Vit D3 and Calcium daily   Past Surgical History:  Procedure Laterality Date  . CHOLECYSTECTOMY  2007   lap choli  . COLONOSCOPY  multiple since 2003   per Dr. Carlean Purl, adenomatous polyps, recall 2022  . NASAL POLYP SURGERY  2007  . SINUS ENDO W/FUSION  06/27/2012   Procedure: ENDOSCOPIC SINUS SURGERY WITH FUSION NAVIGATION;  Surgeon: Rozetta Nunnery, MD;  Location: [facility];  Service: ENT;  Laterality: N/A;  FESS FUSION PROTOCOL   Family History  Problem  Relation Age of Onset  . Alcohol abuse    . Arthritis    . Diabetes    . Heart disease    . Breast cancer Mother   . Pneumonia Mother   . Colon cancer Neg Hx   . Stomach cancer Neg Hx    History  Sexual Activity  . Sexual activity: Not on file    Outpatient Encounter Prescriptions as of 06/11/2016  Medication Sig  . albuterol (PROVENTIL HFA;VENTOLIN HFA) 108 (90 Base) MCG/ACT inhaler Inhale 2 puffs into the lungs every 6 (six) hours as needed for wheezing or shortness of breath.  . Calcium Carbonate-Vitamin D (CALCIUM 500/D PO) Take 2 tablets by mouth daily.  . Cholecalciferol (VITAMIN D3) 2000 units capsule Take 4,000 Units by mouth daily.  . fluticasone (FLONASE) 50 MCG/ACT nasal spray Place 2 sprays into both nostrils daily.  . traMADol (ULTRAM) 50 MG tablet Take 100 mg by mouth 2 (two) times daily.   . vitamin C (ASCORBIC ACID) 250 MG tablet Take 500 mg by mouth daily.  . [DISCONTINUED] doxycycline (VIBRA-TABS) 100 MG tablet Take 1 tablet (100 mg total) by mouth 2 (two) times daily. For sinusitis (Patient not taking: Reported on 06/11/2016)   No facility-administered encounter medications on file as of 06/11/2016.     Activities of Daily Living In your present state of health, do you have any difficulty performing the following activities: 06/11/2016 06/04/2016  Hearing? Y N  Vision? N N  Difficulty concentrating or making decisions? N N  Walking or climbing stairs? Y Y  Dressing or bathing? N N  Doing errands, shopping? N N  Preparing Food and eating ? N -  Using the Toilet? N -  In the past six months, have you accidently leaked urine? N -  Do you have problems with loss of bowel control? N -  Managing your Medications? N -  Managing your Finances? N -  Housekeeping or managing your Housekeeping? N -  Some recent data might be hidden    Patient Care Team: [name], MD as PCP - General    Assessment:   Exercise Activities and Dietary recommendations Current  Exercise Habits: Home exercise routine, Intensity: Mild  Goals    . Weight (lb) < 140 lb (63.5 kg)          Continue Weight Watchers      Fall Risk Fall Risk  06/11/2016 06/03/2015 05/28/2014  Falls in the past year? No No No   Depression Screen PHQ 2/9 Scores 06/11/2016 06/03/2015 05/28/2014  PHQ - 2 Score 0 0 0     Cognitive Function; Ad8 score is 0 MMSE - Mini Mental State  Exam 06/11/2016  Not completed: (No Data)        Immunization History  Administered Date(s) Administered  . Influenza,inj,Quad PF,36+ Mos 03/28/2013, 05/28/2014  . Influenza-Unspecified 05/11/2015  . Pneumococcal Conjugate-13 06/30/2015  . Pneumococcal Polysaccharide-23 07/13/2007  . Td 05/28/2010  . Zoster 07/12/2004   Screening Tests Health Maintenance  Topic Date Due  . PNA vac Low Risk Adult (2 of 2 - PPSV23) 06/29/2016  . MAMMOGRAM  11/13/2016  . TETANUS/TDAP  05/28/2020  . COLONOSCOPY  10/21/2020  . INFLUENZA VACCINE  Completed  . DEXA SCAN  Completed  . ZOSTAVAX  Completed  . Hepatitis C Screening  Completed      Plan:      PCP Notes  Health Maintenance Patient concerns; hx breast cancer; not sure if she had mammogram this year; will contact the Breast center for outreach  Abnormal Screens  Hearing screen <1000 in right ear; agreed to referral to audiologist  Nurse Concerns; will fup on mammogram     During the course of the visit the patient was educated and counseled about the following appropriate screening and preventive services:   Vaccines to include Pneumoccal, Influenza, Hepatitis B, Td, Zostavax, HCV  Electrocardiogram  Cardiovascular Disease /neg  Colorectal cancer screening 10/2015   Bone density screening/ osteopenia; takes calcium and vit d  Diabetes screening-educated on prediabetes and given screens; numbers low BS<115 ; a1c 5.9  Glaucoma screening/   Mammography/to call and fup   Nutrition counseling/ overall good; to join weight watchers post  tkr next week   Patient Instructions (the written plan) was given to the patient.   [id number], RN  06/11/2016  I have reviewed this note and agree with its contents.  [name], MD

## 2016-06-11 NOTE — Patient Instructions (Addendum)
Aimee Lawrence , Thank you for taking time to come for your Medicare Wellness Visit. I appreciate your ongoing commitment to your health goals. Please review the following plan we discussed and let me know if I can assist you in the future.   Educated regarding prediabetes and numbers;  A1c ranges from 5.8 to 6.5 or fasting Blood sugar > 115 -126; (126 is diabetic)   Risk: >72yo; family hx; overweight or obese; African American; Hispanic; Latino; American [place] American; [place]; history of diabetes when pregnant; or birth to a baby weighing over 9 lbs. Being less physically active than 30 minutes; 3 times a week;   Prevention; Losing a modest 7 to 8 lbs; If over 200 lbs; 10 to 14 lbs;  Choose healthier foods; colorful veggies; fish or lean meats; drinks water Reduce portion size Start exercising; 30 minutes of fast walking x 30 minutes per day/ 60 min for weight loss    [facility] - can assist with hearing aid x 1  No reviews  [name]  [place] [address]  [phone]  [name] will call the [facility] center and have them call you for apt if you did not have one this year     These are the goals we discussed: Goals    . Weight (lb) < 140 lb (63.5 kg)          Continue Weight Watchers       This is a list of the screening recommended for you and due dates:  Health Maintenance  Topic Date Due  . Pneumonia vaccines (2 of 2 - PPSV23) 06/29/2016  . Mammogram  11/13/2016  . Tetanus Vaccine  05/28/2020  . Colon Cancer Screening  10/21/2020  . Flu Shot  Completed  . DEXA scan (bone density measurement)  Completed  . Shingles Vaccine  Completed  .  Hepatitis C: One time screening is recommended by Center for Disease Control  (CDC) for  adults born from [birth year] through 1967.   Completed        Fall Prevention in the Home Introduction Falls can cause injuries. They can happen to people of all ages. There are many things you  can do to make your home safe and to help prevent falls. What can I do on the outside of my home?  Regularly fix the edges of walkways and driveways and fix any cracks.  Remove anything that might make you trip as you walk through a door, such as a raised step or threshold.  Trim any bushes or trees on the path to your home.  Use bright outdoor lighting.  Clear any walking paths of anything that might make someone trip, such as rocks or tools.  Regularly check to see if handrails are loose or broken. Make sure that both sides of any steps have handrails.  Any raised decks and porches should have guardrails on the edges.  Have any leaves, snow, or ice cleared regularly.  Use sand or salt on walking paths during winter.  Clean up any spills in your garage right away. This includes oil or grease spills. What can I do in the bathroom?  Use night lights.  Install grab bars by the toilet and in the tub and shower. Do not use towel bars as grab bars.  Use non-skid mats or decals in the tub or shower.  If you need to sit down in the shower, use a plastic, non-slip  stool.  Keep the floor dry. Clean up any water that spills on the floor as soon as it happens.  Remove soap buildup in the tub or shower regularly.  Attach bath mats securely with double-sided non-slip rug tape.  Do not have throw rugs and other things on the floor that can make you trip. What can I do in the bedroom?  Use night lights.  Make sure that you have a light by your bed that is easy to reach.  Do not use any sheets or blankets that are too big for your bed. They should not hang down onto the floor.  Have a firm chair that has side arms. You can use this for support while you get dressed.  Do not have throw rugs and other things on the floor that can make you trip. What can I do in the kitchen?  Clean up any spills right away.  Avoid walking on wet floors.  Keep items that you use a lot in  easy-to-reach places.  If you need to reach something above you, use a strong step stool that has a grab bar.  Keep electrical cords out of the way.  Do not use floor polish or wax that makes floors slippery. If you must use wax, use non-skid floor wax.  Do not have throw rugs and other things on the floor that can make you trip. What can I do with my stairs?  Do not leave any items on the stairs.  Make sure that there are handrails on both sides of the stairs and use them. Fix handrails that are broken or loose. Make sure that handrails are as long as the stairways.  Check any carpeting to make sure that it is firmly attached to the stairs. Fix any carpet that is loose or worn.  Avoid having throw rugs at the top or bottom of the stairs. If you do have throw rugs, attach them to the floor with carpet tape.  Make sure that you have a light switch at the top of the stairs and the bottom of the stairs. If you do not have them, ask someone to add them for you. What else can I do to help prevent falls?  Wear shoes that:  Do not have high heels.  Have rubber bottoms.  Are comfortable and fit you well.  Are closed at the toe. Do not wear sandals.  If you use a stepladder:  Make sure that it is fully opened. Do not climb a closed stepladder.  Make sure that both sides of the stepladder are locked into place.  Ask someone to hold it for you, if possible.  Clearly mark and make sure that you can see:  Any grab bars or handrails.  First and last steps.  Where the edge of each step is.  Use tools that help you move around (mobility aids) if they are needed. These include:  Canes.  Walkers.  Scooters.  Crutches.  Turn on the lights when you go into a dark area. Replace any light bulbs as soon as they burn out.  Set up your furniture so you have a clear path. Avoid moving your furniture around.  If any of your floors are uneven, fix them.  If there are any pets  around you, be aware of where they are.  Review your medicines with your doctor. Some medicines can make you feel dizzy. This can increase your chance of falling. Ask your doctor what other things that  you can do to help prevent falls. This information is not intended to replace advice given to you by your health care provider. Make sure you discuss any questions you have with your health care provider. Document Released: 04/24/2009 Document Revised: 12/04/2015 Document Reviewed: 08/02/2014  2017 Elsevier  Health Maintenance, Female Introduction Adopting a healthy lifestyle and getting preventive care can go a long way to promote health and wellness. Talk with your health care provider about what schedule of regular examinations is right for you. This is a good chance for you to check in with your provider about disease prevention and staying healthy. In between checkups, there are plenty of things you can do on your own. Experts have done a lot of research about which lifestyle changes and preventive measures are most likely to keep you healthy. Ask your health care provider for more information. Weight and diet Eat a healthy diet  Be sure to include plenty of vegetables, fruits, low-fat dairy products, and lean protein.  Do not eat a lot of foods high in solid fats, added sugars, or salt.  Get regular exercise. This is one of the most important things you can do for your health.  Most adults should exercise for at least 150 minutes each week. The exercise should increase your heart rate and make you sweat (moderate-intensity exercise).  Most adults should also do strengthening exercises at least twice a week. This is in addition to the moderate-intensity exercise. Maintain a healthy weight  Body mass index (BMI) is a measurement that can be used to identify possible weight problems. It estimates body fat based on height and weight. Your health care provider can help determine your BMI  and help you achieve or maintain a healthy weight.  For females 26 years of age and older:  A BMI below 18.5 is considered underweight.  A BMI of 18.5 to 24.9 is normal.  A BMI of 25 to 29.9 is considered overweight.  A BMI of 30 and above is considered obese. Watch levels of cholesterol and blood lipids  You should start having your blood tested for lipids and cholesterol at 72 years of age, then have this test every 5 years.  You may need to have your cholesterol levels checked more often if:  Your lipid or cholesterol levels are high.  You are older than 72 years of age.  You are at high risk for heart disease. Cancer screening Lung Cancer  Lung cancer screening is recommended for adults 32-49 years old who are at high risk for lung cancer because of a history of smoking.  A yearly low-dose CT scan of the lungs is recommended for people who:  Currently smoke.  Have quit within the past 15 years.  Have at least a 30-pack-year history of smoking. A pack year is smoking an average of one pack of cigarettes a day for 1 year.  Yearly screening should continue until it has been 15 years since you quit.  Yearly screening should stop if you develop a health problem that would prevent you from having lung cancer treatment. Breast Cancer  Practice breast self-awareness. This means understanding how your breasts normally appear and feel.  It also means doing regular breast self-exams. Let your health care provider know about any changes, no matter how small.  If you are in your 20s or 30s, you should have a clinical breast exam (CBE) by a health care provider every 1-3 years as part of a regular health exam.  If you are 40 or older, have a CBE every year. Also consider having a breast X-ray (mammogram) every year.  If you have a family history of breast cancer, talk to your health care provider about genetic screening.  If you are at high risk for breast cancer, talk to your  health care provider about having an MRI and a mammogram every year.  Breast cancer gene (BRCA) assessment is recommended for women who have family members with BRCA-related cancers. BRCA-related cancers include:  Breast.  Ovarian.  Tubal.  Peritoneal cancers.  Results of the assessment will determine the need for genetic counseling and BRCA1 and BRCA2 testing. Cervical Cancer  Your health care provider may recommend that you be screened regularly for cancer of the pelvic organs (ovaries, uterus, and vagina). This screening involves a pelvic examination, including checking for microscopic changes to the surface of your cervix (Pap test). You may be encouraged to have this screening done every 3 years, beginning at age 38.  For women ages 48-65, health care providers may recommend pelvic exams and Pap testing every 3 years, or they may recommend the Pap and pelvic exam, combined with testing for human papilloma virus (HPV), every 5 years. Some types of HPV increase your risk of cervical cancer. Testing for HPV may also be done on women of any age with unclear Pap test results.  Other health care providers may not recommend any screening for nonpregnant women who are considered low risk for pelvic cancer and who do not have symptoms. Ask your health care provider if a screening pelvic exam is right for you.  If you have had past treatment for cervical cancer or a condition that could lead to cancer, you need Pap tests and screening for cancer for at least 20 years after your treatment. If Pap tests have been discontinued, your risk factors (such as having a new sexual partner) need to be reassessed to determine if screening should resume. Some women have medical problems that increase the chance of getting cervical cancer. In these cases, your health care provider may recommend more frequent screening and Pap tests. Colorectal Cancer  This type of cancer can be detected and often  prevented.  Routine colorectal cancer screening usually begins at 72 years of age and continues through 72 years of age.  Your health care provider may recommend screening at an earlier age if you have risk factors for colon cancer.  Your health care provider may also recommend using home test kits to check for hidden blood in the stool.  A small camera at the end of a tube can be used to examine your colon directly (sigmoidoscopy or colonoscopy). This is done to check for the earliest forms of colorectal cancer.  Routine screening usually begins at age 79.  Direct examination of the colon should be repeated every 5-10 years through 72 years of age. However, you may need to be screened more often if early forms of precancerous polyps or small growths are found. Skin Cancer  Check your skin from head to toe regularly.  Tell your health care provider about any new moles or changes in moles, especially if there is a change in a mole's shape or color.  Also tell your health care provider if you have a mole that is larger than the size of a pencil eraser.  Always use sunscreen. Apply sunscreen liberally and repeatedly throughout the day.  Protect yourself by wearing long sleeves, pants, a wide-brimmed hat, and sunglasses whenever  you are outside. Heart disease, diabetes, and high blood pressure  High blood pressure causes heart disease and increases the risk of stroke. High blood pressure is more likely to develop in:  People who have blood pressure in the high end of the normal range (130-139/85-89 mm Hg).  People who are overweight or obese.  People who are African American.  If you are 73-19 years of age, have your blood pressure checked every 3-5 years. If you are 88 years of age or older, have your blood pressure checked every year. You should have your blood pressure measured twice-once when you are at a hospital or clinic, and once when you are not at a hospital or clinic. Record  the average of the two measurements. To check your blood pressure when you are not at a hospital or clinic, you can use:  An automated blood pressure machine at a pharmacy.  A home blood pressure monitor.  If you are between 77 years and 58 years old, ask your health care provider if you should take aspirin to prevent strokes.  Have regular diabetes screenings. This involves taking a blood sample to check your fasting blood sugar level.  If you are at a normal weight and have a low risk for diabetes, have this test once every three years after 72 years of age.  If you are overweight and have a high risk for diabetes, consider being tested at a younger age or more often. Preventing infection Hepatitis B  If you have a higher risk for hepatitis B, you should be screened for this virus. You are considered at high risk for hepatitis B if:  You were born in a country where hepatitis B is common. Ask your health care provider which countries are considered high risk.  Your parents were born in a high-risk country, and you have not been immunized against hepatitis B (hepatitis B vaccine).  You have HIV or AIDS.  You use needles to inject street drugs.  You live with someone who has hepatitis B.  You have had sex with someone who has hepatitis B.  You get hemodialysis treatment.  You take certain medicines for conditions, including cancer, organ transplantation, and autoimmune conditions. Hepatitis C  Blood testing is recommended for:  Everyone born from [birth year] through 1967.  Anyone with known risk factors for hepatitis C. Sexually transmitted infections (STIs)  You should be screened for sexually transmitted infections (STIs) including gonorrhea and chlamydia if:  You are sexually active and are younger than 72 years of age.  You are older than 72 years of age and your health care provider tells you that you are at risk for this type of infection.  Your sexual activity has  changed since you were last screened and you are at an increased risk for chlamydia or gonorrhea. Ask your health care provider if you are at risk.  If you do not have HIV, but are at risk, it may be recommended that you take a prescription medicine daily to prevent HIV infection. This is called pre-exposure prophylaxis (PrEP). You are considered at risk if:  You are sexually active and do not regularly use condoms or know the HIV status of your partner(s).  You take drugs by injection.  You are sexually active with a partner who has HIV. Talk with your health care provider about whether you are at high risk of being infected with HIV. If you choose to begin PrEP, you should first be tested for HIV.  You should then be tested every 3 months for as long as you are taking PrEP. Pregnancy  If you are premenopausal and you may become pregnant, ask your health care provider about preconception counseling.  If you may become pregnant, take 400 to 800 micrograms (mcg) of folic acid every day.  If you want to prevent pregnancy, talk to your health care provider about birth control (contraception). Osteoporosis and menopause  Osteoporosis is a disease in which the bones lose minerals and strength with aging. This can result in serious bone fractures. Your risk for osteoporosis can be identified using a bone density scan.  If you are 66 years of age or older, or if you are at risk for osteoporosis and fractures, ask your health care provider if you should be screened.  Ask your health care provider whether you should take a calcium or vitamin D supplement to lower your risk for osteoporosis.  Menopause may have certain physical symptoms and risks.  Hormone replacement therapy may reduce some of these symptoms and risks. Talk to your health care provider about whether hormone replacement therapy is right for you. Follow these instructions at home:  Schedule regular health, dental, and eye  exams.  Stay current with your immunizations.  Do not use any tobacco products including cigarettes, chewing tobacco, or electronic cigarettes.  If you are pregnant, do not drink alcohol.  If you are breastfeeding, limit how much and how often you drink alcohol.  Limit alcohol intake to no more than 1 drink per day for nonpregnant women. One drink equals 12 ounces of beer, 5 ounces of wine, or 1 ounces of hard liquor.  Do not use street drugs.  Do not share needles.  Ask your health care provider for help if you need support or information about quitting drugs.  Tell your health care provider if you often feel depressed.  Tell your health care provider if you have ever been abused or do not feel safe at home. This information is not intended to replace advice given to you by your health care provider. Make sure you discuss any questions you have with your health care provider. Document Released: 01/11/2011 Document Revised: 12/04/2015 Document Reviewed: 04/01/2015  2017 [employer]   Hearing Loss Introduction Hearing loss is a partial or total loss of the ability to hear. This can be temporary or permanent, and it can happen in one or both ears. Hearing loss may be referred to as deafness. Medical care is necessary to treat hearing loss properly and to prevent the condition from getting worse. Your hearing may partially or completely come back, depending on what caused your hearing loss and how severe it is. In some cases, hearing loss is permanent. What are the causes? Common causes of hearing loss include:  Too much wax in the ear canal.  Infection of the ear canal or middle ear.  Fluid in the middle ear.  Injury to the ear or surrounding area.  An object stuck in the ear.  Prolonged exposure to loud sounds, such as music. Less common causes of hearing loss include:  Tumors in the ear.  Viral or bacterial infections, such as meningitis.  A hole in the eardrum  (perforated eardrum).  Problems with the hearing nerve that sends signals between the brain and the ear.  Certain medicines. What are the signs or symptoms? Symptoms of this condition may include:  Difficulty telling the difference between sounds.  Difficulty following a conversation when there is background noise.  Lack of response to sounds in your environment. This may be most noticeable when you do not respond to startling sounds.  Needing to turn up the volume on the television, radio, etc.  Ringing in the ears.  Dizziness.  Pain in the ears. How is this diagnosed? This condition is diagnosed based on a physical exam and a hearing test (audiometry). The audiometry test will be performed by a hearing specialist (audiologist). You may also be referred to an ear, nose, and throat (ENT) specialist (otolaryngologist). How is this treated? Treatment for recent onset of hearing loss may include:  Ear wax removal.  Being prescribed medicines to prevent infection (antibiotics).  Being prescribed medicines to reduce inflammation (corticosteroids). Follow these instructions at home:  If you were prescribed an antibiotic medicine, take it as told by your health care provider. Do not stop taking the antibiotic even if you start to feel better.  Take over-the-counter and prescription medicines only as told by your health care provider.  Avoid loud noises.  Return to your normal activities as told by your health care provider. Ask your health care provider what activities are safe for you.  Keep all follow-up visits as told by your health care provider. This is important. Contact a health care provider if:  You feel dizzy.  You develop new symptoms.  You vomit or feel nauseous.  You have a fever. Get help right away if:  You develop sudden changes in your vision.  You have severe ear pain.  You have new or increased weakness.  You have a severe headache. This  information is not intended to replace advice given to you by your health care provider. Make sure you discuss any questions you have with your health care provider. Document Released: 06/28/2005 Document Revised: 12/04/2015 Document Reviewed: 11/13/2014  2017 Elsevier

## 2016-06-14 MED ORDER — LACTATED RINGERS IV SOLN
INTRAVENOUS | Status: DC
  Administered 2016-06-15: 07:00:00 via INTRAVENOUS

## 2016-06-14 MED ORDER — VANCOMYCIN HCL IN DEXTROSE 1-5 GM/200ML-% IV SOLN
1000.0000 mg | INTRAVENOUS | Status: AC
  Administered 2016-06-15: 1000 mg via INTRAVENOUS
  Filled 2016-06-14: qty 200

## 2016-06-14 NOTE — Anesthesia Preprocedure Evaluation (Addendum)
Anesthesia Evaluation  Patient identified by MRN, date of birth, ID band Patient awake    Reviewed: Allergy & Precautions, NPO status , Patient's Chart, lab work & pertinent test results  Airway Mallampati: II  TM Distance: >3 FB Neck ROM: Full    Dental   Pulmonary asthma ,    breath sounds clear to auscultation       Cardiovascular negative cardio ROS   Rhythm:Regular Rate:Normal     Neuro/Psych negative neurological ROS     GI/Hepatic Neg liver ROS, GERD  ,  Endo/Other  negative endocrine ROS  Renal/GU negative Renal ROS     Musculoskeletal   Abdominal   Peds  Hematology negative hematology ROS (+)   Anesthesia Other Findings   Reproductive/Obstetrics                            Lab Results  Component Value Date   WBC 6.8 06/04/2016   HGB 12.9 06/04/2016   HCT 40.6 06/04/2016   MCV 92.1 06/04/2016   PLT 258 06/04/2016   Lab Results  Component Value Date   CREATININE 0.75 06/04/2016   BUN 22 (H) 06/04/2016   NA 139 06/04/2016   K 4.2 06/04/2016   CL 102 06/04/2016   CO2 30 06/04/2016   Lab Results  Component Value Date   INR 0.99 06/04/2016    Anesthesia Physical Anesthesia Plan  ASA: II  Anesthesia Plan: MAC and Spinal   Post-op Pain Management:  Regional for Post-op pain   Induction: Intravenous  Airway Management Planned: Natural Airway and Simple Face Mask  Additional Equipment:   Intra-op Plan:   Post-operative Plan:   Informed Consent: I have reviewed the patients History and Physical, chart, labs and discussed the procedure including the risks, benefits and alternatives for the proposed anesthesia with the patient or authorized representative who has indicated his/her understanding and acceptance.     Plan Discussed with: CRNA  Anesthesia Plan Comments:        Anesthesia Quick Evaluation

## 2016-06-14 NOTE — H&P (Signed)
TOTAL KNEE ADMISSION H&P  Patient is being admitted for right total knee arthroplasty.  Subjective:  Chief Complaint:right knee pain.  HPI: Aimee Lawrence, 70 y.o. female, has a history of pain and functional disability in the right knee due to arthritis and has failed non-surgical conservative treatments for greater than 12 weeks to includeNSAID's and/or analgesics, corticosteriod injections, viscosupplementation injections, flexibility and strengthening excercises, supervised PT with diminished ADL's post treatment, use of assistive devices, weight reduction as appropriate and activity modification.  Onset of symptoms was gradual, starting 5 years ago with gradually worsening course since that time. The patient noted prior procedures on the knee to include  arthroscopy on the right knee(s).  Patient currently rates pain in the right knee(s) at 10 out of 10 with activity. Patient has night pain, worsening of pain with activity and weight bearing, pain that interferes with activities of daily living, pain with passive range of motion and joint swelling.  Patient has evidence of subchondral cysts, subchondral sclerosis, periarticular osteophytes and joint space narrowing by imaging studies. There is no active infection.  Patient Active Problem List   Diagnosis Date Noted  . Nasal polyps 11/20/2012  . 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 Essential hypertension 05/28/2010  . DEPRESSION 02/28/2007  . ASTHMA 02/28/2007  . OSTEOPENIA 02/28/2007  . Personal hx colon polyps 02/28/2007   Past Medical History:  Diagnosis Date  . Allergy    takes Doxycycline daily and uses Flonase daily  . Arthritis   . Asthma    Albuterol inhaler as needed.  Marland Kitchen Dyspnea    rarely and sitting  . GERD (gastroesophageal reflux disease)    not on any meds  . History of bronchitis   . History of shingles   . Hx of colonic polyps    benign  . Joint pain   . Joint swelling   . Osteopenia    takes Vit D3 and  Calcium daily    Past Surgical History:  Procedure Laterality Date  . CHOLECYSTECTOMY  2007   lap choli  . COLONOSCOPY  multiple since 2003   per Dr. Carlean Purl, adenomatous polyps, recall 2022  . NASAL POLYP SURGERY  2007  . SINUS ENDO W/FUSION  06/27/2012   Procedure: ENDOSCOPIC SINUS SURGERY WITH FUSION NAVIGATION;  Surgeon: Rozetta Nunnery, MD;  Location: [facility];  Service: ENT;  Laterality: N/A;  FESS FUSION PROTOCOL    No prescriptions prior to admission.   Allergies  Allergen Reactions  . Aleve [Naproxen Sodium] Shortness Of Breath  . Aspirin Shortness Of Breath    Trouble breathing  . Ibuprofen Shortness Of Breath    Short of breath  . Ampicillin Rash     Has patient had a PCN reaction causing immediate rash, facial/tongue/throat swelling, SOB or lightheadedness with hypotension:   # # UNKNOWN # #  Has patient had a PCN reaction that required hospitalization    # # NO # #  Has patient had a PCN reaction occurring within the last 10 years:    # # NO # #  If all of the above answers are "NO", then may proceed with Cephalosporin use.   . Erythromycin Nausea And Vomiting    Social History  Substance Use Topics  . Smoking status: Never Smoker  . Smokeless tobacco: Never Used  . Alcohol use No     Comment: no etoh     Family History  Problem Relation Age of Onset  . Alcohol abuse    .  Arthritis    . Diabetes    . Heart disease    . Breast cancer Mother   . Pneumonia Mother   . Colon cancer Neg Hx   . Stomach cancer Neg Hx      Review of Systems  Musculoskeletal: Positive for joint pain.       Right knee  All other systems reviewed and are negative.   Objective:  Physical Exam  Constitutional: She is oriented to person, place, and time. She appears well-developed and well-nourished.  HENT:  Head: Normocephalic and atraumatic.  Eyes: Pupils are equal, round, and reactive to light.  Neck: Normal range of motion.  Cardiovascular: Normal  rate and regular rhythm.   Respiratory: Effort normal.  GI: Soft.  Musculoskeletal:  Right knee still exhibits crepitation. Her motion is good at about 0-120. She has medial and patellofemoral pain. I do not feel an effusion.  Hip motion is full and pain free and SLR is negative on both sides.  There is no palpable LAD behind either knee.  Sensation and motor function are intact on both sides and there are palpable pulses on both sides.    Neurological: She is alert and oriented to person, place, and time.  Skin: Skin is warm and dry.  Psychiatric: She has a normal mood and affect. Her behavior is normal. Judgment and thought content normal.    Vital signs in last 24 hours:    Labs:   Estimated body mass index is 29.96 kg/m as calculated from the following:   Height as of 06/11/16: 5' 1.5" (1.562 m).   Weight as of 06/11/16: 73.1 kg (161 lb 3 oz).   Imaging Review Plain radiographs demonstrate severe degenerative joint disease of the right knee(s). The overall alignment isneutral. The bone quality appears to be good for age and reported activity level.  Assessment/Plan:  End stage primary arthritis, right knee   The patient history, physical examination, clinical judgment of the provider and imaging studies are consistent with end stage degenerative joint disease of the right knee(s) and total knee arthroplasty is deemed medically necessary. The treatment options including medical management, injection therapy arthroscopy and arthroplasty were discussed at length. The risks and benefits of total knee arthroplasty were presented and reviewed. The risks due to aseptic loosening, infection, stiffness, patella tracking problems, thromboembolic complications and other imponderables were discussed. The patient acknowledged the explanation, agreed to proceed with the plan and consent was signed. Patient is being admitted for inpatient treatment for surgery, pain control, PT, OT, prophylactic  antibiotics, VTE prophylaxis, progressive ambulation and ADL's and discharge planning. The patient is planning to be discharged home with home health services

## 2016-06-15 ENCOUNTER — Encounter (HOSPITAL_COMMUNITY)
Admission: RE | Disposition: A | Discharge status: Skilled Nursing Facility | Payer: Self-pay | Source: Ambulatory Visit | Attending: Orthopaedic Surgery

## 2016-06-15 ENCOUNTER — Inpatient Hospital Stay (HOSPITAL_COMMUNITY): Payer: Medicare Other | Admitting: Anesthesiology

## 2016-06-15 ENCOUNTER — Inpatient Hospital Stay (HOSPITAL_COMMUNITY)
Admission: RE | Admit: 2016-06-15 | Discharge: 2016-06-18 | DRG: 470 | Disposition: A | Discharge status: Skilled Nursing Facility | Payer: Medicare Other | Source: Ambulatory Visit | Attending: Orthopaedic Surgery | Admitting: Orthopaedic Surgery

## 2016-06-15 ENCOUNTER — Encounter (HOSPITAL_COMMUNITY): Payer: Self-pay | Admitting: *Deleted

## 2016-06-15 DIAGNOSIS — I10 Essential (primary) hypertension: Secondary | ICD-10-CM | POA: Diagnosis not present

## 2016-06-15 DIAGNOSIS — R531 Weakness: Secondary | ICD-10-CM | POA: Diagnosis not present

## 2016-06-15 DIAGNOSIS — J45909 Unspecified asthma, uncomplicated: Secondary | ICD-10-CM | POA: Diagnosis not present

## 2016-06-15 DIAGNOSIS — Z886 Allergy status to analgesic agent status: Secondary | ICD-10-CM

## 2016-06-15 DIAGNOSIS — Z96651 Presence of right artificial knee joint: Secondary | ICD-10-CM | POA: Diagnosis not present

## 2016-06-15 DIAGNOSIS — Z86010 Personal history of colonic polyps: Secondary | ICD-10-CM | POA: Diagnosis not present

## 2016-06-15 DIAGNOSIS — M8580 Other specified disorders of bone density and structure, unspecified site: Secondary | ICD-10-CM | POA: Diagnosis present

## 2016-06-15 DIAGNOSIS — Z880 Allergy status to penicillin: Secondary | ICD-10-CM | POA: Diagnosis not present

## 2016-06-15 DIAGNOSIS — Z471 Aftercare following joint replacement surgery: Secondary | ICD-10-CM | POA: Diagnosis not present

## 2016-06-15 DIAGNOSIS — M6281 Muscle weakness (generalized): Secondary | ICD-10-CM | POA: Diagnosis not present

## 2016-06-15 DIAGNOSIS — G8918 Other acute postprocedural pain: Secondary | ICD-10-CM | POA: Diagnosis not present

## 2016-06-15 DIAGNOSIS — M1711 Unilateral primary osteoarthritis, right knee: Secondary | ICD-10-CM | POA: Diagnosis not present

## 2016-06-15 DIAGNOSIS — J339 Nasal polyp, unspecified: Secondary | ICD-10-CM | POA: Diagnosis not present

## 2016-06-15 DIAGNOSIS — F329 Major depressive disorder, single episode, unspecified: Secondary | ICD-10-CM | POA: Diagnosis present

## 2016-06-15 DIAGNOSIS — Z881 Allergy status to other antibiotic agents status: Secondary | ICD-10-CM | POA: Diagnosis not present

## 2016-06-15 DIAGNOSIS — M1990 Unspecified osteoarthritis, unspecified site: Secondary | ICD-10-CM | POA: Diagnosis not present

## 2016-06-15 DIAGNOSIS — R2689 Other abnormalities of gait and mobility: Secondary | ICD-10-CM | POA: Diagnosis not present

## 2016-06-15 DIAGNOSIS — K219 Gastro-esophageal reflux disease without esophagitis: Secondary | ICD-10-CM | POA: Diagnosis not present

## 2016-06-15 HISTORY — PX: TOTAL KNEE ARTHROPLASTY: SHX125

## 2016-06-15 SURGERY — ARTHROPLASTY, KNEE, TOTAL
Anesthesia: Spinal | Site: Knee | Laterality: Right

## 2016-06-15 MED ORDER — LIDOCAINE HCL (CARDIAC) 20 MG/ML IV SOLN
INTRAVENOUS | Status: DC | PRN
  Administered 2016-06-15: 50 mg via INTRATRACHEAL

## 2016-06-15 MED ORDER — FENTANYL CITRATE (PF) 100 MCG/2ML IJ SOLN
INTRAMUSCULAR | Status: DC | PRN
  Administered 2016-06-15 (×2): 50 ug via INTRAVENOUS

## 2016-06-15 MED ORDER — FENTANYL CITRATE (PF) 100 MCG/2ML IJ SOLN
INTRAMUSCULAR | Status: AC
  Filled 2016-06-15: qty 2

## 2016-06-15 MED ORDER — METOCLOPRAMIDE HCL 5 MG PO TABS: 5.0000 mg | ORAL_TABLET | Freq: Three times a day (TID) | ORAL | Status: DC | PRN

## 2016-06-15 MED ORDER — HYDROMORPHONE HCL 1 MG/ML IJ SOLN
INTRAMUSCULAR | Status: AC
  Filled 2016-06-15: qty 1

## 2016-06-15 MED ORDER — METOCLOPRAMIDE HCL 5 MG/ML IJ SOLN: 5.0000 mg | Freq: Three times a day (TID) | INTRAMUSCULAR | Status: DC | PRN

## 2016-06-15 MED ORDER — SODIUM CHLORIDE 0.9 % IR SOLN
Status: DC | PRN
  Administered 2016-06-15: 3000 mL

## 2016-06-15 MED ORDER — ALUM & MAG HYDROXIDE-SIMETH 200-200-20 MG/5ML PO SUSP: 30.0000 mL | ORAL | Status: DC | PRN

## 2016-06-15 MED ORDER — HYDROMORPHONE HCL 2 MG/ML IJ SOLN
0.5000 mg | INTRAMUSCULAR | Status: DC | PRN
  Administered 2016-06-15 – 2016-06-16 (×4): 1 mg via INTRAVENOUS
  Filled 2016-06-15 (×4): qty 1

## 2016-06-15 MED ORDER — PHENOL 1.4 % MT LIQD: 1.0000 | OROMUCOSAL | Status: DC | PRN

## 2016-06-15 MED ORDER — ROPIVACAINE HCL 7.5 MG/ML IJ SOLN
INTRAMUSCULAR | Status: DC | PRN
  Administered 2016-06-15: 20 mL via PERINEURAL

## 2016-06-15 MED ORDER — LACTATED RINGERS IV SOLN
INTRAVENOUS | Status: DC
  Administered 2016-06-15 – 2016-06-17 (×3): via INTRAVENOUS

## 2016-06-15 MED ORDER — PROPOFOL 10 MG/ML IV BOLUS
INTRAVENOUS | Status: DC | PRN
Start: 2016-06-15 — End: 2016-06-15
  Administered 2016-06-15: 20 mg via INTRAVENOUS

## 2016-06-15 MED ORDER — METHOCARBAMOL 1000 MG/10ML IJ SOLN: 500.0000 mg | Freq: Four times a day (QID) | INTRAVENOUS | Status: DC | PRN

## 2016-06-15 MED ORDER — PHENYLEPHRINE HCL 10 MG/ML IJ SOLN
INTRAMUSCULAR | Status: DC | PRN
  Administered 2016-06-15: 25 ug/min via INTRAVENOUS

## 2016-06-15 MED ORDER — METHOCARBAMOL 500 MG PO TABS
500.0000 mg | ORAL_TABLET | Freq: Four times a day (QID) | ORAL | Status: DC | PRN
  Administered 2016-06-15 – 2016-06-16 (×2): 500 mg via ORAL
  Filled 2016-06-15 (×2): qty 1

## 2016-06-15 MED ORDER — BUPIVACAINE LIPOSOME 1.3 % IJ SUSP
20.0000 mL | INTRAMUSCULAR | Status: AC
  Administered 2016-06-15: 20 mL
  Filled 2016-06-15: qty 20

## 2016-06-15 MED ORDER — DIPHENHYDRAMINE HCL 12.5 MG/5ML PO ELIX: 12.5000 mg | ORAL_SOLUTION | ORAL | Status: DC | PRN

## 2016-06-15 MED ORDER — HYDROCODONE-ACETAMINOPHEN 5-325 MG PO TABS
1.0000 | ORAL_TABLET | ORAL | Status: DC | PRN
  Administered 2016-06-15 (×2): 2 via ORAL
  Administered 2016-06-15: 1 via ORAL
  Administered 2016-06-16: 2 via ORAL
  Filled 2016-06-15 (×3): qty 2

## 2016-06-15 MED ORDER — DOCUSATE SODIUM 100 MG PO CAPS
100.0000 mg | ORAL_CAPSULE | Freq: Two times a day (BID) | ORAL | Status: DC
  Administered 2016-06-15 – 2016-06-18 (×6): 100 mg via ORAL
  Filled 2016-06-15 (×6): qty 1

## 2016-06-15 MED ORDER — HYDROCODONE-ACETAMINOPHEN 5-325 MG PO TABS
ORAL_TABLET | ORAL | Status: AC
  Filled 2016-06-15: qty 2

## 2016-06-15 MED ORDER — APIXABAN 2.5 MG PO TABS
2.5000 mg | ORAL_TABLET | Freq: Two times a day (BID) | ORAL | Status: DC
  Administered 2016-06-16 – 2016-06-18 (×5): 2.5 mg via ORAL
  Filled 2016-06-15 (×5): qty 1

## 2016-06-15 MED ORDER — ONDANSETRON HCL 4 MG/2ML IJ SOLN: 4.0000 mg | Freq: Four times a day (QID) | INTRAMUSCULAR | Status: DC | PRN

## 2016-06-15 MED ORDER — TRANEXAMIC ACID 1000 MG/10ML IV SOLN
2000.0000 mg | INTRAVENOUS | Status: AC
  Administered 2016-06-15: 2000 mg via TOPICAL
  Filled 2016-06-15: qty 20

## 2016-06-15 MED ORDER — TRANEXAMIC ACID 1000 MG/10ML IV SOLN
1000.0000 mg | INTRAVENOUS | Status: AC
  Administered 2016-06-15: 1000 mg via INTRAVENOUS
  Filled 2016-06-15: qty 10

## 2016-06-15 MED ORDER — HYDROMORPHONE HCL 1 MG/ML IJ SOLN
0.2500 mg | INTRAMUSCULAR | Status: DC | PRN
  Administered 2016-06-15 (×2): 0.5 mg via INTRAVENOUS

## 2016-06-15 MED ORDER — PROMETHAZINE HCL 25 MG/ML IJ SOLN: 6.2500 mg | INTRAMUSCULAR | Status: DC | PRN

## 2016-06-15 MED ORDER — TRANEXAMIC ACID 1000 MG/10ML IV SOLN
1000.0000 mg | Freq: Once | INTRAVENOUS | Status: AC
  Administered 2016-06-15: 1000 mg via INTRAVENOUS
  Filled 2016-06-15: qty 10

## 2016-06-15 MED ORDER — BUPIVACAINE HCL (PF) 0.5 % IJ SOLN
INTRAMUSCULAR | Status: AC
  Filled 2016-06-15: qty 30

## 2016-06-15 MED ORDER — BUPIVACAINE-EPINEPHRINE (PF) 0.25% -1:200000 IJ SOLN
INTRAMUSCULAR | Status: DC | PRN
  Administered 2016-06-15: 20 mL

## 2016-06-15 MED ORDER — ALBUTEROL SULFATE HFA 108 (90 BASE) MCG/ACT IN AERS: 2.0000 | INHALATION_SPRAY | Freq: Four times a day (QID) | RESPIRATORY_TRACT | Status: DC | PRN

## 2016-06-15 MED ORDER — PHENYLEPHRINE 40 MCG/ML (10ML) SYRINGE FOR IV PUSH (FOR BLOOD PRESSURE SUPPORT)
PREFILLED_SYRINGE | INTRAVENOUS | Status: AC
  Filled 2016-06-15: qty 10

## 2016-06-15 MED ORDER — PHENYLEPHRINE HCL 10 MG/ML IJ SOLN
INTRAMUSCULAR | Status: DC | PRN
  Administered 2016-06-15: 40 ug via INTRAVENOUS
  Administered 2016-06-15: 80 ug via INTRAVENOUS

## 2016-06-15 MED ORDER — 0.9 % SODIUM CHLORIDE (POUR BTL) OPTIME
TOPICAL | Status: DC | PRN
  Administered 2016-06-15: 1000 mL

## 2016-06-15 MED ORDER — BUPIVACAINE-EPINEPHRINE (PF) 0.25% -1:200000 IJ SOLN
INTRAMUSCULAR | Status: AC
  Filled 2016-06-15: qty 30

## 2016-06-15 MED ORDER — SODIUM CHLORIDE 0.9 % IJ SOLN
INTRAMUSCULAR | Status: DC | PRN
  Administered 2016-06-15: 40 mL

## 2016-06-15 MED ORDER — ACETAMINOPHEN 650 MG RE SUPP: 650.0000 mg | Freq: Four times a day (QID) | RECTAL | Status: DC | PRN

## 2016-06-15 MED ORDER — ALBUTEROL SULFATE (2.5 MG/3ML) 0.083% IN NEBU: 2.5000 mg | INHALATION_SOLUTION | Freq: Four times a day (QID) | RESPIRATORY_TRACT | Status: DC | PRN

## 2016-06-15 MED ORDER — PROPOFOL 500 MG/50ML IV EMUL
INTRAVENOUS | Status: DC | PRN
  Administered 2016-06-15: 100 ug/kg/min via INTRAVENOUS
  Administered 2016-06-15: 75 ug/kg/min via INTRAVENOUS

## 2016-06-15 MED ORDER — BISACODYL 5 MG PO TBEC
5.0000 mg | DELAYED_RELEASE_TABLET | Freq: Every day | ORAL | Status: DC | PRN
  Administered 2016-06-18: 5 mg via ORAL
  Filled 2016-06-15: qty 1

## 2016-06-15 MED ORDER — MENTHOL 3 MG MT LOZG: 1.0000 | LOZENGE | OROMUCOSAL | Status: DC | PRN

## 2016-06-15 MED ORDER — VANCOMYCIN HCL IN DEXTROSE 1-5 GM/200ML-% IV SOLN
1000.0000 mg | Freq: Two times a day (BID) | INTRAVENOUS | Status: AC
  Administered 2016-06-15: 1000 mg via INTRAVENOUS
  Filled 2016-06-15 (×2): qty 200

## 2016-06-15 MED ORDER — LIDOCAINE 2% (20 MG/ML) 5 ML SYRINGE
INTRAMUSCULAR | Status: AC
  Filled 2016-06-15: qty 5

## 2016-06-15 MED ORDER — ACETAMINOPHEN 325 MG PO TABS
650.0000 mg | ORAL_TABLET | Freq: Four times a day (QID) | ORAL | Status: DC | PRN
  Administered 2016-06-17 – 2016-06-18 (×3): 650 mg via ORAL
  Filled 2016-06-15 (×3): qty 2

## 2016-06-15 MED ORDER — ONDANSETRON HCL 4 MG PO TABS: 4.0000 mg | ORAL_TABLET | Freq: Four times a day (QID) | ORAL | Status: DC | PRN

## 2016-06-15 SURGICAL SUPPLY — 64 items
BAG DECANTER FOR FLEXI CONT (MISCELLANEOUS) ×1 IMPLANT
BANDAGE ACE 4X5 VEL STRL LF (GAUZE/BANDAGES/DRESSINGS) ×2 IMPLANT
BANDAGE ACE 6X5 VEL STRL LF (GAUZE/BANDAGES/DRESSINGS) ×1 IMPLANT
BANDAGE ESMARK 6X9 LF (GAUZE/BANDAGES/DRESSINGS) ×1 IMPLANT
BLADE SAGITTAL 25.0X1.19X90 (BLADE) ×1 IMPLANT
BLADE SAW SGTL 13.0X1.19X90.0M (BLADE) ×1 IMPLANT
BNDG CMPR 9X6 STRL LF SNTH (GAUZE/BANDAGES/DRESSINGS) ×1
BNDG CMPR MED 10X6 ELC LF (GAUZE/BANDAGES/DRESSINGS) ×1
BNDG ELASTIC 6X10 VLCR STRL LF (GAUZE/BANDAGES/DRESSINGS) ×2 IMPLANT
BNDG ESMARK 6X9 LF (GAUZE/BANDAGES/DRESSINGS) ×2
BNDG GAUZE ELAST 4 BULKY (GAUZE/BANDAGES/DRESSINGS) ×4 IMPLANT
BOWL SMART MIX CTS (DISPOSABLE) ×2 IMPLANT
CAPT KNEE TOTAL 3 ATTUNE ×1 IMPLANT
CEMENT HV SMART SET (Cement) ×4 IMPLANT
CLSR STERI-STRIP ANTIMIC 1/2X4 (GAUZE/BANDAGES/DRESSINGS) ×1 IMPLANT
COVER SURGICAL LIGHT HANDLE (MISCELLANEOUS) ×2 IMPLANT
CUFF TOURNIQUET SINGLE 34IN LL (TOURNIQUET CUFF) ×2 IMPLANT
CUFF TOURNIQUET SINGLE 44IN (TOURNIQUET CUFF) IMPLANT
DECANTER SPIKE VIAL GLASS SM (MISCELLANEOUS) ×2 IMPLANT
DRAPE EXTREMITY T 121X128X90 (DRAPE) ×2 IMPLANT
DRAPE PROXIMA HALF (DRAPES) ×2 IMPLANT
DRAPE U-SHAPE 47X51 STRL (DRAPES) ×2 IMPLANT
DRSG ADAPTIC 3X8 NADH LF (GAUZE/BANDAGES/DRESSINGS) ×2 IMPLANT
DRSG PAD ABDOMINAL 8X10 ST (GAUZE/BANDAGES/DRESSINGS) ×2 IMPLANT
DURAPREP 26ML APPLICATOR (WOUND CARE) ×3 IMPLANT
ELECT REM PT RETURN 9FT ADLT (ELECTROSURGICAL) ×2
ELECTRODE REM PT RTRN 9FT ADLT (ELECTROSURGICAL) ×1 IMPLANT
FACESHIELD WRAPAROUND (MASK) ×2 IMPLANT
FACESHIELD WRAPAROUND OR TEAM (MASK) IMPLANT
GAUZE SPONGE 4X4 12PLY STRL (GAUZE/BANDAGES/DRESSINGS) ×2 IMPLANT
GLOVE BIO SURGEON STRL SZ8 (GLOVE) ×6 IMPLANT
GLOVE BIOGEL PI IND STRL 7.0 (GLOVE) IMPLANT
GLOVE BIOGEL PI IND STRL 8 (GLOVE) ×2 IMPLANT
GLOVE BIOGEL PI INDICATOR 7.0 (GLOVE) ×2
GLOVE BIOGEL PI INDICATOR 8 (GLOVE) ×2
GOWN STRL REUS W/ TWL LRG LVL3 (GOWN DISPOSABLE) ×1 IMPLANT
GOWN STRL REUS W/ TWL XL LVL3 (GOWN DISPOSABLE) ×2 IMPLANT
GOWN STRL REUS W/TWL LRG LVL3 (GOWN DISPOSABLE) ×4
GOWN STRL REUS W/TWL XL LVL3 (GOWN DISPOSABLE) ×4
HANDPIECE INTERPULSE COAX TIP (DISPOSABLE) ×2
HOOD PEEL AWAY FACE SHEILD DIS (HOOD) ×4 IMPLANT
IMMOBILIZER KNEE 22 UNIV (SOFTGOODS) ×2 IMPLANT
KIT BASIN OR (CUSTOM PROCEDURE TRAY) ×2 IMPLANT
KIT ROOM TURNOVER OR (KITS) ×2 IMPLANT
MANIFOLD NEPTUNE II (INSTRUMENTS) ×2 IMPLANT
NDL HYPO 21X1 ECLIPSE (NEEDLE) ×1 IMPLANT
NEEDLE HYPO 21X1 ECLIPSE (NEEDLE) ×2 IMPLANT
NS IRRIG 1000ML POUR BTL (IV SOLUTION) ×2 IMPLANT
PACK TOTAL JOINT (CUSTOM PROCEDURE TRAY) ×2 IMPLANT
PAD ABD 8X10 STRL (GAUZE/BANDAGES/DRESSINGS) ×1 IMPLANT
PAD ARMBOARD 7.5X6 YLW CONV (MISCELLANEOUS) ×6 IMPLANT
SET HNDPC FAN SPRY TIP SCT (DISPOSABLE) ×1 IMPLANT
SPONGE GAUZE 4X4 12PLY STER LF (GAUZE/BANDAGES/DRESSINGS) ×1 IMPLANT
STRIP CLOSURE SKIN 1/2X4 (GAUZE/BANDAGES/DRESSINGS) ×2 IMPLANT
SUT MNCRL AB 3-0 PS2 18 (SUTURE) ×2 IMPLANT
SUT VIC AB 0 CT1 27 (SUTURE) ×4
SUT VIC AB 0 CT1 27XBRD ANBCTR (SUTURE) ×2 IMPLANT
SUT VIC AB 2-0 CT1 27 (SUTURE) ×4
SUT VIC AB 2-0 CT1 TAPERPNT 27 (SUTURE) ×2 IMPLANT
SUT VLOC 180 0 24IN GS25 (SUTURE) ×2 IMPLANT
SYR 50ML LL SCALE MARK (SYRINGE) ×2 IMPLANT
TOWEL OR 17X24 6PK STRL BLUE (TOWEL DISPOSABLE) ×2 IMPLANT
TOWEL OR 17X26 10 PK STRL BLUE (TOWEL DISPOSABLE) ×2 IMPLANT
TRAY CATH 16FR W/PLASTIC CATH (SET/KITS/TRAYS/PACK) IMPLANT

## 2016-06-15 NOTE — Evaluation (Signed)
Physical Therapy Evaluation Patient Details Name: Aimee Lawrence MRN: [id number] DOB: [birth year] Today's Date: 06/15/2016   History of Present Illness  Pt admitted for elective R TKA.  Clinical Impression  Pt is s/p TKA resulting in the deficits listed below (see PT Problem List). Pt tolerated OOB mobility well for first time. Pt will benefit from skilled PT to increase their independence and safety with mobility to allow discharge to the venue listed below.      Follow Up Recommendations SNF;Supervision/Assistance - 24 hour    Equipment Recommendations  Rolling walker with 5" wheels;3in1 (PT)    Recommendations for Other Services       Precautions / Restrictions Precautions Precautions: Knee Precaution Booklet Issued: Yes (comment) Precaution Comments: pt with verbal confirmation Required Braces or Orthoses: Knee Immobilizer - Right Knee Immobilizer - Right: Discontinue once straight leg raise with < 10 degree lag Restrictions Weight Bearing Restrictions: Yes RLE Weight Bearing: Weight bearing as tolerated      Mobility  Bed Mobility Overal bed mobility: Needs Assistance Bed Mobility: Supine to Sit     Supine to sit: Min assist     General bed mobility comments: directional v/c's, HOB elevated  Transfers Overall transfer level: Needs assistance Equipment used: Rolling walker (2 wheeled) Transfers: Sit to/from [facility] Sit to Stand: Min assist Stand pivot transfers: Min assist       General transfer comment: max directional v/c's for hand placement and sequencing of stepping, pt with minimal R LE WBing, increased bilat UE WBing  Ambulation/Gait             General Gait Details: limited to std pvt to chair  Stairs            Wheelchair Mobility    Modified Rankin (Stroke Patients Only)       Balance Overall balance assessment: No apparent balance deficits (not formally assessed) (needs RW for standing due to recent R TKA)                                            Pertinent Vitals/Pain Pain Assessment: 0-10 Pain Score: 4  Pain Location: surgical site, R knee Pain Descriptors / Indicators: Sore Pain Intervention(s): Premedicated before session    Home Living Family/patient expects to be discharged to:: Skilled nursing facility Living Arrangements: Alone               Additional Comments: pt reports she desires to go to [facility]    Prior Function Level of Independence: Independent               Hand Dominance   Dominant Hand: Right    Extremity/Trunk Assessment   Upper Extremity Assessment: Overall WFL for tasks assessed           Lower Extremity Assessment: RLE deficits/detail RLE Deficits / Details: trace initiation of quad set,  tolerated AA knee flexion to 60 deg    Cervical / Trunk Assessment: Normal  Communication   Communication: HOH  Cognition Arousal/Alertness: Awake/alert Behavior During Therapy: WFL for tasks assessed/performed Overall Cognitive Status: Within Functional Limits for tasks assessed                      General Comments General comments (skin integrity, edema, etc.): R LE with ace wrap    Exercises     Assessment/Plan  PT Assessment Patient needs continued PT services  PT Problem List Decreased strength;Decreased range of motion;Decreased activity tolerance;Decreased balance;Decreased mobility;Pain          PT Treatment Interventions DME instruction;Gait training;Stair training;Functional mobility training;Therapeutic activities;Therapeutic exercise    PT Goals (Current goals can be found in the Care Plan section)  Acute Rehab PT Goals Patient Stated Goal: rehab then home PT Goal Formulation: With patient Time For Goal Achievement: 06/22/16 Potential to Achieve Goals: Good    Frequency 7X/week   Barriers to discharge Decreased caregiver support lives alone    Co-evaluation                End of Session Equipment Utilized During Treatment: Gait belt;Left knee immobilizer Activity Tolerance: Patient tolerated treatment well Patient left: in chair;with call bell/phone within reach;with family/visitor present Nurse Communication: Mobility status         Time: [id number] PT Time Calculation (min) (ACUTE ONLY): 22 min   Charges:   PT Evaluation $PT Eval Moderate Complexity: 1 Procedure     PT G Codes:        Lynard Postlewait M Adalei Novell 06/15/2016, 4:19 PM   Kittie Plater, PT, DPT Pager #: [phone] Office #: [phone]

## 2016-06-15 NOTE — Anesthesia Procedure Notes (Signed)
Anesthesia Regional Block:  Adductor canal block  Pre-Anesthetic Checklist: ,, timeout performed, Correct Patient, Correct Site, Correct Laterality, Correct Procedure, Correct Position, site marked, Risks and benefits discussed,  Surgical consent,  Pre-op evaluation,  At surgeon's request and post-op pain management  Laterality: Right  Prep: chloraprep       Needles:  Injection technique: Single-shot  Needle Type: Echogenic Needle     Needle Length: 9cm 9 cm Needle Gauge: 21 and 21 G    Additional Needles:  Procedures: ultrasound guided (picture in chart) Adductor canal block Narrative:  Start time: 06/15/2016 7:20 AM End time: 06/15/2016 7:26 AM Injection made incrementally with aspirations every 5 mL.  Performed by: Personally  Anesthesiologist: Suzette Battiest

## 2016-06-15 NOTE — Progress Notes (Signed)
Orthopedic Tech Progress Note Patient Details:  Aimee Lawrence [birth year] [id number]  CPM Right Knee CPM Right Knee: On Right Knee Flexion (Degrees): 90 Right Knee Extension (Degrees): 0 Additional Comments: trapeze bar patient helper   [name] 06/15/2016, 10:28 AM Viewed order from doctor's order list

## 2016-06-15 NOTE — Anesthesia Postprocedure Evaluation (Signed)
Anesthesia Post Note  Patient: Aimee Lawrence  Procedure(s) Performed: Procedure(s) (LRB): RIGHT TOTAL KNEE ARTHROPLASTY (Right)  Patient location during evaluation: PACU Anesthesia Type: Spinal, MAC and Regional Level of consciousness: awake and alert Pain management: pain level controlled Vital Signs Assessment: post-procedure vital signs reviewed and stable Respiratory status: spontaneous breathing and respiratory function stable Cardiovascular status: blood pressure returned to baseline and stable Postop Assessment: spinal receding Anesthetic complications: no    Last Vitals:  Vitals:   06/15/16 1145 06/15/16 1212  BP:    Pulse: 63 60  Resp: (!) 21 19  Temp:      Last Pain:  Vitals:   06/15/16 1212  TempSrc:   PainSc: 4                  Tiajuana Amass

## 2016-06-15 NOTE — Anesthesia Procedure Notes (Signed)
Spinal  Staffing Anesthesiologist: Jaquelin Meaney Performed: anesthesiologist  Preanesthetic Checklist Completed: patient identified, site marked, surgical consent, pre-op evaluation, timeout performed, IV checked, risks and benefits discussed and monitors and equipment checked Spinal Block Patient position: sitting Prep: site prepped and draped and DuraPrep Patient monitoring: heart rate, continuous pulse ox and blood pressure Approach: midline Location: L4-5 Injection technique: single-shot Needle Needle type: Pencan  Needle gauge: 24 G Needle length: 9 cm     

## 2016-06-15 NOTE — Transfer of Care (Signed)
Immediate Anesthesia Transfer of Care Note  Patient: Aimee Lawrence  Procedure(s) Performed: Procedure(s): RIGHT TOTAL KNEE ARTHROPLASTY (Right)  Patient Location: PACU  Anesthesia Type:Spinal and MAC combined with regional for post-op pain  Level of Consciousness: alert , oriented and patient cooperative  Airway & Oxygen Therapy: Patient Spontanous Breathing and Patient connected to nasal cannula oxygen  Post-op Assessment: Report given to RN and Post -op Vital signs reviewed and stable  Post vital signs: Reviewed and stable  Last Vitals:  Vitals:   06/15/16 0556  BP: 128/63  Pulse: 76  Resp: 20  Temp: 36.7 C    Last Pain:  Vitals:   06/15/16 0556  TempSrc: Oral         Complications: No apparent anesthesia complications

## 2016-06-15 NOTE — Discharge Instructions (Signed)

## 2016-06-15 NOTE — Op Note (Signed)
PREOP DIAGNOSIS: DJD RIGHT KNEE POSTOP DIAGNOSIS: same PROCEDURE: RIGHT TKR ANESTHESIA: Spinal and block and MAC ATTENDING SURGEON: Aimee Lawrence G ASSISTANT: Aimee Dolly PA  INDICATIONS FOR PROCEDURE: Aimee Lawrence is a 70 y.o. female who has struggled for a long time with pain due to degenerative arthritis of the right knee.  The patient has failed many conservative non-operative measures and at this point has pain which limits the ability to sleep and walk.  The patient is offered total knee replacement.  Informed operative consent was obtained after discussion of possible risks of anesthesia, infection, neurovascular injury, DVT, and death.  The importance of the post-operative rehabilitation protocol to optimize result was stressed extensively with the patient.  SUMMARY OF FINDINGS AND PROCEDURE:  Aimee Lawrence was taken to the operative suite where under the above anesthesia a right knee replacement was performed.  There were advanced degenerative changes and the bone quality was good.  We used the DePuy Attune system and placed size 5 femur, 5 tibia, 35 mm all polyethylene patella, and a size 5 mm spacer.  Aimee Dolly PA-C assisted throughout and was invaluable to the completion of the case in that he helped retract and maintain exposure while I placed components.  He also helped close thereby minimizing OR time.  The patient was admitted for appropriate post-op care to include perioperative antibiotics and mechanical and pharmacologic measures for DVT prophylaxis.  DESCRIPTION OF PROCEDURE:  Aimee Lawrence was taken to the operative suite where the above anesthesia was applied.  The patient was positioned supine and prepped and draped in normal sterile fashion.  An appropriate time out was performed.  After the administration of vancomycin pre-op antibiotic the leg was elevated and exsanguinated and a tourniquet inflated. A standard longitudinal incision was made on the anterior knee.  Dissection was  carried down to the extensor mechanism.  All appropriate anti-infective measures were used including the pre-operative antibiotic, betadine impregnated drape, and closed hooded exhaust systems for each member of the surgical team.  A medial parapatellar incision was made in the extensor mechanism and the knee cap flipped and the knee flexed.  Some residual meniscal tissues were removed along with any remaining ACL/PCL tissue.  A guide was placed on the tibia and a flat cut was made on it's superior surface.  An intramedullary guide was placed in the femur and was utilized to make anterior and posterior cuts creating an appropriate flexion gap.  A second intramedullary guide was placed in the femur to make a distal cut properly balancing the knee with an extension gap equal to the flexion gap.  The three bones sized to the above mentioned sizes and the appropriate guides were placed and utilized.  A trial reduction was done and the knee easily came to full extension and the patella tracked well on flexion.  The trial components were removed and all bones were cleaned with pulsatile lavage and then dried thoroughly.  Cement was mixed and was pressurized onto the bones followed by placement of the aforementioned components.  Excess cement was trimmed and pressure was held on the components until the cement had hardened.  The tourniquet was deflated and a small amount of bleeding was controlled with cautery and pressure.  The knee was irrigated thoroughly.  The extensor mechanism was re-approximated with V-loc suture in running fashion.  The knee was flexed and the repair was solid.  The subcutaneous tissues were re-approximated with #0 and #2-0 vicryl and the skin closed  with a subcuticular stitch and steristrips.  A sterile dressing was applied.  Intraoperative fluids, EBL, and tourniquet time can be obtained from anesthesia records.  DISPOSITION:  The patient was taken to recovery room in stable condition and  admitted for appropriate post-op care to include peri-operative antibiotic and DVT prophylaxis with mechanical and pharmacologic measures.  Aimee Lawrence G 06/15/2016, 9:46 AM

## 2016-06-16 ENCOUNTER — Encounter (HOSPITAL_COMMUNITY): Payer: Self-pay | Admitting: Orthopaedic Surgery

## 2016-06-16 MED ORDER — HYDROMORPHONE HCL 2 MG/ML IJ SOLN
0.5000 mg | INTRAMUSCULAR | Status: DC | PRN
  Administered 2016-06-16 – 2016-06-17 (×7): 1 mg via INTRAVENOUS
  Filled 2016-06-16 (×7): qty 1

## 2016-06-16 MED ORDER — OXYCODONE HCL 5 MG PO TABS
5.0000 mg | ORAL_TABLET | ORAL | Status: DC | PRN
  Administered 2016-06-16 – 2016-06-18 (×11): 10 mg via ORAL
  Filled 2016-06-16 (×11): qty 2

## 2016-06-16 MED ORDER — METHOCARBAMOL 750 MG PO TABS
750.0000 mg | ORAL_TABLET | Freq: Four times a day (QID) | ORAL | Status: DC | PRN
  Administered 2016-06-16 – 2016-06-18 (×7): 750 mg via ORAL
  Filled 2016-06-16 (×7): qty 1

## 2016-06-16 MED ORDER — METHOCARBAMOL 1000 MG/10ML IJ SOLN: 500.0000 mg | Freq: Four times a day (QID) | INTRAMUSCULAR | Status: DC | PRN

## 2016-06-16 NOTE — Evaluation (Signed)
Occupational Therapy Evaluation Patient Details Name: Aimee Lawrence MRN: [id number] DOB: 04-05-1946 Today's Date: 06/16/2016    History of Present Illness Pt admitted for elective R TKA.   Clinical Impression   Pt with decline in function and safety with ADLs and ADL mobility with decreased activity tolerance and balance. Pt would benefit from acute OT services to address impairments to increase level of function and safety. Pt planning to d/c to SNF for therapy    Follow Up Recommendations  SNF    Equipment Recommendations  Other (comment) (TBD at next venue of care)    Recommendations for Other Services       Precautions / Restrictions Precautions Precautions: Knee Precaution Comments: reviewed no pillpw under knee Required Braces or Orthoses: Knee Immobilizer - Right Knee Immobilizer - Right: Discontinue once straight leg raise with < 10 degree lag Restrictions Weight Bearing Restrictions: Yes RLE Weight Bearing: Weight bearing as tolerated      Mobility Bed Mobility               General bed mobility comments: pt up in recliner  Transfers Overall transfer level: Needs assistance Equipment used: Rolling walker (2 wheeled) Transfers: Sit to/from Stand Sit to Stand: Min assist         General transfer comment: cues for hand placement with stand > sit    Balance Overall balance assessment: Needs assistance   Sitting balance-Leahy Scale: Good       Standing balance-Leahy Scale: Fair                              ADL Overall ADL's : Needs assistance/impaired     Grooming: Wash/dry face;Wash/dry hands;Standing;Min guard   Upper Body Bathing: Set up   Lower Body Bathing: Moderate assistance   Upper Body Dressing : Set up   Lower Body Dressing: Moderate assistance   Toilet Transfer: Minimal assistance;Comfort height toilet;Grab bars;RW;Ambulation   Toileting- Clothing Manipulation and Hygiene: Minimal assistance        Functional mobility during ADLs: Minimal assistance       Vision Vision Assessment?: No apparent visual deficits              Pertinent Vitals/Pain Pain Assessment: 0-10 Pain Score: 4  Pain Location: R knee Pain Descriptors / Indicators: Aching;Operative site guarding;Guarding Pain Intervention(s): Limited activity within patient's tolerance;Monitored during session;Premedicated before session     Hand Dominance Right   Extremity/Trunk Assessment Upper Extremity Assessment Upper Extremity Assessment: Overall WFL for tasks assessed   Lower Extremity Assessment Lower Extremity Assessment: Defer to PT evaluation   Cervical / Trunk Assessment Cervical / Trunk Assessment: Normal   Communication Communication Communication: HOH   Cognition Arousal/Alertness: Awake/alert Behavior During Therapy: WFL for tasks assessed/performed Overall Cognitive Status: Within Functional Limits for tasks assessed                     General Comments   pt pleasant and cooperative                Home Living Family/patient expects to be discharged to:: Skilled nursing facility Living Arrangements: Alone                               Additional Comments: pt reports she desires to go to [facility]      Prior Functioning/Environment Level of Independence: Independent  OT Problem List: Decreased activity tolerance;Decreased knowledge of use of DME or AE;Pain;Decreased knowledge of precautions;Impaired balance (sitting and/or standing)   OT Treatment/Interventions: Self-care/ADL training;DME and/or AE instruction;Therapeutic activities;Patient/family education    OT Goals(Current goals can be found in the care plan section) Acute Rehab OT Goals Patient Stated Goal: rehab then home OT Goal Formulation: With patient/family Time For Goal Achievement: 06/23/16 Potential to Achieve Goals: Good ADL Goals Pt Will Perform Grooming: with  supervision;with set-up;standing Pt Will Perform Lower Body Bathing: with min assist Pt Will Perform Lower Body Dressing: with min assist Pt Will Transfer to Toilet: with min guard assist;with supervision;ambulating Pt Will Perform Toileting - Clothing Manipulation and hygiene: with min guard assist;sit to/from stand Pt Will Perform Tub/Shower Transfer: with min assist;with min guard assist;shower seat;3 in 1;rolling walker;ambulating  OT Frequency: Min 2X/week   Barriers to D/C: Decreased caregiver support                        End of Session Equipment Utilized During Treatment: Gait belt;Rolling walker;Other (comment) (3 in 1 ) CPM Right Knee CPM Right Knee: Off  Activity Tolerance: Patient tolerated treatment well Patient left: Other (comment) (ambulating with PT)   Time: [id number] OT Time Calculation (min): 23 min Charges:  OT General Charges $OT Visit: 1 Procedure OT Treatments $Therapeutic Activity: 8-22 mins G-Codes:    Britt Bottom 06/16/2016, 2:25 PM

## 2016-06-16 NOTE — Progress Notes (Signed)
Physical Therapy Treatment Patient Details Name: Aimee Lawrence MRN: [id number] DOB: [birth year] Today's Date: 06/16/2016    History of Present Illness Pt admitted for elective R TKA.    PT Comments    Pt able to ambulate in room with min/guard and cues for sequencing.  She lives alone and the person planning on helping her post op is no longer able to help. Recommend SNF.  Follow Up Recommendations  SNF;Supervision/Assistance - 24 hour     Equipment Recommendations  Rolling walker with 5" wheels;3in1 (PT)    Recommendations for Other Services       Precautions / Restrictions Precautions Precautions: Knee Precaution Comments: reviewed no pillpw under knee Required Braces or Orthoses: Knee Immobilizer - Right Knee Immobilizer - Right: Discontinue once straight leg raise with < 10 degree lag Restrictions Weight Bearing Restrictions: Yes RLE Weight Bearing: Weight bearing as tolerated    Mobility  Bed Mobility               General bed mobility comments: Pt up with OT upon arrival  Transfers Overall transfer level: Needs assistance Equipment used: Rolling walker (2 wheeled) Transfers: Sit to/from Stand           General transfer comment: cues for hand placement with stand > sit  Ambulation/Gait Ambulation/Gait assistance: Min guard Ambulation Distance (Feet): 30 Feet Assistive device: Rolling walker (2 wheeled) Gait Pattern/deviations: Decreased step length - left;Antalgic;Trunk flexed     General Gait Details: Cues for posture and proper sequencing   Stairs            Wheelchair Mobility    Modified Rankin (Stroke Patients Only)       Balance                                    Cognition Arousal/Alertness: Awake/alert Behavior During Therapy: WFL for tasks assessed/performed Overall Cognitive Status: Within Functional Limits for tasks assessed                      Exercises Total Joint Exercises Ankle  Circles/Pumps: AROM;Both Quad Sets: Strengthening;Both;10 reps;Supine Heel Slides: AAROM;Right;10 reps;Supine Hip ABduction/ADduction: AAROM;Right;10 reps;Supine    General Comments        Pertinent Vitals/Pain Pain Assessment: 0-10 Pain Score: 4  Pain Location: R knee Pain Descriptors / Indicators: Aching;Operative site guarding;Guarding Pain Intervention(s): Limited activity within patient's tolerance;Monitored during session;Premedicated before session    Home Living Family/patient expects to be discharged to:: Skilled nursing facility Living Arrangements: Alone             Additional Comments: pt reports she desires to go to [facility]    Prior Function Level of Independence: Independent          PT Goals (current goals can now be found in the care plan section) Acute Rehab PT Goals Patient Stated Goal: rehab then home PT Goal Formulation: With patient Time For Goal Achievement: 06/22/16 Potential to Achieve Goals: Good Progress towards PT goals: Progressing toward goals    Frequency    7X/week      PT Plan Current plan remains appropriate    Co-evaluation             End of Session Equipment Utilized During Treatment: Gait belt;Right knee immobilizer Activity Tolerance: Patient tolerated treatment well Patient left: in chair;with call bell/phone within reach;Other (comment) (with lunch)     Time: [id number]  PT Time Calculation (min) (ACUTE ONLY): 17 min  Charges:  $Gait Training: 8-22 mins                    G Codes:      Jalisia Puchalski LUBECK 06/16/2016, 2:20 PM

## 2016-06-16 NOTE — Progress Notes (Signed)
PT Cancellation Note  Patient Details Name: Aimee Lawrence MRN: [id number] DOB: [birth year]   Cancelled Treatment:    Reason Eval/Treat Not Completed: Patient declined. Pt and visitor report she had issues with pain all night and just got something that is helping and she needs to rest.  Pt did state that initially she was planning on going home after surgery, but the person who was going to assist her is now sick and unable to help.  She is requesting SNF rehab as she will not have any assistance.  Will check back later.   Emillie Chasen LUBECK 06/16/2016, 8:48 AM

## 2016-06-16 NOTE — Progress Notes (Signed)
Orthopedic Tech Progress Note Patient Details:  Aimee Lawrence [birth year] [id number] Put on cpm at 1820 Patient ID: [name], female   DOB: 1945/10/07, 70 y.o.   MRN: [id number]   Braulio Bosch 06/16/2016, 6:22 PM

## 2016-06-16 NOTE — Progress Notes (Signed)
Subjective: 1 Day Post-Op Procedure(s) (LRB): RIGHT TOTAL KNEE ARTHROPLASTY (Right)   Patient lying in bed in CPM. Her pain level is quite high and she is wanting a change in her pain meds.  Activity level:  ok Diet tolerance:  ok Voiding:  ok Patient reports pain as moderate.    Objective: Vital signs in last 24 hours: Temp:  [97.1 F (36.2 C)-99.8 F (37.7 C)] 99 F (37.2 C) (12/06 0420) Pulse Rate:  [54-85] 70 (12/06 0420) Resp:  [9-21] 16 (12/06 0420) BP: (102-119)/(49-65) 115/57 (12/06 0420) SpO2:  [96 %-100 %] 98 % (12/06 0420)  Labs: No results for input(s): HGB in the last 72 hours. No results for input(s): WBC, RBC, HCT, PLT in the last 72 hours. No results for input(s): NA, K, CL, CO2, BUN, CREATININE, GLUCOSE, CALCIUM in the last 72 hours. No results for input(s): LABPT, INR in the last 72 hours.  Physical Exam:  Neurologically intact ABD soft Neurovascular intact Sensation intact distally Intact pulses distally Dorsiflexion/Plantar flexion intact Incision: dressing C/D/I and no drainage No cellulitis present Compartment soft  Assessment/Plan:  1 Day Post-Op Procedure(s) (LRB): RIGHT TOTAL KNEE ARTHROPLASTY (Right) Advance diet Up with therapy Plan for discharge tomorrow Discharge home with home health if doing well and cleared by PT. Follow up in office 2 weeks post op. Continue on eliquis for DVT prevention. I have changed her to oxycodone and increased the frequency of dilaudid and increased the dose of robaxin. I will change dressing to aquacel tomorrow.  Geronimo Diliberto, Larwance Sachs 06/16/2016, 7:52 AM

## 2016-06-17 NOTE — Progress Notes (Signed)
Physical Therapy Treatment Patient Details Name: Aimee Lawrence MRN: [id number] DOB: 09/25/1945 Today's Date: 06/17/2016    History of Present Illness Pt admitted for elective R TKA.    PT Comments    Pt presents POD 3 and is moving well with therapy. Performed gait in room and a short distance into hallway. Pt assisted to bathroom upon returning to room to perform toileting and to brush her teeth at the sink with min guard for safety. Improved weighbearing through RLE noted this session with gait activities. Pt continues to benefit from SNF placement as she would be returning home alone without any assistance.    Follow Up Recommendations  SNF;Supervision/Assistance - 24 hour     Equipment Recommendations  Rolling walker with 5" wheels;3in1 (PT)    Recommendations for Other Services       Precautions / Restrictions Precautions Precautions: Knee Precaution Booklet Issued: Yes (comment) Precaution Comments: reviewed no pillow under knee Required Braces or Orthoses: Knee Immobilizer - Right Knee Immobilizer - Right: Discontinue once straight leg raise with < 10 degree lag Restrictions Weight Bearing Restrictions: Yes RLE Weight Bearing: Weight bearing as tolerated    Mobility  Bed Mobility Overal bed mobility: Needs Assistance Bed Mobility: Supine to Sit     Supine to sit: Min assist     General bed mobility comments: Min a to bring RLE EOB  Transfers Overall transfer level: Needs assistance Equipment used: Rolling walker (2 wheeled) Transfers: Sit to/from Stand Sit to Stand: Min assist         General transfer comment: cues for hand placement with stand > sit  Ambulation/Gait Ambulation/Gait assistance: Min guard Ambulation Distance (Feet): 50 Feet Assistive device: Rolling walker (2 wheeled) Gait Pattern/deviations: Decreased step length - left;Antalgic;Trunk flexed Gait velocity: decreased Gait velocity interpretation: Below normal speed for  age/gender General Gait Details: cues for heel strike on RLE to improve wbing   Stairs            Wheelchair Mobility    Modified Rankin (Stroke Patients Only)       Balance Overall balance assessment: Needs assistance Sitting-balance support: No upper extremity supported;Feet supported Sitting balance-Leahy Scale: Good Sitting balance - Comments: EOB with no back supporrt   Standing balance support: Bilateral upper extremity supported Standing balance-Leahy Scale: Poor Standing balance comment: relies on UEs with gait and leans on counter with standing activities                    Cognition Arousal/Alertness: Awake/alert Behavior During Therapy: WFL for tasks assessed/performed Overall Cognitive Status: Within Functional Limits for tasks assessed                      Exercises Total Joint Exercises Ankle Circles/Pumps: AROM;Both;20 reps;Supine Quad Sets: Strengthening;Both;10 reps;Supine Heel Slides: AAROM;Right;10 reps;Supine    General Comments        Pertinent Vitals/Pain Pain Assessment: 0-10 Pain Score: 6  Pain Location: R knee Pain Descriptors / Indicators: Aching;Operative site guarding;Guarding Pain Intervention(s): Monitored during session;Premedicated before session;Ice applied    Home Living                      Prior Function            PT Goals (current goals can now be found in the care plan section) Acute Rehab PT Goals Patient Stated Goal: rehab then home Progress towards PT goals: Progressing toward goals    Frequency  7X/week      PT Plan Current plan remains appropriate    Co-evaluation             End of Session Equipment Utilized During Treatment: Gait belt;Right knee immobilizer Activity Tolerance: Patient tolerated treatment well Patient left: in chair;with call bell/phone within reach     Time: [id number] PT Time Calculation (min) (ACUTE ONLY): 41 min  Charges:  $Gait Training:  8-22 mins $Therapeutic Activity: 8-22 mins                    G Codes:      Scheryl Marten PT, DPT  [phone]  06/17/2016, 12:05 PM

## 2016-06-17 NOTE — Progress Notes (Signed)
Physical Therapy Treatment Patient Details Name: Aimee Lawrence MRN: [id number] DOB: 03-24-1946 Today's Date: 06/17/2016    History of Present Illness Pt admitted for elective R TKA.    PT Comments    Pt presents unsettled this PM session as she was given the option to return home vs SNF. Pt lives alone, but may have a friend present who can assist her when she returns home. Pt to decide this evening prior to DC tomorrow. Performed indoor gait and mobility to bathroom with min a to CGA for safety. Pt continues to move well, but would require help once returning home.    Follow Up Recommendations  SNF;Home health PT;Supervision/Assistance - 24 hour     Equipment Recommendations  Rolling walker with 5" wheels;3in1 (PT)    Recommendations for Other Services       Precautions / Restrictions Precautions Precautions: Knee Precaution Booklet Issued: Yes (comment) Precaution Comments: reviewed no pillow under knee Required Braces or Orthoses: Knee Immobilizer - Right Knee Immobilizer - Right: Discontinue once straight leg raise with < 10 degree lag Restrictions Weight Bearing Restrictions: Yes RLE Weight Bearing: Weight bearing as tolerated    Mobility  Bed Mobility Overal bed mobility: Needs Assistance Bed Mobility: Sit to Supine       Sit to supine: Min assist   General bed mobility comments: Min a to bring RLE into bed  Transfers Overall transfer level: Needs assistance Equipment used: Rolling walker (2 wheeled) Transfers: Sit to/from Stand Sit to Stand: Min assist         General transfer comment: cues for hand placement with stand > sit  Ambulation/Gait Ambulation/Gait assistance: Min guard Ambulation Distance (Feet): 50 Feet Assistive device: Rolling walker (2 wheeled) Gait Pattern/deviations: Decreased step length - left;Antalgic;Trunk flexed Gait velocity: decreased Gait velocity interpretation: Below normal speed for age/gender General Gait Details: cues  for heel strike on RLE to improve wbing   Stairs            Wheelchair Mobility    Modified Rankin (Stroke Patients Only)       Balance                                    Cognition Arousal/Alertness: Awake/alert Behavior During Therapy: WFL for tasks assessed/performed Overall Cognitive Status: Within Functional Limits for tasks assessed                      Exercises      General Comments General comments (skin integrity, edema, etc.): Pt nervous regarding the possibilty of going home as she doesn't have consisten help      Pertinent Vitals/Pain Pain Assessment: 0-10 Pain Score: 5  Pain Location: R knee Pain Descriptors / Indicators: Aching;Operative site guarding;Guarding Pain Intervention(s): Monitored during session;Ice applied;Repositioned;Premedicated before session    Home Living                      Prior Function            PT Goals (current goals can now be found in the care plan section) Acute Rehab PT Goals Patient Stated Goal: rehab then home Progress towards PT goals: Progressing toward goals    Frequency    7X/week      PT Plan Current plan remains appropriate    Co-evaluation             End  of Session Equipment Utilized During Treatment: Gait belt;Right knee immobilizer Activity Tolerance: Patient tolerated treatment well Patient left: in bed;in CPM;with call bell/phone within reach     Time: [id number] PT Time Calculation (min) (ACUTE ONLY): 44 min  Charges:  $Gait Training: 23-37 mins $Therapeutic Activity: 8-22 mins                    G Codes:      Scheryl Marten PT, DPT  [phone]  06/17/2016, 4:49 PM

## 2016-06-17 NOTE — Clinical Social Work Placement (Signed)
   CLINICAL SOCIAL WORK PLACEMENT  NOTE  Date:  06/17/2016  Patient Details  Name: Aimee Lawrence MRN: [id number] Date of Birth: Jun 03, 1946  Clinical Social Work is seeking post-discharge placement for this patient at the [place] level of care (*CSW will initial, date and re-position this form in  chart as items are completed):      Patient/family provided with [place] Work Department's list of facilities offering this level of care within the geographic area requested by the patient (or if unable, by the patient's family).  Yes   Patient/family informed of their freedom to choose among providers that offer the needed level of care, that participate in Medicare, Medicaid or managed care program needed by the patient, have an available bed and are willing to accept the patient.      Patient/family informed of [facility]'s ownership interest in [facility] and [facility], as well as of the fact that they are under no obligation to receive care at these facilities.  PASRR submitted to EDS on       PASRR number received on       Existing PASRR number confirmed on 06/17/16     FL2 transmitted to all facilities in geographic area requested by pt/family on 06/17/16     FL2 transmitted to all facilities within larger geographic area on       Patient informed that his/her managed care company has contracts with or will negotiate with certain facilities, including the following:        Yes   Patient/family informed of bed offers received.  Patient chooses bed at [facility]     Physician recommends and patient chooses bed at      Patient to be transferred to [facility] on 06/17/16.  Patient to be transferred to facility by [facility]     Patient family notified on 06/17/16 of transfer.  Name of family member notified:  Patient     PHYSICIAN Please prepare priority discharge summary, including medications, Please prepare prescriptions, Please  sign FL2     Additional Comment:    _______________________________________________ Alla German, LCSW 06/17/2016, 10:22 AM

## 2016-06-17 NOTE — Consult Note (Signed)
[facility] CM Primary Care Navigator  06/17/2016  MORIYA MITCHELL 17-Apr-1946 [id number]  Met with patient at the bedside to identify possible discharge needs. Patient reports having increased worsening pain to right knee with activity limitations that had led to this admission/surgery.  Patient endorses Dr. Alysia Penna with [facility] [place]  as her primary care provider.    Patient shared using [facility] in [place]. to obtain medications without any problem.  Patient states she manages her own medications at home straight out of the containers ("don't have that many").   She lives alone, able to drive and independent with self care prior to this admission/ surgery.   Her friend [name]) who lives close by will be providing transportation to her doctors' appointments as stated.   Patient reports that friends [name], Inez Catalina or Hilda Blades) will serve as her primary caregivers when she gets home.   Discharge plan is skilled nursing facility [facility]) for short term rehabilitation before going home.  Patient voiced understanding to call primary care provider's office once she returns home, for a post discharge follow-up appointment within a week or sooner if needs arise. Patient letter provided as a reminder.  She denies any further needs or concerns at this time.  For additional questions please contact:  Edwena Felty A. Baylin Gamblin, BSN, RN-BC [facility] PRIMARY CARE Navigator Cell: [phone]

## 2016-06-17 NOTE — Progress Notes (Signed)
Subjective: 2 Days Post-Op Procedure(s) (LRB): RIGHT TOTAL KNEE ARTHROPLASTY (Right)   Patient resting comfortably in chair. Pain level is improving. She walked better with PT today.  Activity level:  wbat Diet tolerance:  ok Voiding:  ok Patient reports pain as mild and moderate.    Objective: Vital signs in last 24 hours: Temp:  [98.8 F (37.1 C)-99.6 F (37.6 C)] 98.8 F (37.1 C) (12/07 1434) Pulse Rate:  [81-97] 97 (12/07 1434) Resp:  [16-18] 16 (12/07 1434) BP: (139-153)/(59-69) 153/69 (12/07 1434) SpO2:  [94 %-98 %] 98 % (12/07 1434)  Labs: No results for input(s): HGB in the last 72 hours. No results for input(s): WBC, RBC, HCT, PLT in the last 72 hours. No results for input(s): NA, K, CL, CO2, BUN, CREATININE, GLUCOSE, CALCIUM in the last 72 hours. No results for input(s): LABPT, INR in the last 72 hours.  Physical Exam:  Neurologically intact ABD soft Neurovascular intact Sensation intact distally Intact pulses distally Dorsiflexion/Plantar flexion intact Incision: dressing C/D/I and no drainage No cellulitis present Compartment soft  Assessment/Plan:  2 Days Post-Op Procedure(s) (LRB): RIGHT TOTAL KNEE ARTHROPLASTY (Right) Advance diet Up with therapy Plan for discharge tomorrow she will decide between SNF and home with HHPT.  I changed dressing to aquacel today. Continue on ASA 325mg  BID 2 weeks post op Follow up in office 2 weeks post op.  Jisselle Poth, Larwance Sachs 06/17/2016, 4:12 PM

## 2016-06-17 NOTE — Clinical Social Work Note (Signed)
Clinical Social Work Assessment  Patient Details  Name: Aimee Lawrence MRN: [id number] Date of Birth: 12-27-1945  Date of referral:  06/17/16               Reason for consult:  Facility Placement                Permission sought to share information with:  Other Permission granted to share information::  Yes, Verbal Permission Granted  Name::     [name]  Agency::     Relationship::  Friend  Contact Information:  [phone]  Housing/Transportation Living arrangements for the past 2 months:  Single Family Home Source of Information:  Patient Patient Interpreter Needed:  None Criminal Activity/Legal Involvement Pertinent to Current Situation/Hospitalization:  No - Comment as needed Significant Relationships:  Friend Lives with:  Self Do you feel safe going back to the place where you live?  Yes Need for family participation in patient care:  Yes (Comment)  Care giving concerns: Pt friend, [name] at bedside at this time. Pt doesn't report any family contact however, is agreeable to [facility] communicating with friend.   Social Worker assessment / plan:  CSW spoke with pt at bedside. Pt's friend is at bedside as well. Pt lives home alone. Pt's friend reports she will assist in pt care after return home from rehab. Pt's friend has had two knee surgeries and is familiar with SNF process. Pt is agreeable to SNF at this time. Pt prefers [facility] and has spoke with Ivin Booty prior to admission/surgery. CSW will reach out to facility and facilitate discharge needs.  Employment status:  Retired Nurse, adult PT Recommendations:  [place] / Referral to community resources:  [place]  Patient/Family's Response to care:  Pt verbalized understanding of CSW role and appreciation of support. Pt denies any questions regarding pt care at this time.  Patient/Family's Understanding of and Emotional Response to Diagnosis, Current Treatment, and  Prognosis:  Pt understanding and realistic regarding physical limitations. Pt agreeable to SNF placement at this time. Pt denies any questions regarding treatment plan at this time.  Emotional Assessment Appearance:  Appears younger than stated age Attitude/Demeanor/Rapport:   (Patient was appropriate.) Affect (typically observed):  Accepting, Appropriate, Calm Orientation:  Oriented to Self, Oriented to Place, Oriented to  Time, Oriented to Situation Alcohol / Substance use:  Not Applicable Psych involvement (Current and /or in the community):  No (Comment)  Discharge Needs  Concerns to be addressed:  No discharge needs identified Readmission within the last 30 days:  No Current discharge risk:  Dependent with Mobility Barriers to Discharge:  Continued Medical Work up   [employer], LCSW 06/17/2016, 10:16 AM

## 2016-06-17 NOTE — NC FL2 (Signed)
[place] LEVEL OF CARE SCREENING TOOL     IDENTIFICATION  Patient Name: Aimee Lawrence Birthdate: 03-22-1946 Sex: female Admission Date (Current Location): 06/15/2016  [facility] and [place] Number:  Herbalist and Address:  [facility] [place], [place] [address], [place], [place] [address]      Provider Number: [id number]  Attending Physician Name and Address:  Melrose Nakayama, MD  Relative Name and Phone Number:       Current Level of Care: Hospital Recommended Level of Care: [place] Prior Approval Number:    Date Approved/Denied: 06/17/16 PASRR Number: [id number]  Discharge Plan: SNF    Current Diagnoses: Patient Active Problem List   Diagnosis Date Noted  . Primary localized osteoarthritis of right knee 06/15/2016  . Primary osteoarthritis of right knee 06/15/2016  . Nasal polyps 11/20/2012  . 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 Essential hypertension 05/28/2010  . DEPRESSION 02/28/2007  . ASTHMA 02/28/2007  . OSTEOPENIA 02/28/2007  . Personal hx colon polyps 02/28/2007    Orientation RESPIRATION BLADDER Height & Weight     Self, Time, Situation, Place  Normal Continent Weight: 161 lb (73 kg) Height:     BEHAVIORAL SYMPTOMS/MOOD NEUROLOGICAL BOWEL NUTRITION STATUS      Continent  (Please see discharge summary)  AMBULATORY STATUS COMMUNICATION OF NEEDS Skin   Limited Assist Verbally Surgical wounds (Closed incision right knee. Incision on nose.)                       Personal Care Assistance Level of Assistance  Bathing, Feeding, Dressing Bathing Assistance: Limited assistance Feeding assistance: Independent Dressing Assistance: Limited assistance     Functional Limitations Info  Sight, Hearing Sight Info: Adequate Hearing Info: Impaired Speech Info: Adequate    SPECIAL CARE FACTORS FREQUENCY  PT (By licensed PT), OT (By licensed OT)     PT Frequency: 7x week OT Frequency: 7x week            Contractures Contractures Info: Not present    Additional Factors Info  Code Status, Allergies Code Status Info: Full Allergies Info: Aleve Naproxen Sodium, Aspirin, Ibuprofen, Ampicillin, Erythromycin           Current Medications (06/17/2016):  This is the current hospital active medication list Current Facility-Administered Medications  Medication Dose Route Frequency Provider Last Rate Last Dose  . acetaminophen (TYLENOL) tablet 650 mg  650 mg Oral Q6H PRN Loni Dolly, PA-C   650 mg at 06/17/16 [id number]   Or  . acetaminophen (TYLENOL) suppository 650 mg  650 mg Rectal Q6H PRN Loni Dolly, PA-C      . albuterol (PROVENTIL) (2.5 MG/3ML) 0.083% nebulizer solution 2.5 mg  2.5 mg Nebulization Q6H PRN Melrose Nakayama, MD      . alum & mag hydroxide-simeth (MAALOX/MYLANTA) 200-200-20 MG/5ML suspension 30 mL  30 mL Oral Q4H PRN Loni Dolly, PA-C      . apixaban Arne [place]) tablet 2.5 mg  2.5 mg Oral Q12H Loni Dolly, PA-C   2.5 mg at 06/17/16 0817  . bisacodyl (DULCOLAX) EC tablet 5 mg  5 mg Oral Daily PRN Loni Dolly, PA-C      . diphenhydrAMINE (BENADRYL) 12.5 MG/5ML elixir 12.5-25 mg  12.5-25 mg Oral Q4H PRN Loni Dolly, PA-C      . docusate sodium (COLACE) capsule 100 mg  100 mg Oral BID Loni Dolly, PA-C   100 mg at 06/17/16 0817  . HYDROmorphone (DILAUDID) injection  0.5-1 mg  0.5-1 mg Intravenous Q2H PRN Loni Dolly, PA-C   1 mg at 06/17/16 [id number]  . lactated ringers infusion   Intravenous Continuous Loni Dolly, PA-C 50 mL/hr at 06/17/16 0016    . menthol-cetylpyridinium (CEPACOL) lozenge 3 mg  1 lozenge Oral PRN Loni Dolly, PA-C       Or  . phenol (CHLORASEPTIC) mouth spray 1 spray  1 spray Mouth/Throat PRN Loni Dolly, PA-C      . methocarbamol (ROBAXIN) tablet 750 mg  750 mg Oral Q6H PRN Loni Dolly, PA-C   750 mg at 06/17/16 0318   Or  . methocarbamol (ROBAXIN) 500 mg in dextrose 5 % 50 mL IVPB  500 mg Intravenous Q6H PRN Loni Dolly, PA-C      . metoCLOPramide (REGLAN)  tablet 5-10 mg  5-10 mg Oral Q8H PRN Loni Dolly, PA-C       Or  . metoCLOPramide (REGLAN) injection 5-10 mg  5-10 mg Intravenous Q8H PRN Loni Dolly, PA-C      . ondansetron [facility]) tablet 4 mg  4 mg Oral Q6H PRN Loni Dolly, PA-C       Or  . ondansetron [facility]) injection 4 mg  4 mg Intravenous Q6H PRN Loni Dolly, PA-C      . oxyCODONE (Oxy IR/ROXICODONE) immediate release tablet 5-10 mg  5-10 mg Oral Q4H PRN Loni Dolly, PA-C   10 mg at 06/17/16 [id number]     Discharge Medications: Please see discharge summary for a list of discharge medications.  Relevant Imaging Results:  Relevant Lab Results:   Additional Information SSN: [id number]  Alla German, LCSW

## 2016-06-17 NOTE — Clinical Social Work Note (Signed)
Pt has a bed at [facility] today and once medically cleared will go by [facility]. MD please provide d/c summary, d/c order, and signed prescriptions on chart when pt is medically cleared.  [address], [place]

## 2016-06-18 DIAGNOSIS — Z471 Aftercare following joint replacement surgery: Secondary | ICD-10-CM | POA: Diagnosis not present

## 2016-06-18 DIAGNOSIS — J4520 Mild intermittent asthma, uncomplicated: Secondary | ICD-10-CM | POA: Diagnosis not present

## 2016-06-18 DIAGNOSIS — M8580 Other specified disorders of bone density and structure, unspecified site: Secondary | ICD-10-CM | POA: Diagnosis not present

## 2016-06-18 DIAGNOSIS — R531 Weakness: Secondary | ICD-10-CM | POA: Diagnosis not present

## 2016-06-18 DIAGNOSIS — M1990 Unspecified osteoarthritis, unspecified site: Secondary | ICD-10-CM | POA: Diagnosis not present

## 2016-06-18 DIAGNOSIS — R2689 Other abnormalities of gait and mobility: Secondary | ICD-10-CM | POA: Diagnosis not present

## 2016-06-18 DIAGNOSIS — M6281 Muscle weakness (generalized): Secondary | ICD-10-CM | POA: Diagnosis not present

## 2016-06-18 DIAGNOSIS — D649 Anemia, unspecified: Secondary | ICD-10-CM | POA: Diagnosis not present

## 2016-06-18 DIAGNOSIS — Z79899 Other long term (current) drug therapy: Secondary | ICD-10-CM | POA: Diagnosis not present

## 2016-06-18 DIAGNOSIS — Z96651 Presence of right artificial knee joint: Secondary | ICD-10-CM | POA: Diagnosis not present

## 2016-06-18 DIAGNOSIS — J309 Allergic rhinitis, unspecified: Secondary | ICD-10-CM | POA: Diagnosis not present

## 2016-06-18 DIAGNOSIS — R2681 Unsteadiness on feet: Secondary | ICD-10-CM | POA: Diagnosis not present

## 2016-06-18 DIAGNOSIS — M1711 Unilateral primary osteoarthritis, right knee: Secondary | ICD-10-CM | POA: Diagnosis not present

## 2016-06-18 DIAGNOSIS — I10 Essential (primary) hypertension: Secondary | ICD-10-CM | POA: Diagnosis not present

## 2016-06-18 MED ORDER — PROMETHAZINE HCL 12.5 MG PO TABS: 12.5000 mg | ORAL_TABLET | Freq: Four times a day (QID) | ORAL | 0 refills | Status: DC | PRN

## 2016-06-18 MED ORDER — APIXABAN 2.5 MG PO TABS: 2.5000 mg | ORAL_TABLET | Freq: Two times a day (BID) | ORAL | 0 refills | Status: DC

## 2016-06-18 MED ORDER — METHOCARBAMOL 750 MG PO TABS: 750.0000 mg | ORAL_TABLET | Freq: Four times a day (QID) | ORAL | 0 refills | Status: DC | PRN

## 2016-06-18 MED ORDER — OXYCODONE-ACETAMINOPHEN 5-325 MG PO TABS: 1.0000 | ORAL_TABLET | ORAL | 0 refills | Status: DC | PRN

## 2016-06-18 NOTE — Care Management Important Message (Signed)
Important Message  Patient Details  Name: Aimee Lawrence MRN: [id number] Date of Birth: 1945-12-24   Medicare Important Message Given:  Yes    Orbie Pyo 06/18/2016, 12:31 PM

## 2016-06-18 NOTE — Care Management Note (Signed)
Case Management Note  Patient Details  Name: Aimee Lawrence MRN: [id number] Date of Birth: [birth year]  Subjective/Objective:   70 yr old female s/p right total knee arthroplasty.                 Action/Plan: Patient will go to [facility] for short term rehab, social worker has arranged.   Expected Discharge Date:    06/18/16              Expected Discharge Plan:  [place]  In-House Referral:  Clinical Social Work  Discharge planning Services  CM Consult  Post Acute Care Choice:  NA Choice offered to:  Patient  DME Arranged:  [facility] DME Agency:  [employer]  HH Arranged:    [place] Agency:  [facility]  Status of Service:  Completed, signed off  If discussed at [place] of Stay Meetings, dates discussed:    Additional Comments:  Ninfa Meeker, RN 06/18/2016, 11:29 AM

## 2016-06-18 NOTE — Clinical Social Work Placement (Signed)
   CLINICAL SOCIAL WORK PLACEMENT  NOTE  Date:  06/18/2016  Patient Details  Name: Aimee Lawrence MRN: [id number] Date of Birth: 08/17/1945  Clinical Social Work is seeking post-discharge placement for this patient at the [place] level of care (*CSW will initial, date and re-position this form in  chart as items are completed):      Patient/family provided with [place] Work Department's list of facilities offering this level of care within the geographic area requested by the patient (or if unable, by the patient's family).  Yes   Patient/family informed of their freedom to choose among providers that offer the needed level of care, that participate in Medicare, Medicaid or managed care program needed by the patient, have an available bed and are willing to accept the patient.      Patient/family informed of [facility]'s ownership interest in [facility] and [facility], as well as of the fact that they are under no obligation to receive care at these facilities.  PASRR submitted to EDS on       PASRR number received on       Existing PASRR number confirmed on 06/17/16     FL2 transmitted to all facilities in geographic area requested by pt/family on 06/17/16     FL2 transmitted to all facilities within larger geographic area on       Patient informed that his/her managed care company has contracts with or will negotiate with certain facilities, including the following:        Yes   Patient/family informed of bed offers received.  Patient chooses bed at [facility]     Physician recommends and patient chooses bed at      Patient to be transferred to [facility] on 06/18/16.  Patient to be transferred to facility by [facility]     Patient family notified on 06/18/16 of transfer.  Name of family member notified:  Patient     PHYSICIAN Please prepare priority discharge summary, including medications, Please prepare prescriptions, Please  sign FL2     Additional Comment:    _______________________________________________ Rigoberto Noel, LCSW 06/18/2016, 1:17 PM

## 2016-06-18 NOTE — Discharge Summary (Signed)
Patient ID: Aimee Lawrence MRN: [id number] DOB/AGE: [birth year] 70 y.o.  Admit date: 06/15/2016 Discharge date: 06/18/2016  Admission Diagnoses:  Principal Problem:   Primary localized osteoarthritis of right knee Active Problems:   Primary osteoarthritis of right knee   Discharge Diagnoses:  Same  Past Medical History:  Diagnosis Date  . Allergy    takes Doxycycline daily and uses Flonase daily  . Arthritis   . Asthma    Albuterol inhaler as needed.  [place] Kitchen Dyspnea    rarely and sitting  . GERD (gastroesophageal reflux disease)    not on any meds  . History of bronchitis   . History of shingles   . Hx of colonic polyps    benign  . Joint pain   . Joint swelling   . Osteopenia    takes Vit D3 and Calcium daily    Surgeries: Procedure(s): RIGHT TOTAL KNEE ARTHROPLASTY on 06/15/2016   Consultants:   Discharged Condition: Improved  Hospital Course: Aimee Lawrence is an 70 y.o. female who was admitted 06/15/2016 for operative treatment ofPrimary localized osteoarthritis of right knee. Patient has severe unremitting pain that affects sleep, daily activities, and work/hobbies. After pre-op clearance the patient was taken to the operating room on 06/15/2016 and underwent  Procedure(s): RIGHT TOTAL KNEE ARTHROPLASTY.    Patient was given perioperative antibiotics: Anti-infectives    Start     Dose/Rate Route Frequency Ordered Stop   06/15/16 1930  vancomycin (VANCOCIN) IVPB 1000 mg/200 mL premix     1,000 mg 200 mL/hr over 60 Minutes Intravenous Every 12 hours 06/15/16 1436 06/15/16 1908   06/15/16 0700  vancomycin (VANCOCIN) IVPB 1000 mg/200 mL premix     1,000 mg 200 mL/hr over 60 Minutes Intravenous To ShortStay Surgical 06/14/16 1138 06/15/16 0718       Patient was given sequential compression devices, early ambulation, and chemoprophylaxis to prevent DVT.  Patient benefited maximally from hospital stay and there were no complications.    Recent vital signs: Patient Vitals  for the past 24 hrs:  BP Temp Temp src Pulse Resp SpO2  06/18/16 0428 (!) 142/66 98 F (36.7 C) Oral 91 17 98 %  06/17/16 2111 - 99.3 F (37.4 C) Oral - - -  06/17/16 1946 (!) 133/59 100.2 F (37.9 C) Oral (!) 103 18 95 %  06/17/16 1434 (!) 153/69 98.8 F (37.1 C) - 97 16 98 %     Recent laboratory studies: No results for input(s): WBC, HGB, HCT, PLT, NA, K, CL, CO2, BUN, CREATININE, GLUCOSE, INR, CALCIUM in the last 72 hours.  Invalid input(s): PT, 2   Discharge Medications:     Medication List    STOP taking these medications   traMADol 50 MG tablet Commonly known as:  ULTRAM     TAKE these medications   albuterol 108 (90 Base) MCG/ACT inhaler Commonly known as:  PROVENTIL HFA;VENTOLIN HFA Inhale 2 puffs into the lungs every 6 (six) hours as needed for wheezing or shortness of breath.   apixaban 2.5 MG Tabs tablet Commonly known as:  ELIQUIS Take 1 tablet (2.5 mg total) by mouth every 12 (twelve) hours.   CALCIUM 500/D PO Take 2 tablets by mouth daily.   fluticasone 50 MCG/ACT nasal spray Commonly known as:  FLONASE Place 2 sprays into both nostrils daily.   methocarbamol 750 MG tablet Commonly known as:  ROBAXIN Take 1 tablet (750 mg total) by mouth every 6 (six) hours as needed for muscle spasms.  oxyCODONE-acetaminophen 5-325 MG tablet Commonly known as:  ROXICET Take 1-2 tablets by mouth every 4 (four) hours as needed for severe pain.   promethazine 12.5 MG tablet Commonly known as:  PHENERGAN Take 1-2 tablets (12.5-25 mg total) by mouth every 6 (six) hours as needed for nausea or vomiting.   vitamin C 250 MG tablet Commonly known as:  ASCORBIC ACID Take 500 mg by mouth daily.   Vitamin D3 2000 units capsule Take 4,000 Units by mouth daily.            Durable Medical Equipment        Start     Ordered   06/15/16 1437  DME Walker rolling  Once    Question:  Patient needs a walker to treat with the following condition  Answer:  Primary  osteoarthritis of right knee   06/15/16 1436   06/15/16 1437  DME 3 n 1  Once     06/15/16 1436   06/15/16 1437  DME Bedside commode  Once    Question:  Patient needs a bedside commode to treat with the following condition  Answer:  Primary osteoarthritis of right knee   06/15/16 1436      Diagnostic Studies: Dg Chest 2 View  Result Date: 06/04/2016 CLINICAL DATA:  Preoperative evaluation. EXAM: CHEST  2 VIEW COMPARISON:  12/29/2012 FINDINGS: Both lungs are clear. Heart and mediastinum are within normal limits. The trachea is midline. Mild degenerative changes in the mid thoracic spine. No pleural effusions. Surgical clips in the upper abdomen. IMPRESSION: No active cardiopulmonary disease. Electronically Signed   By: Markus Daft M.D.   On: 06/04/2016 09:06    Disposition: 01-Home or Self Care  Discharge Instructions    Call MD / Call 911    Complete by:  As directed    If you experience chest pain or shortness of breath, CALL 911 and be transported to the hospital emergency room.  If you develope a fever above 101 F, pus (white drainage) or increased drainage or redness at the wound, or calf pain, call your surgeon's office.   Constipation Prevention    Complete by:  As directed    Drink plenty of fluids.  Prune juice may be helpful.  You may use a stool softener, such as Colace (over the counter) 100 mg twice a day.  Use MiraLax (over the counter) for constipation as needed.   Diet - low sodium heart healthy    Complete by:  As directed    Discharge instructions    Complete by:  As directed    INSTRUCTIONS AFTER JOINT REPLACEMENT   Remove items at home which could result in a fall. This includes throw rugs or furniture in walking pathways ICE to the affected joint every three hours while awake for 30 minutes at a time, for at least the first 3-5 days, and then as needed for pain and swelling.  Continue to use ice for pain and swelling. You may notice swelling that will progress down  to the foot and ankle.  This is normal after surgery.  Elevate your leg when you are not up walking on it.   Continue to use the breathing machine you got in the hospital (incentive spirometer) which will help keep your temperature down.  It is common for your temperature to cycle up and down following surgery, especially at night when you are not up moving around and exerting yourself.  The breathing machine keeps your lungs expanded and  your temperature down.   DIET:  As you were doing prior to hospitalization, we recommend a well-balanced diet.  DRESSING / WOUND CARE / SHOWERING  You may shower 3 days after surgery, but keep the wounds dry during showering.  You may use an occlusive plastic wrap (Press'n Seal for example), NO SOAKING/SUBMERGING IN THE BATHTUB.  If the bandage gets wet, change with a clean dry gauze.  If the incision gets wet, pat the wound dry with a clean towel.  ACTIVITY  Increase activity slowly as tolerated, but follow the weight bearing instructions below.   No driving for 6 weeks or until further direction given by your physician.  You cannot drive while taking narcotics.  No lifting or carrying greater than 10 lbs. until further directed by your surgeon. Avoid periods of inactivity such as sitting longer than an hour when not asleep. This helps prevent blood clots.  You may return to work once you are authorized by your doctor.     WEIGHT BEARING   Weight bearing as tolerated with assist device (walker, cane, etc) as directed, use it as long as suggested by your surgeon or therapist, typically at least 4-6 weeks.   EXERCISES  Results after joint replacement surgery are often greatly improved when you follow the exercise, range of motion and muscle strengthening exercises prescribed by your doctor. Safety measures are also important to protect the joint from further injury. Any time any of these exercises cause you to have increased pain or swelling, decrease what  you are doing until you are comfortable again and then slowly increase them. If you have problems or questions, call your caregiver or physical therapist for advice.   Rehabilitation is important following a joint replacement. After just a few days of immobilization, the muscles of the leg can become weakened and shrink (atrophy).  These exercises are designed to build up the tone and strength of the thigh and leg muscles and to improve motion. Often times heat used for twenty to thirty minutes before working out will loosen up your tissues and help with improving the range of motion but do not use heat for the first two weeks following surgery (sometimes heat can increase post-operative swelling).   These exercises can be done on a training (exercise) mat, on the floor, on a table or on a bed. Use whatever works the best and is most comfortable for you.    Use music or television while you are exercising so that the exercises are a pleasant break in your day. This will make your life better with the exercises acting as a break in your routine that you can look forward to.   Perform all exercises about fifteen times, three times per day or as directed.  You should exercise both the operative leg and the other leg as well.   Exercises include:   Quad Sets - Tighten up the muscle on the front of the thigh (Quad) and hold for 5-10 seconds.   Straight Leg Raises - With your knee straight (if you were given a brace, keep it on), lift the leg to 60 degrees, hold for 3 seconds, and slowly lower the leg.  Perform this exercise against resistance later as your leg gets stronger.  Leg Slides: Lying on your back, slowly slide your foot toward your buttocks, bending your knee up off the floor (only go as far as is comfortable). Then slowly slide your foot back down until your leg is flat on the  floor again.  Angel Wings: Lying on your back spread your legs to the side as far apart as you can without causing  discomfort.  Hamstring Strength:  Lying on your back, push your heel against the floor with your leg straight by tightening up the muscles of your buttocks.  Repeat, but this time bend your knee to a comfortable angle, and push your heel against the floor.  You may put a pillow under the heel to make it more comfortable if necessary.   A rehabilitation program following joint replacement surgery can speed recovery and prevent re-injury in the future due to weakened muscles. Contact your doctor or a physical therapist for more information on knee rehabilitation.    CONSTIPATION  Constipation is defined medically as fewer than three stools per week and severe constipation as less than one stool per week.  Even if you have a regular bowel pattern at home, your normal regimen is likely to be disrupted due to multiple reasons following surgery.  Combination of anesthesia, postoperative narcotics, change in appetite and fluid intake all can affect your bowels.   YOU MUST use at least one of the following options; they are listed in order of increasing strength to get the job done.  They are all available over the counter, and you may need to use some, POSSIBLY even all of these options:    Drink plenty of fluids (prune juice may be helpful) and high fiber foods Colace 100 mg by mouth twice a day  Senokot for constipation as directed and as needed Dulcolax (bisacodyl), take with full glass of water  Miralax (polyethylene glycol) once or twice a day as needed.  If you have tried all these things and are unable to have a bowel movement in the first 3-4 days after surgery call either your surgeon or your primary doctor.    If you experience loose stools or diarrhea, hold the medications until you stool forms back up.  If your symptoms do not get better within 1 week or if they get worse, check with your doctor.  If you experience "the worst abdominal pain ever" or develop nausea or vomiting, please contact  the office immediately for further recommendations for treatment.   ITCHING:  If you experience itching with your medications, try taking only a single pain pill, or even half a pain pill at a time.  You can also use Benadryl over the counter for itching or also to help with sleep.   TED HOSE STOCKINGS:  Use stockings on both legs until for at least 2 weeks or as directed by physician office. They may be removed at night for sleeping.  MEDICATIONS:  See your medication summary on the "After Visit Summary" that nursing will review with you.  You may have some home medications which will be placed on hold until you complete the course of blood thinner medication.  It is important for you to complete the blood thinner medication as prescribed.  PRECAUTIONS:  If you experience chest pain or shortness of breath - call 911 immediately for transfer to the hospital emergency department.   If you develop a fever greater that 101 F, purulent drainage from wound, increased redness or drainage from wound, foul odor from the wound/dressing, or calf pain - CONTACT YOUR SURGEON.  FOLLOW-UP APPOINTMENTS:  If you do not already have a post-op appointment, please call the office for an appointment to be seen by your surgeon.  Guidelines for how soon to be seen are listed in your "After Visit Summary", but are typically between 1-4 weeks after surgery.  OTHER INSTRUCTIONS:   Knee Replacement:  Do not place pillow under knee, focus on keeping the knee straight while resting. CPM instructions: 0-90 degrees, 2 hours in the morning, 2 hours in the afternoon, and 2 hours in the evening. Place foam block, curve side up under heel at all times except when in CPM or when walking.  DO NOT modify, tear, cut, or change the foam block in any way.  MAKE SURE YOU:  Understand these instructions.  Get help right away if you are not doing well or get worse.    Thank you for  letting us be a part of your medical care team.  It is a privilege we respect greatly.  We hope these instructions will help you stay on track for a fast and full recovery!   Increase activity slowly as tolerated    Complete by:  As directed       Follow-up Information    DALLDORF,PETER G, MD. Schedule an appointment as soon as possible for a visit in 2 weeks.   Specialty:  Orthopedic Surgery Contact information: [place] [id number] [phone]            Signed: Rich Fuchs 06/18/2016, 7:48 AM

## 2016-06-18 NOTE — Progress Notes (Signed)
Physical Therapy Treatment Patient Details Name: Aimee Lawrence MRN: [id number] DOB: 1946-01-28 Today's Date: 06/18/2016    History of Present Illness Pt admitted for elective R TKA.    PT Comments    Pt presents with continued difficulty maneuvering RLE OOB without assistance. Pt is able to perform SLR with active assistance with improved quad control. Pt continues to benefit from SNF placement for strengthening of RLE before DC  Follow Up Recommendations  SNF     Equipment Recommendations  Rolling walker with 5" wheels;3in1 (PT)    Recommendations for Other Services       Precautions / Restrictions Precautions Precautions: Knee Precaution Booklet Issued: Yes (comment) Precaution Comments: reviewed no pillow under knee Required Braces or Orthoses: Knee Immobilizer - Right Knee Immobilizer - Right: Discontinue once straight leg raise with < 10 degree lag Restrictions Weight Bearing Restrictions: Yes RLE Weight Bearing: Weight bearing as tolerated    Mobility  Bed Mobility Overal bed mobility: Needs Assistance Bed Mobility: Supine to Sit     Supine to sit: Min assist     General bed mobility comments: Min A to bring RLE EOB  Transfers Overall transfer level: Needs assistance Equipment used: Rolling walker (2 wheeled) Transfers: Sit to/from Stand Sit to Stand: Min assist         General transfer comment: min a for safety from EOB and commode  Ambulation/Gait Ambulation/Gait assistance: Min guard Ambulation Distance (Feet): 30 Feet Assistive device: Rolling walker (2 wheeled) Gait Pattern/deviations: Decreased step length - left;Antalgic;Trunk flexed Gait velocity: decreased Gait velocity interpretation: Below normal speed for age/gender General Gait Details: cues for heel strike on RLE to improve wbing   Stairs            Wheelchair Mobility    Modified Rankin (Stroke Patients Only)       Balance                                     Cognition Arousal/Alertness: Awake/alert Behavior During Therapy: WFL for tasks assessed/performed Overall Cognitive Status: Within Functional Limits for tasks assessed                      Exercises Total Joint Exercises Ankle Circles/Pumps: AROM;Both;20 reps;Supine Quad Sets: Strengthening;Both;10 reps;Supine Short Arc Quad: Right;10 reps;Supine;AAROM Hip ABduction/ADduction: AAROM;Right;10 reps;Supine Straight Leg Raises: Left;10 reps;Supine;AAROM    General Comments        Pertinent Vitals/Pain Pain Assessment: Faces Faces Pain Scale: Hurts little more Pain Location: R knee Pain Descriptors / Indicators: Discomfort;Guarding Pain Intervention(s): Monitored during session;Premedicated before session;Repositioned;Ice applied    Home Living                      Prior Function            PT Goals (current goals can now be found in the care plan section) Acute Rehab PT Goals Patient Stated Goal: rehab then home Progress towards PT goals: Progressing toward goals    Frequency    7X/week      PT Plan Current plan remains appropriate    Co-evaluation             End of Session Equipment Utilized During Treatment: Gait belt;Right knee immobilizer Activity Tolerance: Patient tolerated treatment well Patient left: in chair;with call bell/phone within reach     Time: 1005-1033 PT Time Calculation (min) (ACUTE ONLY):  28 min  Charges:  $Gait Training: 8-22 mins $Therapeutic Exercise: 8-22 mins                    G Codes:      Scheryl Marten PT, DPT  [phone]  06/18/2016, 11:01 AM

## 2016-06-18 NOTE — Progress Notes (Signed)
Pt discharge education and instructions completed with pt. Pt IV removed; pt discharge to [facility] with [facility] to transport pt disposition. Report called off to [facility] at [facility]. Pt knee incision dsg remains clean, dry and intact with no stain or active bleeding noted. Pt transported off unit stretcher with belongings and family to the side. Delia Heady RN

## 2016-06-21 ENCOUNTER — Non-Acute Institutional Stay (SKILLED_NURSING_FACILITY): Payer: Medicare Other | Admitting: Adult Health

## 2016-06-21 ENCOUNTER — Encounter: Payer: Self-pay | Admitting: Adult Health

## 2016-06-21 DIAGNOSIS — R2681 Unsteadiness on feet: Secondary | ICD-10-CM | POA: Diagnosis not present

## 2016-06-21 DIAGNOSIS — M8580 Other specified disorders of bone density and structure, unspecified site: Secondary | ICD-10-CM | POA: Diagnosis not present

## 2016-06-21 DIAGNOSIS — J4520 Mild intermittent asthma, uncomplicated: Secondary | ICD-10-CM

## 2016-06-21 DIAGNOSIS — M1711 Unilateral primary osteoarthritis, right knee: Secondary | ICD-10-CM

## 2016-06-21 DIAGNOSIS — J309 Allergic rhinitis, unspecified: Secondary | ICD-10-CM

## 2016-06-21 NOTE — Progress Notes (Addendum)
DATE:  06/21/2016   MRN:  [id number]  BIRTHDAY: 1945-12-16  Facility:  [facility]ing Home Location:  [place] Room Number: 1206-P  LEVEL OF CARE:  SNF (31)  Contact Information    Name Relation Home Work Mobile   [place] Friend [phone]         Code Status History    Date Active Date Inactive Code Status Order ID Comments User Context   06/15/2016  2:36 PM 06/18/2016  4:26 PM Full Code [id number]  Loni Dolly, PA-C Inpatient       Chief Complaint  Patient presents with  . Hospitalization Follow-up    HISTORY OF PRESENT ILLNESS:  This is a 70 year old female who has been admitted to [facility] on 06/18/16 following an admission at [facility] from 06/15/16 to 06/18/2016 for a right total knee arthroplasty.    She has a PMH of asthma, GERD, history of shingles, and osteopenia.    She has been admitted for short-term rehabilitation.     PAST MEDICAL HISTORY:  Past Medical History:  Diagnosis Date  . Allergy    takes Doxycycline daily and uses Flonase daily  . Arthritis   . Asthma    Albuterol inhaler as needed.  Marland Kitchen Dyspnea    rarely and sitting  . GERD (gastroesophageal reflux disease)    not on any meds  . History of bronchitis   . History of shingles   . Hx of colonic polyps    benign  . Joint pain   . Joint swelling   . Osteopenia    takes Vit D3 and Calcium daily  . Primary localized osteoarthritis of right knee      CURRENT MEDICATIONS: Reviewed  Patient's Medications  New Prescriptions   No medications on file  Previous Medications   ACETAMINOPHEN (TYLENOL) 325 MG TABLET    Take 650 mg by mouth every 4 (four) hours as needed for mild pain or fever.    ALBUTEROL (PROVENTIL HFA;VENTOLIN HFA) 108 (90 BASE) MCG/ACT INHALER    Inhale 2 puffs into the lungs every 6 (six) hours as needed for wheezing or shortness of breath.   APIXABAN (ELIQUIS) 2.5 MG TABS TABLET    Take 1 tablet (2.5 mg  total) by mouth every 12 (twelve) hours.   CALCIUM CARBONATE-VITAMIN D (CALCIUM 500/D PO)    Take 2 tablets by mouth daily.   CHOLECALCIFEROL (VITAMIN D3) 2000 UNITS CAPSULE    Take 4,000 Units by mouth daily. Take 2 tablets to = 4000 units qd   FLUTICASONE (FLONASE) 50 MCG/ACT NASAL SPRAY    Place 2 sprays into both nostrils daily.    METHOCARBAMOL (ROBAXIN) 750 MG TABLET    Take 1 tablet (750 mg total) by mouth every 6 (six) hours as needed for muscle spasms.   OXYCODONE-ACETAMINOPHEN (ROXICET) 5-325 MG TABLET    Take 1-2 tablets by mouth every 4 (four) hours as needed for severe pain.   PROMETHAZINE (PHENERGAN) 12.5 MG TABLET    Take 1-2 tablets (12.5-25 mg total) by mouth every 6 (six) hours as needed for nausea or vomiting.   VITAMIN C (ASCORBIC ACID) 500 MG TABLET    Take 500 mg by mouth daily.  Modified Medications   No medications on file  Discontinued Medications   VITAMIN C (ASCORBIC ACID) 250 MG TABLET    Take 500 mg by mouth daily.     Allergies  Allergen Reactions  . Aleve [Naproxen  Sodium] Shortness Of Breath  . Aspirin Shortness Of Breath    Trouble breathing  . Ibuprofen Shortness Of Breath    Short of breath  . Ampicillin Rash     Has patient had a PCN reaction causing immediate rash, facial/tongue/throat swelling, SOB or lightheadedness with hypotension:   # # UNKNOWN # #  Has patient had a PCN reaction that required hospitalization    # # NO # #  Has patient had a PCN reaction occurring within the last 10 years:    # # NO # #  If all of the above answers are "NO", then may proceed with Cephalosporin use.   . Erythromycin Nausea And Vomiting     REVIEW OF SYSTEMS:  GENERAL: No change in appetite, no fatigue, no weight changes, no fever, chills or weakness EYES: Denies change in vision, dry eyes, eye pain, itching or discharge EARS: Denies change in hearing, ringing in ears, or earache NOSE: Denies nasal congestion or epistaxis MOUTH and THROAT: Denies oral  discomfort, gingival pain or bleeding, pain from teeth or hoarseness   RESPIRATORY: no cough, SOB, DOE, wheezing, hemoptysis CARDIAC: no chest pain, edema or palpitations GI: no abdominal pain, diarrhea, constipation, heart burn, nausea or vomiting GU: Denies dysuria, frequency, hematuria, incontinence, or discharge PSYCHIATRIC: Denies feeling of depression or anxiety. No report of hallucinations, insomnia, paranoia, or agitation    PHYSICAL EXAMINATION  GENERAL APPEARANCE: Well nourished. In no acute distress. Obese SKIN:  Right knee surgical incision is covered with Aquacel dressing, dry, no erythema HEAD: Normal in size and contour. No evidence of trauma EYES: Lids open and close normally. No blepharitis, entropion or ectropion. PERRL. Conjunctivae are clear and sclerae are white. Lenses are without opacity EARS: Pinnae are normal. Patient hears normal voice tunes of the examiner MOUTH and THROAT: Lips are without lesions. Oral mucosa is moist and without lesions. Tongue is normal in shape, size, and color and without lesions NECK: supple, trachea midline, no neck masses, no thyroid tenderness, no thyromegaly LYMPHATICS: no LAN in the neck, no supraclavicular LAN RESPIRATORY: breathing is even & unlabored, BS CTAB CARDIAC: RRR, no murmur,no extra heart sounds, no edema GI: abdomen soft, normal BS, no masses, no tenderness, no hepatomegaly, no splenomegaly EXTREMITIES: Able to move 4 extremities PSYCHIATRIC: Alert and oriented X 3. Affect and behavior are appropriate  LABS/RADIOLOGY: Labs reviewed: Basic Metabolic Panel:  Recent Labs  06/04/16 0851  NA 139  K 4.2  CL 102  CO2 30  GLUCOSE 103*  BUN 22*  CREATININE 0.75  CALCIUM 9.3   CBC:  Recent Labs  06/04/16 0851  WBC 6.8  NEUTROABS 3.6  HGB 12.9  HCT 40.6  MCV 92.1  PLT 258    Dg Chest 2 View  Result Date: 06/04/2016 CLINICAL DATA:  Preoperative evaluation. EXAM: CHEST  2 VIEW COMPARISON:  12/29/2012  FINDINGS: Both lungs are clear. Heart and mediastinum are within normal limits. The trachea is midline. Mild degenerative changes in the mid thoracic spine. No pleural effusions. Surgical clips in the upper abdomen. IMPRESSION: No active cardiopulmonary disease. Electronically Signed   By: Markus Daft M.D.   On: 06/04/2016 09:06    ASSESSMENT/PLAN:  Unsteady gait - for rehabilitation, for OT and PT, for therapeutic strengthening exercises; fall precaution    Primary osteoarthritis S/P right total knee arthroplasty -  for rehabilitation, for OT and PT, for therapeutic strengthening exercises;  RLE WBAT, continue Eliquis 2.5 mg, 1 tab po q12h for DVT prophylaxis;  Robaxin 750 mg, 1 tab po q6h prn for muscle spasm; Percocet 5-325 1-2 tabs q4h prn and Tylenol 325 mg, 2 tabs to = 650 mg q6h prn for pain;   follow up with orthopedic surgeon, Dr. Melrose Nakayama, in 2 weeks; check CBC and BMP  Asthma - no SOB; continue albuterol prn  Allergic rhinitis -  continue Flonase 50 mcg 2 sprays into both nostrils daily  Osteopenia - continue Vitamin D3 2000 units, taking 2 capsules to = 4000 units qd and calcium with D 500 mg-200 mg, 2 tabs po qd.     Goals of care:  Short-term rehabilitation    Monina C. [place] - NP [employer] [phone]

## 2016-06-22 ENCOUNTER — Non-Acute Institutional Stay (SKILLED_NURSING_FACILITY): Payer: Medicare Other | Admitting: Internal Medicine

## 2016-06-22 ENCOUNTER — Encounter: Payer: Self-pay | Admitting: Internal Medicine

## 2016-06-22 ENCOUNTER — Encounter: Payer: Self-pay | Admitting: Adult Health

## 2016-06-22 DIAGNOSIS — R2681 Unsteadiness on feet: Secondary | ICD-10-CM | POA: Diagnosis not present

## 2016-06-22 DIAGNOSIS — M1711 Unilateral primary osteoarthritis, right knee: Secondary | ICD-10-CM | POA: Diagnosis not present

## 2016-06-22 DIAGNOSIS — I10 Essential (primary) hypertension: Secondary | ICD-10-CM

## 2016-06-22 DIAGNOSIS — J4520 Mild intermittent asthma, uncomplicated: Secondary | ICD-10-CM | POA: Diagnosis not present

## 2016-06-22 LAB — BASIC METABOLIC PANEL
BUN: 15 mg/dL (ref 4–21)
Creatinine: 0.7 mg/dL (ref 0.5–1.1)
GLUCOSE: 133 mg/dL
POTASSIUM: 4.2 mmol/L (ref 3.4–5.3)
SODIUM: 141 mmol/L (ref 137–147)

## 2016-06-22 LAB — CBC AND DIFFERENTIAL
HEMATOCRIT: 37 % (ref 36–46)
HEMOGLOBIN: 12.1 g/dL (ref 12.0–16.0)
NEUTROS ABS: 5 /uL
Platelets: 354 10*3/uL (ref 150–399)
WBC: 7.5 10^3/mL

## 2016-06-22 NOTE — Progress Notes (Signed)
This encounter was created in error - please disregard.

## 2016-06-22 NOTE — Progress Notes (Signed)
LOCATION: [facility]  PCP: [name], MD   Code Status: Full Code  Goals of care: Advanced Directive information Advanced Directives 06/11/2016  Does Patient Have a Medical Advance Directive? Yes  Type of Advance Directive -  Does patient want to make changes to medical advance directive? -  Copy of [place] in Chart? -       Extended Emergency Contact Information Primary Emergency Contact: Lemper,Carol Address: [address]          [place], [place] [address] [place] Phone: [phone] Mobile Phone: [phone] Relation: Friend Secondary Emergency Contact: Angelica Pou of [place] Phone: [phone] Relation: Friend   Allergies  Allergen Reactions  . Aleve [Naproxen Sodium] Shortness Of Breath  . Aspirin Shortness Of Breath    Trouble breathing  . Ibuprofen Shortness Of Breath    Short of breath  . Ampicillin Rash     Has patient had a PCN reaction causing immediate rash, facial/tongue/throat swelling, SOB or lightheadedness with hypotension:   # # UNKNOWN # #  Has patient had a PCN reaction that required hospitalization    # # NO # #  Has patient had a PCN reaction occurring within the last 10 years:    # # NO # #  If all of the above answers are "NO", then may proceed with Cephalosporin use.   . Erythromycin Nausea And Vomiting    Chief Complaint  Patient presents with  . New Admit To SNF    New Admission Visit      HPI:  Patient is a 70 y.o. female seen today for short term rehabilitation post hospital admission from 06/15/16-06/18/16 with right knee OA. She underwent right total knee arthroplasty. She is seen in her room today.   Review of Systems:  Constitutional: Negative for fever, chills, diaphoresis. Energy level is slowly coming back.  HENT: Negative for headache, congestion, nasal discharge Eyes: Negative for blurred vision, double vision and discharge.  Respiratory:  Negative for cough, shortness of breath and wheezing.   Cardiovascular: Negative for chest pain, palpitations, leg swelling.  Gastrointestinal: Negative for heartburn, nausea, vomiting, abdominal pain. Last bowel movement was today. Genitourinary: Negative for dysuria and flank pain.  Musculoskeletal: Negative for back pain, fall in the facility.  Skin: Negative for itching, rash.  Neurological: Negative for dizziness. Psychiatric/Behavioral: Negative for depression   Past Medical History:  Diagnosis Date  . Allergy    takes Doxycycline daily and uses Flonase daily  . Arthritis   . Asthma    Albuterol inhaler as needed.  Marland Kitchen Dyspnea    rarely and sitting  . GERD (gastroesophageal reflux disease)    not on any meds  . History of bronchitis   . History of shingles   . Hx of colonic polyps    benign  . Joint pain   . Joint swelling   . Osteopenia    takes Vit D3 and Calcium daily  . Primary localized osteoarthritis of right knee    Past Surgical History:  Procedure Laterality Date  . CHOLECYSTECTOMY  2007   lap choli  . COLONOSCOPY  multiple since 2003   per Dr. Carlean Purl, adenomatous polyps, recall 2022  . NASAL POLYP SURGERY  2007  . SINUS ENDO W/FUSION  06/27/2012   Procedure: ENDOSCOPIC SINUS SURGERY WITH FUSION NAVIGATION;  Surgeon: Rozetta Nunnery, MD;  Location: [facility];  Service: ENT;  Laterality: N/A;  FESS  FUSION PROTOCOL  . TOTAL KNEE ARTHROPLASTY Right 06/15/2016   Procedure: RIGHT TOTAL KNEE ARTHROPLASTY;  Surgeon: Melrose Nakayama, MD;  Location: [facility];  Service: Orthopedics;  Laterality: Right;   Social History:   reports that she has never smoked. She has never used smokeless tobacco. She reports that she does not drink alcohol or use drugs.  Family History  Problem Relation Age of Onset  . Alcohol abuse    . Arthritis    . Diabetes    . Heart disease    . Breast cancer Mother   . Pneumonia Mother   . Colon cancer Neg Hx   . Stomach  cancer Neg Hx     Medications:   Medication List       Accurate as of 06/22/16  3:01 PM. Always use your most recent med list.          acetaminophen 325 MG tablet Commonly known as:  TYLENOL Take 650 mg by mouth every 4 (four) hours as needed for mild pain or fever.   albuterol 108 (90 Base) MCG/ACT inhaler Commonly known as:  PROVENTIL HFA;VENTOLIN HFA Inhale 2 puffs into the lungs every 6 (six) hours as needed for wheezing or shortness of breath.   apixaban 2.5 MG Tabs tablet Commonly known as:  ELIQUIS Take 1 tablet (2.5 mg total) by mouth every 12 (twelve) hours.   CALCIUM 500/D PO Take 2 tablets by mouth daily.   fluticasone 50 MCG/ACT nasal spray Commonly known as:  FLONASE Place 2 sprays into both nostrils daily.   methocarbamol 750 MG tablet Commonly known as:  ROBAXIN Take 1 tablet (750 mg total) by mouth every 6 (six) hours as needed for muscle spasms.   oxyCODONE-acetaminophen 5-325 MG tablet Commonly known as:  ROXICET Take 1-2 tablets by mouth every 4 (four) hours as needed for severe pain.   promethazine 12.5 MG tablet Commonly known as:  PHENERGAN Take 1-2 tablets (12.5-25 mg total) by mouth every 6 (six) hours as needed for nausea or vomiting.   vitamin C 500 MG tablet Commonly known as:  ASCORBIC ACID Take 500 mg by mouth daily.   Vitamin D3 2000 units capsule Take 4,000 Units by mouth daily. Take 2 tablets to = 4000 units qd       Immunizations: Immunization History  Administered Date(s) Administered  . Influenza,inj,Quad PF,36+ Mos 03/28/2013, 05/28/2014  . Influenza-Unspecified 05/11/2015  . Pneumococcal Conjugate-13 06/30/2015  . Pneumococcal Polysaccharide-23 07/13/2007  . Td 05/28/2010  . Zoster 07/12/2004     Physical Exam:  Vitals:   06/22/16 1458  BP: (!) 153/71  Pulse: 86  Resp: 16  Temp: 98.9 F (37.2 C)  TempSrc: Oral  SpO2: 95%  Weight: 161 lb (73 kg)  Height: 5\' 1"  (1.549 m)   Body mass index is 30.42  kg/m.  General- elderly female, obese, in no acute distress Head- normocephalic, atraumatic Nose- no nasal discharge Throat- moist mucus membrane Eyes- PERRLA, EOMI, no pallor, no icterus, no discharge, normal conjunctiva, normal sclera Neck- no cervical lymphadenopathy Cardiovascular- normal s1,s2, no murmur Respiratory- bilateral clear to auscultation, no wheeze, no rhonchi, no crackles, no use of accessory muscles Abdomen- bowel sounds present, soft, non tender Musculoskeletal- able to move all 4 extremities, limited right knee range of motion, 1+ right leg edema Neurological- alert and oriented to person, place and time Skin- warm and dry, right knee surgical incision with aquacel dressing in place Psychiatry- normal mood and affect    Labs reviewed: Basic  Metabolic Panel:  Recent Labs  06/04/16 0851  NA 139  K 4.2  CL 102  CO2 30  GLUCOSE 103*  BUN 22*  CREATININE 0.75  CALCIUM 9.3   CBC:  Recent Labs  06/04/16 0851  WBC 6.8  NEUTROABS 3.6  HGB 12.9  HCT 40.6  MCV 92.1  PLT 258    Radiological Exams: Dg Chest 2 View  Result Date: 06/04/2016 CLINICAL DATA:  Preoperative evaluation. EXAM: CHEST  2 VIEW COMPARISON:  12/29/2012 FINDINGS: Both lungs are clear. Heart and mediastinum are within normal limits. The trachea is midline. Mild degenerative changes in the mid thoracic spine. No pleural effusions. Surgical clips in the upper abdomen. IMPRESSION: No active cardiopulmonary disease. Electronically Signed   By: Markus Daft M.D.   On: 06/04/2016 09:06    Assessment/Plan  Gait instability Will have patient work with PT/OT as tolerated to regain strength and restore function.  Fall precautions are in place.  Right knee OA S/p right total knee arthroplasty. Has orthopedic follow up. Continue oxycodone-APAP 5-325 mg 1-2 tab q4h prn pain. Continue robaxin 750 mg q6h prn muscle spasm. Continue eliquis for DVT prophylaxis. RLE WBAT. Get PMR consult. Add ted hose.  Get PMR consult. Continue ca-vit d  Asthma  Stable, continue albuterol as needed and monitor  HTN Not on any med, monitor bp reading  Goals of care: short term rehabilitation   Labs/tests ordered: cbc, bmp  Family/ staff Communication: reviewed care plan with patient and nursing supervisor    Blanchie Serve, MD Internal Medicine [place], [place] [address] Cell Phone (Monday-Friday 8 am - 5 pm): [phone] On Call: [phone] and follow prompts after 5 pm and on weekends Office Phone: [phone] Office Fax: [phone]

## 2016-06-25 ENCOUNTER — Non-Acute Institutional Stay (SKILLED_NURSING_FACILITY): Payer: Medicare Other | Admitting: Adult Health

## 2016-06-25 ENCOUNTER — Encounter: Payer: Self-pay | Admitting: Adult Health

## 2016-06-25 DIAGNOSIS — M1711 Unilateral primary osteoarthritis, right knee: Secondary | ICD-10-CM

## 2016-06-25 DIAGNOSIS — J4520 Mild intermittent asthma, uncomplicated: Secondary | ICD-10-CM | POA: Diagnosis not present

## 2016-06-25 DIAGNOSIS — R2681 Unsteadiness on feet: Secondary | ICD-10-CM

## 2016-06-25 DIAGNOSIS — I10 Essential (primary) hypertension: Secondary | ICD-10-CM

## 2016-06-25 DIAGNOSIS — Z471 Aftercare following joint replacement surgery: Secondary | ICD-10-CM | POA: Diagnosis not present

## 2016-06-25 DIAGNOSIS — Z96651 Presence of right artificial knee joint: Secondary | ICD-10-CM | POA: Diagnosis not present

## 2016-06-25 NOTE — Progress Notes (Signed)
DATE:  06/25/2016   MRN:  [id number]  BIRTHDAY: March 15, 1946  Facility:  Nursing Home Location:  [place] Room Number: 1206-P  LEVEL OF CARE:  SNF [id number])  Contact Information    Name Relation Home Work Mobile   [place] Friend [phone]  [place] [phone]         Code Status History    Date Active Date Inactive Code Status Order ID Comments User Context   06/15/2016  2:36 PM 06/18/2016  4:26 PM Full Code [id number]  Loni Dolly, PA-C Inpatient       Chief Complaint  Patient presents with  . Discharge Note    HISTORY OF PRESENT ILLNESS:  She is being seen today for a discharge visit.  She was admitted to [facility] for short-term rehabilitation on 06/18/16 following an admission at [facility] 12/5-12/8/17 for right total knee arthroplasty. She is being discharged home on 06/26/16 with home health PT and OT.    Patient was admitted to this facility for short-term rehabilitation after the patient's recent hospitalization.  Patient has completed SNF rehabilitation and therapy has cleared the patient for discharge.   PAST MEDICAL HISTORY:  Past Medical History:  Diagnosis Date  . Allergy    takes Doxycycline daily and uses Flonase daily  . Arthritis   . Asthma    Albuterol inhaler as needed.  Marland Kitchen Dyspnea    rarely and sitting  . GERD (gastroesophageal reflux disease)    not on any meds  . History of bronchitis   . History of shingles   . Hx of colonic polyps    benign  . Joint pain   . Joint swelling   . Osteopenia    takes Vit D3 and Calcium daily  . Primary localized osteoarthritis of right knee      CURRENT MEDICATIONS: Reviewed  Patient's Medications  New Prescriptions   No medications on file  Previous Medications   ACETAMINOPHEN (TYLENOL) 325 MG TABLET    Take 650 mg by mouth every 4 (four) hours as needed for mild pain or fever.    ALBUTEROL (PROVENTIL HFA;VENTOLIN  HFA) 108 (90 BASE) MCG/ACT INHALER    Inhale 2 puffs into the lungs every 6 (six) hours as needed for wheezing or shortness of breath.   APIXABAN (ELIQUIS) 2.5 MG TABS TABLET    Take 1 tablet (2.5 mg total) by mouth every 12 (twelve) hours.   CALCIUM CARBONATE-VITAMIN D (CALCIUM 500/D PO)    Take 2 tablets by mouth daily. 500 mg-200 mg   CHOLECALCIFEROL (VITAMIN D3) 2000 UNITS CAPSULE    Take 4,000 Units by mouth daily. Take 2 tablets to = 4000 units qd   FLUTICASONE (FLONASE) 50 MCG/ACT NASAL SPRAY    Place 2 sprays into both nostrils daily.    METHOCARBAMOL (ROBAXIN) 750 MG TABLET    Take 1 tablet (750 mg total) by mouth every 6 (six) hours as needed for muscle spasms.   OXYCODONE-ACETAMINOPHEN (ROXICET) 5-325 MG TABLET    Take 1-2 tablets by mouth every 4 (four) hours as needed for severe pain.   PROMETHAZINE (PHENERGAN) 12.5 MG TABLET    Take 1-2 tablets (12.5-25 mg total) by mouth every 6 (six) hours as needed for nausea or vomiting.   VITAMIN C (ASCORBIC ACID) 500 MG TABLET    Take 500 mg by mouth daily.  Modified Medications   No medications on file  Discontinued Medications  No medications on file     Allergies  Allergen Reactions  . Aleve [Naproxen Sodium] Shortness Of Breath  . Aspirin Shortness Of Breath    Trouble breathing  . Ibuprofen Shortness Of Breath    Short of breath  . Ampicillin Rash     Has patient had a PCN reaction causing immediate rash, facial/tongue/throat swelling, SOB or lightheadedness with hypotension:   # # UNKNOWN # #  Has patient had a PCN reaction that required hospitalization    # # NO # #  Has patient had a PCN reaction occurring within the last 10 years:    # # NO # #  If all of the above answers are "NO", then may proceed with Cephalosporin use.   . Erythromycin Nausea And Vomiting     REVIEW OF SYSTEMS:  GENERAL: no change in appetite, no fatigue, no weight changes, no fever, chills or weakness EYES: Denies change in vision, dry eyes, eye  pain, itching or discharge EARS: Denies change in hearing, ringing in ears, or earache NOSE: Denies nasal congestion or epistaxis MOUTH and THROAT: Denies oral discomfort, gingival pain or bleeding, pain from teeth or hoarseness   RESPIRATORY: no cough, SOB, DOE, wheezing, hemoptysis CARDIAC: no chest pain or palpitations GI: no abdominal pain, diarrhea, constipation, heart burn, nausea or vomiting GU: Denies dysuria, frequency, hematuria, incontinence, or discharge PSYCHIATRIC: Denies feeling of depression or anxiety. No report of hallucinations, insomnia, paranoia, or agitation    PHYSICAL EXAMINATION  GENERAL APPEARANCE: Well nourished. In no acute distress. Obese SKIN:  Right knee surgical incision is covered with aquacel dressing, dry and no erythema HEAD: Normal in size and contour. No evidence of trauma EYES: Lids open and close normally. No blepharitis, entropion or ectropion. PERRL. Conjunctivae are clear and sclerae are white. Lenses are without opacity EARS: Pinnae are normal. Patient hears normal voice tunes of the examiner MOUTH and THROAT: Lips are without lesions. Oral mucosa is moist and without lesions. Tongue is normal in shape, size, and color and without lesions NECK: supple, trachea midline, no neck masses, no thyroid tenderness, no thyromegaly LYMPHATICS: no LAN in the neck, no supraclavicular LAN RESPIRATORY: breathing is even & unlabored, BS CTAB CARDIAC: RRR, no murmur,no extra heart sounds, RLE 1+ edema GI: abdomen soft, normal BS, no masses, no tenderness, no hepatomegaly, no splenomegaly EXTREMITIES:   Able to move X 4 extremities PSYCHIATRIC: Alert and oriented X 3. Affect and behavior are appropriate   LABS/RADIOLOGY: Labs reviewed: Basic Metabolic Panel:  Recent Labs  06/04/16 0851 06/22/16  NA 139 141  K 4.2 4.2  CL 102  --   CO2 30  --   GLUCOSE 103*  --   BUN 22* 15  CREATININE 0.75 0.7  CALCIUM 9.3  --     CBC:  Recent Labs   06/04/16 0851 06/22/16  WBC 6.8 7.5  NEUTROABS 3.6 5  HGB 12.9 12.1  HCT 40.6 37  MCV 92.1  --   PLT 258 354    Dg Chest 2 View  Result Date: 06/04/2016 CLINICAL DATA:  Preoperative evaluation. EXAM: CHEST  2 VIEW COMPARISON:  12/29/2012 FINDINGS: Both lungs are clear. Heart and mediastinum are within normal limits. The trachea is midline. Mild degenerative changes in the mid thoracic spine. No pleural effusions. Surgical clips in the upper abdomen. IMPRESSION: No active cardiopulmonary disease. Electronically Signed   By: Markus Daft M.D.   On: 06/04/2016 09:06    ASSESSMENT/PLAN:  1. Gait instability -  for home health PT and OT, for therapeutic strengthening exercises; fall precautions   2. Primary osteoarthritis of right knee - for home health PT and OT, for therapeutic strengthening exercises; follow-up with orthopedic surgeon, continue Tylenol 325 mg 2 tabs = 650 mg by mouth every 4 hours when necessary and Percocet 5-325 mg 1-2 tabs by mouth every 4 hours when necessary for pain; Robaxin 500 mg 1 tab by mouth every 6 hours when necessary for muscle spasm; Eliquis 2.5 mg 1 tab by mouth every 12 hours for DVT prophylaxis   3. Essential hypertension - well-controlled, not on any medication   4. Mild intermittent asthma without complication - no wheezing; continue Flonase 50 g 2 sprays into both nostrils daily and Proventil HFA 90 g inhaler 2 puffs into lungs every 6 hours when necessary      I have filled out patient's discharge paperwork and written prescriptions.  Patient will receive home health PT and OT.  DME provided:  None  Total discharge time: Less than 30 minutes  Discharge time involved coordination of the discharge process with social worker, nursing staff and therapy department. Medical justification for home health services verified.   Aimee Lawrence - NP    [employer] [phone]

## 2016-06-29 ENCOUNTER — Telehealth: Payer: Self-pay | Admitting: Family Medicine

## 2016-06-29 DIAGNOSIS — J4520 Mild intermittent asthma, uncomplicated: Secondary | ICD-10-CM | POA: Diagnosis not present

## 2016-06-29 DIAGNOSIS — I10 Essential (primary) hypertension: Secondary | ICD-10-CM | POA: Diagnosis not present

## 2016-06-29 DIAGNOSIS — Z7901 Long term (current) use of anticoagulants: Secondary | ICD-10-CM | POA: Diagnosis not present

## 2016-06-29 DIAGNOSIS — Z471 Aftercare following joint replacement surgery: Secondary | ICD-10-CM | POA: Diagnosis not present

## 2016-06-29 DIAGNOSIS — Z9181 History of falling: Secondary | ICD-10-CM | POA: Diagnosis not present

## 2016-06-29 DIAGNOSIS — M8580 Other specified disorders of bone density and structure, unspecified site: Secondary | ICD-10-CM | POA: Diagnosis not present

## 2016-06-29 DIAGNOSIS — M1991 Primary osteoarthritis, unspecified site: Secondary | ICD-10-CM | POA: Diagnosis not present

## 2016-06-29 DIAGNOSIS — M1711 Unilateral primary osteoarthritis, right knee: Secondary | ICD-10-CM | POA: Diagnosis not present

## 2016-06-29 DIAGNOSIS — K219 Gastro-esophageal reflux disease without esophagitis: Secondary | ICD-10-CM | POA: Diagnosis not present

## 2016-06-29 DIAGNOSIS — Z96651 Presence of right artificial knee joint: Secondary | ICD-10-CM | POA: Diagnosis not present

## 2016-06-29 NOTE — Telephone Encounter (Signed)
[place] would like to let Dr. Sarajane Jews know the PT will start today being part of pts home health.

## 2016-06-30 DIAGNOSIS — K219 Gastro-esophageal reflux disease without esophagitis: Secondary | ICD-10-CM | POA: Diagnosis not present

## 2016-06-30 DIAGNOSIS — J4520 Mild intermittent asthma, uncomplicated: Secondary | ICD-10-CM | POA: Diagnosis not present

## 2016-06-30 DIAGNOSIS — Z471 Aftercare following joint replacement surgery: Secondary | ICD-10-CM | POA: Diagnosis not present

## 2016-06-30 DIAGNOSIS — M1991 Primary osteoarthritis, unspecified site: Secondary | ICD-10-CM | POA: Diagnosis not present

## 2016-06-30 DIAGNOSIS — Z7901 Long term (current) use of anticoagulants: Secondary | ICD-10-CM | POA: Diagnosis not present

## 2016-06-30 DIAGNOSIS — Z96651 Presence of right artificial knee joint: Secondary | ICD-10-CM | POA: Diagnosis not present

## 2016-06-30 DIAGNOSIS — Z9181 History of falling: Secondary | ICD-10-CM | POA: Diagnosis not present

## 2016-06-30 DIAGNOSIS — M8580 Other specified disorders of bone density and structure, unspecified site: Secondary | ICD-10-CM | POA: Diagnosis not present

## 2016-06-30 DIAGNOSIS — I10 Essential (primary) hypertension: Secondary | ICD-10-CM | POA: Diagnosis not present

## 2016-07-01 DIAGNOSIS — Z96651 Presence of right artificial knee joint: Secondary | ICD-10-CM | POA: Diagnosis not present

## 2016-07-01 DIAGNOSIS — M1991 Primary osteoarthritis, unspecified site: Secondary | ICD-10-CM | POA: Diagnosis not present

## 2016-07-01 DIAGNOSIS — M8580 Other specified disorders of bone density and structure, unspecified site: Secondary | ICD-10-CM | POA: Diagnosis not present

## 2016-07-01 DIAGNOSIS — Z7901 Long term (current) use of anticoagulants: Secondary | ICD-10-CM | POA: Diagnosis not present

## 2016-07-01 DIAGNOSIS — K219 Gastro-esophageal reflux disease without esophagitis: Secondary | ICD-10-CM | POA: Diagnosis not present

## 2016-07-01 DIAGNOSIS — Z9181 History of falling: Secondary | ICD-10-CM | POA: Diagnosis not present

## 2016-07-01 DIAGNOSIS — J4520 Mild intermittent asthma, uncomplicated: Secondary | ICD-10-CM | POA: Diagnosis not present

## 2016-07-01 DIAGNOSIS — I10 Essential (primary) hypertension: Secondary | ICD-10-CM | POA: Diagnosis not present

## 2016-07-01 DIAGNOSIS — Z471 Aftercare following joint replacement surgery: Secondary | ICD-10-CM | POA: Diagnosis not present

## 2016-07-02 DIAGNOSIS — J4520 Mild intermittent asthma, uncomplicated: Secondary | ICD-10-CM | POA: Diagnosis not present

## 2016-07-02 DIAGNOSIS — I10 Essential (primary) hypertension: Secondary | ICD-10-CM | POA: Diagnosis not present

## 2016-07-02 DIAGNOSIS — K219 Gastro-esophageal reflux disease without esophagitis: Secondary | ICD-10-CM | POA: Diagnosis not present

## 2016-07-02 DIAGNOSIS — M1991 Primary osteoarthritis, unspecified site: Secondary | ICD-10-CM | POA: Diagnosis not present

## 2016-07-02 DIAGNOSIS — Z471 Aftercare following joint replacement surgery: Secondary | ICD-10-CM | POA: Diagnosis not present

## 2016-07-02 DIAGNOSIS — M8580 Other specified disorders of bone density and structure, unspecified site: Secondary | ICD-10-CM | POA: Diagnosis not present

## 2016-07-02 DIAGNOSIS — Z7901 Long term (current) use of anticoagulants: Secondary | ICD-10-CM | POA: Diagnosis not present

## 2016-07-02 DIAGNOSIS — Z9181 History of falling: Secondary | ICD-10-CM | POA: Diagnosis not present

## 2016-07-02 DIAGNOSIS — Z96651 Presence of right artificial knee joint: Secondary | ICD-10-CM | POA: Diagnosis not present

## 2016-07-06 DIAGNOSIS — Z7901 Long term (current) use of anticoagulants: Secondary | ICD-10-CM | POA: Diagnosis not present

## 2016-07-06 DIAGNOSIS — Z9181 History of falling: Secondary | ICD-10-CM | POA: Diagnosis not present

## 2016-07-06 DIAGNOSIS — M8580 Other specified disorders of bone density and structure, unspecified site: Secondary | ICD-10-CM | POA: Diagnosis not present

## 2016-07-06 DIAGNOSIS — K219 Gastro-esophageal reflux disease without esophagitis: Secondary | ICD-10-CM | POA: Diagnosis not present

## 2016-07-06 DIAGNOSIS — I10 Essential (primary) hypertension: Secondary | ICD-10-CM | POA: Diagnosis not present

## 2016-07-06 DIAGNOSIS — M1991 Primary osteoarthritis, unspecified site: Secondary | ICD-10-CM | POA: Diagnosis not present

## 2016-07-06 DIAGNOSIS — Z471 Aftercare following joint replacement surgery: Secondary | ICD-10-CM | POA: Diagnosis not present

## 2016-07-06 DIAGNOSIS — Z96651 Presence of right artificial knee joint: Secondary | ICD-10-CM | POA: Diagnosis not present

## 2016-07-06 DIAGNOSIS — J4520 Mild intermittent asthma, uncomplicated: Secondary | ICD-10-CM | POA: Diagnosis not present

## 2016-07-08 DIAGNOSIS — Z471 Aftercare following joint replacement surgery: Secondary | ICD-10-CM | POA: Diagnosis not present

## 2016-07-08 DIAGNOSIS — K219 Gastro-esophageal reflux disease without esophagitis: Secondary | ICD-10-CM | POA: Diagnosis not present

## 2016-07-08 DIAGNOSIS — Z96651 Presence of right artificial knee joint: Secondary | ICD-10-CM | POA: Diagnosis not present

## 2016-07-08 DIAGNOSIS — Z9181 History of falling: Secondary | ICD-10-CM | POA: Diagnosis not present

## 2016-07-08 DIAGNOSIS — Z7901 Long term (current) use of anticoagulants: Secondary | ICD-10-CM | POA: Diagnosis not present

## 2016-07-08 DIAGNOSIS — I10 Essential (primary) hypertension: Secondary | ICD-10-CM | POA: Diagnosis not present

## 2016-07-08 DIAGNOSIS — M8580 Other specified disorders of bone density and structure, unspecified site: Secondary | ICD-10-CM | POA: Diagnosis not present

## 2016-07-08 DIAGNOSIS — M1991 Primary osteoarthritis, unspecified site: Secondary | ICD-10-CM | POA: Diagnosis not present

## 2016-07-08 DIAGNOSIS — J4520 Mild intermittent asthma, uncomplicated: Secondary | ICD-10-CM | POA: Diagnosis not present

## 2016-07-09 DIAGNOSIS — M8580 Other specified disorders of bone density and structure, unspecified site: Secondary | ICD-10-CM | POA: Diagnosis not present

## 2016-07-09 DIAGNOSIS — I10 Essential (primary) hypertension: Secondary | ICD-10-CM | POA: Diagnosis not present

## 2016-07-09 DIAGNOSIS — Z7901 Long term (current) use of anticoagulants: Secondary | ICD-10-CM | POA: Diagnosis not present

## 2016-07-09 DIAGNOSIS — K219 Gastro-esophageal reflux disease without esophagitis: Secondary | ICD-10-CM | POA: Diagnosis not present

## 2016-07-09 DIAGNOSIS — M1991 Primary osteoarthritis, unspecified site: Secondary | ICD-10-CM | POA: Diagnosis not present

## 2016-07-09 DIAGNOSIS — Z471 Aftercare following joint replacement surgery: Secondary | ICD-10-CM | POA: Diagnosis not present

## 2016-07-09 DIAGNOSIS — Z96651 Presence of right artificial knee joint: Secondary | ICD-10-CM | POA: Diagnosis not present

## 2016-07-09 DIAGNOSIS — Z9181 History of falling: Secondary | ICD-10-CM | POA: Diagnosis not present

## 2016-07-09 DIAGNOSIS — J4520 Mild intermittent asthma, uncomplicated: Secondary | ICD-10-CM | POA: Diagnosis not present

## 2016-07-13 DIAGNOSIS — Z471 Aftercare following joint replacement surgery: Secondary | ICD-10-CM | POA: Diagnosis not present

## 2016-07-13 DIAGNOSIS — M1991 Primary osteoarthritis, unspecified site: Secondary | ICD-10-CM | POA: Diagnosis not present

## 2016-07-13 DIAGNOSIS — Z96651 Presence of right artificial knee joint: Secondary | ICD-10-CM | POA: Diagnosis not present

## 2016-07-13 DIAGNOSIS — I10 Essential (primary) hypertension: Secondary | ICD-10-CM | POA: Diagnosis not present

## 2016-07-13 DIAGNOSIS — Z7901 Long term (current) use of anticoagulants: Secondary | ICD-10-CM | POA: Diagnosis not present

## 2016-07-13 DIAGNOSIS — K219 Gastro-esophageal reflux disease without esophagitis: Secondary | ICD-10-CM | POA: Diagnosis not present

## 2016-07-13 DIAGNOSIS — Z9181 History of falling: Secondary | ICD-10-CM | POA: Diagnosis not present

## 2016-07-13 DIAGNOSIS — J4520 Mild intermittent asthma, uncomplicated: Secondary | ICD-10-CM | POA: Diagnosis not present

## 2016-07-13 DIAGNOSIS — M8580 Other specified disorders of bone density and structure, unspecified site: Secondary | ICD-10-CM | POA: Diagnosis not present

## 2016-07-14 DIAGNOSIS — M1711 Unilateral primary osteoarthritis, right knee: Secondary | ICD-10-CM | POA: Diagnosis not present

## 2016-07-15 DIAGNOSIS — Z7901 Long term (current) use of anticoagulants: Secondary | ICD-10-CM | POA: Diagnosis not present

## 2016-07-15 DIAGNOSIS — M8580 Other specified disorders of bone density and structure, unspecified site: Secondary | ICD-10-CM | POA: Diagnosis not present

## 2016-07-15 DIAGNOSIS — Z9181 History of falling: Secondary | ICD-10-CM | POA: Diagnosis not present

## 2016-07-15 DIAGNOSIS — M1991 Primary osteoarthritis, unspecified site: Secondary | ICD-10-CM | POA: Diagnosis not present

## 2016-07-15 DIAGNOSIS — K219 Gastro-esophageal reflux disease without esophagitis: Secondary | ICD-10-CM | POA: Diagnosis not present

## 2016-07-15 DIAGNOSIS — Z96651 Presence of right artificial knee joint: Secondary | ICD-10-CM | POA: Diagnosis not present

## 2016-07-15 DIAGNOSIS — J4520 Mild intermittent asthma, uncomplicated: Secondary | ICD-10-CM | POA: Diagnosis not present

## 2016-07-15 DIAGNOSIS — Z471 Aftercare following joint replacement surgery: Secondary | ICD-10-CM | POA: Diagnosis not present

## 2016-07-15 DIAGNOSIS — I10 Essential (primary) hypertension: Secondary | ICD-10-CM | POA: Diagnosis not present

## 2016-07-16 DIAGNOSIS — I10 Essential (primary) hypertension: Secondary | ICD-10-CM | POA: Diagnosis not present

## 2016-07-16 DIAGNOSIS — M1991 Primary osteoarthritis, unspecified site: Secondary | ICD-10-CM | POA: Diagnosis not present

## 2016-07-16 DIAGNOSIS — K219 Gastro-esophageal reflux disease without esophagitis: Secondary | ICD-10-CM | POA: Diagnosis not present

## 2016-07-16 DIAGNOSIS — Z96651 Presence of right artificial knee joint: Secondary | ICD-10-CM | POA: Diagnosis not present

## 2016-07-16 DIAGNOSIS — Z471 Aftercare following joint replacement surgery: Secondary | ICD-10-CM | POA: Diagnosis not present

## 2016-07-16 DIAGNOSIS — J4520 Mild intermittent asthma, uncomplicated: Secondary | ICD-10-CM | POA: Diagnosis not present

## 2016-07-16 DIAGNOSIS — Z7901 Long term (current) use of anticoagulants: Secondary | ICD-10-CM | POA: Diagnosis not present

## 2016-07-16 DIAGNOSIS — M8580 Other specified disorders of bone density and structure, unspecified site: Secondary | ICD-10-CM | POA: Diagnosis not present

## 2016-07-16 DIAGNOSIS — Z9181 History of falling: Secondary | ICD-10-CM | POA: Diagnosis not present

## 2016-07-19 DIAGNOSIS — M25561 Pain in right knee: Secondary | ICD-10-CM | POA: Diagnosis not present

## 2016-07-19 DIAGNOSIS — G8929 Other chronic pain: Secondary | ICD-10-CM | POA: Diagnosis not present

## 2016-07-21 ENCOUNTER — Ambulatory Visit (INDEPENDENT_AMBULATORY_CARE_PROVIDER_SITE_OTHER): Payer: Medicare Other | Admitting: Family Medicine

## 2016-07-21 ENCOUNTER — Encounter: Payer: Self-pay | Admitting: Family Medicine

## 2016-07-21 VITALS — BP 126/77 | HR 78 | Temp 98.0°F | Ht 61.5 in | Wt 171.0 lb

## 2016-07-21 DIAGNOSIS — M1711 Unilateral primary osteoarthritis, right knee: Secondary | ICD-10-CM

## 2016-07-21 MED ORDER — TRAMADOL HCL 50 MG PO TABS: 100.0000 mg | ORAL_TABLET | Freq: Three times a day (TID) | ORAL | 2 refills | Status: DC | PRN

## 2016-07-21 NOTE — Progress Notes (Signed)
Pre visit review using our clinic review tool, if applicable. No additional management support is needed unless otherwise documented below in the visit note. 

## 2016-07-21 NOTE — Progress Notes (Signed)
   Subjective:    Patient ID: Aimee Lawrence, female    DOB: [birth year], 71 y.o.   MRN: [id number]  HPI Here to follow up on a right TKR on 04-15-16 per Dr. Rhona Raider. This went very well and she is walking around with a cane. She has been taking Oxycodone for pain but she wants to step down to Tramadol now. No other complaints.    Review of Systems  Constitutional: Negative.   Respiratory: Negative.   Cardiovascular: Negative.   Musculoskeletal: Positive for arthralgias.  Neurological: Negative.        Objective:   Physical Exam  Constitutional: She is oriented to person, place, and time. She appears well-developed and well-nourished.  Cardiovascular: Normal rate, regular rhythm, normal heart sounds and intact distal pulses.   Pulmonary/Chest: Effort normal and breath sounds normal.  Neurological: She is alert and oriented to person, place, and time.          Assessment & Plan:  She is doing well after a knee replacement. We wrote for her to switch to Tramadol for pain relief. Recheck prn.  Alysia Penna, MD

## 2016-07-22 DIAGNOSIS — M25561 Pain in right knee: Secondary | ICD-10-CM | POA: Diagnosis not present

## 2016-07-22 DIAGNOSIS — G8929 Other chronic pain: Secondary | ICD-10-CM | POA: Diagnosis not present

## 2016-08-02 DIAGNOSIS — M25561 Pain in right knee: Secondary | ICD-10-CM | POA: Diagnosis not present

## 2016-08-02 DIAGNOSIS — G8929 Other chronic pain: Secondary | ICD-10-CM | POA: Diagnosis not present

## 2016-08-04 DIAGNOSIS — M25561 Pain in right knee: Secondary | ICD-10-CM | POA: Diagnosis not present

## 2016-08-04 DIAGNOSIS — G8929 Other chronic pain: Secondary | ICD-10-CM | POA: Diagnosis not present

## 2016-08-06 DIAGNOSIS — M25561 Pain in right knee: Secondary | ICD-10-CM | POA: Diagnosis not present

## 2016-08-06 DIAGNOSIS — G8929 Other chronic pain: Secondary | ICD-10-CM | POA: Diagnosis not present

## 2016-08-06 DIAGNOSIS — M25661 Stiffness of right knee, not elsewhere classified: Secondary | ICD-10-CM | POA: Diagnosis not present

## 2016-08-09 DIAGNOSIS — M25561 Pain in right knee: Secondary | ICD-10-CM | POA: Diagnosis not present

## 2016-08-09 DIAGNOSIS — G8929 Other chronic pain: Secondary | ICD-10-CM | POA: Diagnosis not present

## 2016-08-11 DIAGNOSIS — M1711 Unilateral primary osteoarthritis, right knee: Secondary | ICD-10-CM | POA: Diagnosis not present

## 2016-08-11 DIAGNOSIS — Z96651 Presence of right artificial knee joint: Secondary | ICD-10-CM | POA: Diagnosis not present

## 2016-08-11 DIAGNOSIS — G8929 Other chronic pain: Secondary | ICD-10-CM | POA: Diagnosis not present

## 2016-08-11 DIAGNOSIS — M25561 Pain in right knee: Secondary | ICD-10-CM | POA: Diagnosis not present

## 2016-08-13 DIAGNOSIS — G8929 Other chronic pain: Secondary | ICD-10-CM | POA: Diagnosis not present

## 2016-08-13 DIAGNOSIS — M25561 Pain in right knee: Secondary | ICD-10-CM | POA: Diagnosis not present

## 2016-08-17 DIAGNOSIS — G8929 Other chronic pain: Secondary | ICD-10-CM | POA: Diagnosis not present

## 2016-08-17 DIAGNOSIS — M25561 Pain in right knee: Secondary | ICD-10-CM | POA: Diagnosis not present

## 2016-08-20 DIAGNOSIS — G8929 Other chronic pain: Secondary | ICD-10-CM | POA: Diagnosis not present

## 2016-08-20 DIAGNOSIS — M25561 Pain in right knee: Secondary | ICD-10-CM | POA: Diagnosis not present

## 2016-08-23 DIAGNOSIS — M25561 Pain in right knee: Secondary | ICD-10-CM | POA: Diagnosis not present

## 2016-08-23 DIAGNOSIS — G8929 Other chronic pain: Secondary | ICD-10-CM | POA: Diagnosis not present

## 2016-08-27 DIAGNOSIS — G8929 Other chronic pain: Secondary | ICD-10-CM | POA: Diagnosis not present

## 2016-08-27 DIAGNOSIS — M25561 Pain in right knee: Secondary | ICD-10-CM | POA: Diagnosis not present

## 2016-08-30 DIAGNOSIS — G8929 Other chronic pain: Secondary | ICD-10-CM | POA: Diagnosis not present

## 2016-08-30 DIAGNOSIS — M25561 Pain in right knee: Secondary | ICD-10-CM | POA: Diagnosis not present

## 2016-09-01 DIAGNOSIS — G8929 Other chronic pain: Secondary | ICD-10-CM | POA: Diagnosis not present

## 2016-09-01 DIAGNOSIS — M25561 Pain in right knee: Secondary | ICD-10-CM | POA: Diagnosis not present

## 2016-09-03 DIAGNOSIS — M25561 Pain in right knee: Secondary | ICD-10-CM | POA: Diagnosis not present

## 2016-09-03 DIAGNOSIS — G8929 Other chronic pain: Secondary | ICD-10-CM | POA: Diagnosis not present

## 2016-09-06 DIAGNOSIS — M25561 Pain in right knee: Secondary | ICD-10-CM | POA: Diagnosis not present

## 2016-09-06 DIAGNOSIS — G8929 Other chronic pain: Secondary | ICD-10-CM | POA: Diagnosis not present

## 2016-09-08 ENCOUNTER — Other Ambulatory Visit: Payer: Self-pay | Admitting: Orthopaedic Surgery

## 2016-09-08 DIAGNOSIS — Z96651 Presence of right artificial knee joint: Secondary | ICD-10-CM | POA: Diagnosis not present

## 2016-09-08 DIAGNOSIS — M25561 Pain in right knee: Secondary | ICD-10-CM | POA: Diagnosis not present

## 2016-09-08 DIAGNOSIS — G8929 Other chronic pain: Secondary | ICD-10-CM | POA: Diagnosis not present

## 2016-09-08 DIAGNOSIS — M1711 Unilateral primary osteoarthritis, right knee: Secondary | ICD-10-CM | POA: Diagnosis not present

## 2016-09-13 ENCOUNTER — Encounter (HOSPITAL_COMMUNITY): Payer: Self-pay

## 2016-09-13 ENCOUNTER — Encounter (HOSPITAL_COMMUNITY)
Admission: RE | Admit: 2016-09-13 | Discharge: 2016-09-13 | Disposition: A | Discharge status: Home / Self Care | Payer: Medicare Other | Source: Ambulatory Visit | Attending: Orthopaedic Surgery | Admitting: Orthopaedic Surgery

## 2016-09-13 DIAGNOSIS — Z881 Allergy status to other antibiotic agents status: Secondary | ICD-10-CM | POA: Diagnosis not present

## 2016-09-13 DIAGNOSIS — K219 Gastro-esophageal reflux disease without esophagitis: Secondary | ICD-10-CM | POA: Diagnosis not present

## 2016-09-13 DIAGNOSIS — Z86010 Personal history of colonic polyps: Secondary | ICD-10-CM | POA: Diagnosis not present

## 2016-09-13 DIAGNOSIS — Z803 Family history of malignant neoplasm of breast: Secondary | ICD-10-CM | POA: Diagnosis not present

## 2016-09-13 DIAGNOSIS — Z8249 Family history of ischemic heart disease and other diseases of the circulatory system: Secondary | ICD-10-CM | POA: Diagnosis not present

## 2016-09-13 DIAGNOSIS — Z9049 Acquired absence of other specified parts of digestive tract: Secondary | ICD-10-CM | POA: Diagnosis not present

## 2016-09-13 DIAGNOSIS — M1990 Unspecified osteoarthritis, unspecified site: Secondary | ICD-10-CM | POA: Diagnosis not present

## 2016-09-13 DIAGNOSIS — Z880 Allergy status to penicillin: Secondary | ICD-10-CM | POA: Diagnosis not present

## 2016-09-13 DIAGNOSIS — X58XXXA Exposure to other specified factors, initial encounter: Secondary | ICD-10-CM | POA: Diagnosis not present

## 2016-09-13 DIAGNOSIS — Z8619 Personal history of other infectious and parasitic diseases: Secondary | ICD-10-CM | POA: Diagnosis not present

## 2016-09-13 DIAGNOSIS — Z886 Allergy status to analgesic agent status: Secondary | ICD-10-CM | POA: Diagnosis not present

## 2016-09-13 DIAGNOSIS — Z811 Family history of alcohol abuse and dependence: Secondary | ICD-10-CM | POA: Diagnosis not present

## 2016-09-13 DIAGNOSIS — M8580 Other specified disorders of bone density and structure, unspecified site: Secondary | ICD-10-CM | POA: Diagnosis not present

## 2016-09-13 DIAGNOSIS — M25661 Stiffness of right knee, not elsewhere classified: Secondary | ICD-10-CM | POA: Diagnosis not present

## 2016-09-13 DIAGNOSIS — Z96651 Presence of right artificial knee joint: Secondary | ICD-10-CM | POA: Diagnosis present

## 2016-09-13 DIAGNOSIS — T8489XA Other specified complication of internal orthopedic prosthetic devices, implants and grafts, initial encounter: Secondary | ICD-10-CM | POA: Diagnosis not present

## 2016-09-13 DIAGNOSIS — J45909 Unspecified asthma, uncomplicated: Secondary | ICD-10-CM | POA: Diagnosis not present

## 2016-09-13 DIAGNOSIS — Z8261 Family history of arthritis: Secondary | ICD-10-CM | POA: Diagnosis not present

## 2016-09-13 LAB — CBC
HEMATOCRIT: 40.2 % (ref 36.0–46.0)
HEMOGLOBIN: 13.1 g/dL (ref 12.0–15.0)
MCH: 29.6 pg (ref 26.0–34.0)
MCHC: 32.6 g/dL (ref 30.0–36.0)
MCV: 90.7 fL (ref 78.0–100.0)
Platelets: 271 10*3/uL (ref 150–400)
RBC: 4.43 MIL/uL (ref 3.87–5.11)
RDW: 14.2 % (ref 11.5–15.5)
WBC: 7.2 10*3/uL (ref 4.0–10.5)

## 2016-09-13 LAB — SURGICAL PCR SCREEN
MRSA, PCR: NEGATIVE
Staphylococcus aureus: NEGATIVE

## 2016-09-13 MED ORDER — CHLORHEXIDINE GLUCONATE 4 % EX LIQD: 60.0000 mL | Freq: Once | CUTANEOUS | Status: DC

## 2016-09-13 MED ORDER — VANCOMYCIN HCL 10 G IV SOLR
1250.0000 mg | INTRAVENOUS | Status: DC
  Filled 2016-09-13 (×2): qty 1250

## 2016-09-13 NOTE — H&P (Signed)
Aimee Lawrence is an 71 y.o. female.   Chief Complaint: Left knee pain and stiffness HPI: Eola Boudoin continues to struggle with some stiffness about her replaced right knee.  She is about 2-1/2 months out.  She is walking without appliances.  She's doing her therapy at [facility].  She is on Percocet maybe once a day at this point.    Radiographs:  X-rays that were ordered, performed, and interpreted by me today included 2 views of the right knee.  These showed good placement of her Attune components.  Past Medical History:  Diagnosis Date  . Allergy    takes Doxycycline daily and uses Flonase daily  . Arthritis   . Asthma    Albuterol inhaler as needed.  Marland Kitchen Dyspnea    rarely and sitting  . GERD (gastroesophageal reflux disease)    not on any meds  . History of bronchitis   . History of shingles   . Hx of colonic polyps    benign  . Joint pain   . Joint swelling   . Osteopenia    takes Vit D3 and Calcium daily  . Primary localized osteoarthritis of right knee     Past Surgical History:  Procedure Laterality Date  . CHOLECYSTECTOMY  2007   lap choli  . COLONOSCOPY  multiple since 2003   per Dr. Carlean Purl, adenomatous polyps, recall 2022  . NASAL POLYP SURGERY  2007  . SINUS ENDO W/FUSION  06/27/2012   Procedure: ENDOSCOPIC SINUS SURGERY WITH FUSION NAVIGATION;  Surgeon: Rozetta Nunnery, MD;  Location: [facility];  Service: ENT;  Laterality: N/A;  FESS FUSION PROTOCOL  . TOTAL KNEE ARTHROPLASTY Right 06/15/2016   Procedure: RIGHT TOTAL KNEE ARTHROPLASTY;  Surgeon: Melrose Nakayama, MD;  Location: [facility];  Service: Orthopedics;  Laterality: Right;    Family History  Problem Relation Age of Onset  . Alcohol abuse    . Arthritis    . Diabetes    . Heart disease    . Breast cancer Mother   . Pneumonia Mother   . Colon cancer Neg Hx   . Stomach cancer Neg Hx    Social History:  reports that she has never smoked. She has never used smokeless tobacco. She  reports that she does not drink alcohol or use drugs.  Allergies:  Allergies  Allergen Reactions  . Aleve [Naproxen Sodium] Shortness Of Breath  . Aspirin Shortness Of Breath    Trouble breathing  . Ibuprofen Shortness Of Breath    Short of breath  . Ampicillin Rash     Has patient had a PCN reaction causing immediate rash, facial/tongue/throat swelling, SOB or lightheadedness with hypotension:   # # UNKNOWN # #  Has patient had a PCN reaction that required hospitalization    # # NO # #  Has patient had a PCN reaction occurring within the last 10 years:    # # NO # #  If all of the above answers are "NO", then may proceed with Cephalosporin use.   . Erythromycin Nausea And Vomiting    No prescriptions prior to admission.    Results for orders placed or performed during the hospital encounter of 09/13/16 (from the past 48 hour(s))  CBC     Status: None   Collection Time: 09/13/16  1:06 PM  Result Value Ref Range   WBC 7.2 4.0 - 10.5 K/uL   RBC 4.43 3.87 - 5.11 MIL/uL   Hemoglobin 13.1 12.0 -  15.0 g/dL   HCT 40.2 36.0 - 46.0 %   MCV 90.7 78.0 - 100.0 fL   MCH 29.6 26.0 - 34.0 pg   MCHC 32.6 30.0 - 36.0 g/dL   RDW 14.2 11.5 - 15.5 %   Platelets 271 150 - 400 K/uL   No results found.  Review of Systems  Musculoskeletal: Positive for joint pain.       Right knee  All other systems reviewed and are negative.   Weight 77.6 kg (171 lb). Physical Exam  Constitutional: She is oriented to person, place, and time. She appears well-developed and well-nourished.  HENT:  Head: Normocephalic and atraumatic.  Eyes: Pupils are equal, round, and reactive to light.  Neck: Normal range of motion.  Cardiovascular: Normal rate and regular rhythm.   Respiratory: Effort normal.  GI: Soft.  Musculoskeletal:  Right knee motion is if anything a bit worse from maybe 10-80. Knee is stable to varus and valgus stressing  Neurological: She is alert and oriented to person, place, and time.   Skin: Skin is warm and dry.  Psychiatric: She has a normal mood and affect. Her behavior is normal. Judgment and thought content normal.     Assessment/Plan Assessment:  Status post right TKR 06/15/16 with continued stiffness  Plan: Anteria Faulk is interested in a manipulation.  I reviewed risks of anesthesia and fracture.  We will  go forward and then get her back in therapy.  Martiza Speth, Larwance Sachs, PA-C 09/13/2016, 3:03 PM

## 2016-09-13 NOTE — Pre-Procedure Instructions (Addendum)
    ALBANY KOSAR  09/13/2016      [facility] [address], [place] [address] [place] [address] Phone: [phone] Fax: [phone]    Your procedure is scheduled on Tues. Mar. 6 @ 135 PM.  Report to [facility] at 1135 AM.  Call this number if you have problems the morning of surgery:  [phone]   Remember:  Do not eat food or drink liquids after midnight.  Take these medicines the morning of surgery with A SIP OF WATER acetaminophen (tylenol), Pain pill (if needed), fluticasone (flonase) nasal spray, albuterol (proventil HFA; ventolin HFA) inhaler, promethazine (phenergan) if needed.  Stop taking any herbal medication, vitamins, ibuprofen, Aleve, Advil, Goody's, BC's now.      Do not wear jewelry, make-up or nail polish.  Do not wear lotions, powders, or perfumes, or deoderant.  Do not shave 48 hours prior to surgery.  Men may shave face and neck.  Do not bring valuables to the hospital.  [facility] is not responsible for any belongings or valuables.  Contacts, dentures or bridgework may not be worn into surgery.  Leave your suitcase in the car.  After surgery it may be brought to your room.  For patients admitted to the hospital, discharge time will be determined by your treatment team.  Patients discharged the day of surgery will not be allowed to drive home.    Special instructions: see attached  Please read over the following fact sheets that you were given. Pain Booklet, Coughing and Deep Breathing, MRSA Information and Surgical Site Infection Prevention

## 2016-09-13 NOTE — Progress Notes (Signed)
PCP: Dr. Alysia Penna  Cardiologist: pt denies  EKG: 05/2016 in [facility]  Stress test: pt denies ever  ECHO: pt denies ever  Cardiac cath: pt denies ever  Chest x-ray: 05/2016 in [facility]

## 2016-09-14 ENCOUNTER — Encounter (HOSPITAL_COMMUNITY)
Admission: RE | Disposition: A | Discharge status: Home / Self Care | Payer: Self-pay | Source: Ambulatory Visit | Attending: Orthopaedic Surgery

## 2016-09-14 ENCOUNTER — Ambulatory Visit (HOSPITAL_COMMUNITY): Payer: Medicare Other | Admitting: Anesthesiology

## 2016-09-14 ENCOUNTER — Encounter (HOSPITAL_COMMUNITY): Payer: Self-pay | Admitting: *Deleted

## 2016-09-14 ENCOUNTER — Ambulatory Visit (HOSPITAL_COMMUNITY)
Admission: RE | Admit: 2016-09-14 | Discharge: 2016-09-14 | Disposition: A | Discharge status: Home / Self Care | Payer: Medicare Other | Source: Ambulatory Visit | Attending: Orthopaedic Surgery | Admitting: Orthopaedic Surgery

## 2016-09-14 DIAGNOSIS — M25661 Stiffness of right knee, not elsewhere classified: Secondary | ICD-10-CM | POA: Diagnosis not present

## 2016-09-14 DIAGNOSIS — Z9049 Acquired absence of other specified parts of digestive tract: Secondary | ICD-10-CM | POA: Diagnosis not present

## 2016-09-14 DIAGNOSIS — J45909 Unspecified asthma, uncomplicated: Secondary | ICD-10-CM | POA: Insufficient documentation

## 2016-09-14 DIAGNOSIS — M1990 Unspecified osteoarthritis, unspecified site: Secondary | ICD-10-CM | POA: Insufficient documentation

## 2016-09-14 DIAGNOSIS — Z86010 Personal history of colonic polyps: Secondary | ICD-10-CM | POA: Insufficient documentation

## 2016-09-14 DIAGNOSIS — M24661 Ankylosis, right knee: Secondary | ICD-10-CM | POA: Diagnosis not present

## 2016-09-14 DIAGNOSIS — M8580 Other specified disorders of bone density and structure, unspecified site: Secondary | ICD-10-CM | POA: Diagnosis not present

## 2016-09-14 DIAGNOSIS — Z803 Family history of malignant neoplasm of breast: Secondary | ICD-10-CM | POA: Insufficient documentation

## 2016-09-14 DIAGNOSIS — Z886 Allergy status to analgesic agent status: Secondary | ICD-10-CM | POA: Insufficient documentation

## 2016-09-14 DIAGNOSIS — T84092A Other mechanical complication of internal right knee prosthesis, initial encounter: Secondary | ICD-10-CM | POA: Diagnosis not present

## 2016-09-14 DIAGNOSIS — Z811 Family history of alcohol abuse and dependence: Secondary | ICD-10-CM | POA: Insufficient documentation

## 2016-09-14 DIAGNOSIS — T8489XA Other specified complication of internal orthopedic prosthetic devices, implants and grafts, initial encounter: Secondary | ICD-10-CM | POA: Insufficient documentation

## 2016-09-14 DIAGNOSIS — Z881 Allergy status to other antibiotic agents status: Secondary | ICD-10-CM | POA: Insufficient documentation

## 2016-09-14 DIAGNOSIS — K219 Gastro-esophageal reflux disease without esophagitis: Secondary | ICD-10-CM | POA: Diagnosis not present

## 2016-09-14 DIAGNOSIS — Z880 Allergy status to penicillin: Secondary | ICD-10-CM | POA: Insufficient documentation

## 2016-09-14 DIAGNOSIS — Z8249 Family history of ischemic heart disease and other diseases of the circulatory system: Secondary | ICD-10-CM | POA: Diagnosis not present

## 2016-09-14 DIAGNOSIS — Z8619 Personal history of other infectious and parasitic diseases: Secondary | ICD-10-CM | POA: Diagnosis not present

## 2016-09-14 DIAGNOSIS — Z8261 Family history of arthritis: Secondary | ICD-10-CM | POA: Insufficient documentation

## 2016-09-14 DIAGNOSIS — I10 Essential (primary) hypertension: Secondary | ICD-10-CM | POA: Diagnosis not present

## 2016-09-14 DIAGNOSIS — X58XXXA Exposure to other specified factors, initial encounter: Secondary | ICD-10-CM | POA: Insufficient documentation

## 2016-09-14 HISTORY — PX: KNEE CLOSED REDUCTION: SHX995

## 2016-09-14 SURGERY — MANIPULATION, KNEE, CLOSED
Anesthesia: General | Laterality: Right

## 2016-09-14 MED ORDER — PROMETHAZINE HCL 25 MG/ML IJ SOLN: 6.2500 mg | INTRAMUSCULAR | Status: DC | PRN

## 2016-09-14 MED ORDER — HYDROMORPHONE HCL 1 MG/ML IJ SOLN
INTRAMUSCULAR | Status: AC
  Filled 2016-09-14: qty 1

## 2016-09-14 MED ORDER — PROPOFOL 10 MG/ML IV BOLUS
INTRAVENOUS | Status: AC
  Filled 2016-09-14: qty 20

## 2016-09-14 MED ORDER — BUPIVACAINE-EPINEPHRINE 0.5% -1:200000 IJ SOLN
INTRAMUSCULAR | Status: DC | PRN
  Administered 2016-09-14: 7 mL

## 2016-09-14 MED ORDER — PROPOFOL 10 MG/ML IV BOLUS
INTRAVENOUS | Status: DC | PRN
  Administered 2016-09-14: 40 mg via INTRAVENOUS
  Administered 2016-09-14: 60 mg via INTRAVENOUS

## 2016-09-14 MED ORDER — HYDROMORPHONE HCL 1 MG/ML IJ SOLN
0.2500 mg | INTRAMUSCULAR | Status: DC | PRN
  Administered 2016-09-14 (×2): 0.5 mg via INTRAVENOUS

## 2016-09-14 MED ORDER — BUPIVACAINE-EPINEPHRINE (PF) 0.5% -1:200000 IJ SOLN
INTRAMUSCULAR | Status: AC
  Filled 2016-09-14: qty 30

## 2016-09-14 MED ORDER — FENTANYL CITRATE (PF) 100 MCG/2ML IJ SOLN
INTRAMUSCULAR | Status: AC
  Filled 2016-09-14: qty 2

## 2016-09-14 MED ORDER — LIDOCAINE HCL 1 % IJ SOLN
INTRAMUSCULAR | Status: DC | PRN
  Administered 2016-09-14: 3 mL

## 2016-09-14 MED ORDER — LIDOCAINE HCL (PF) 1 % IJ SOLN
INTRAMUSCULAR | Status: AC
  Filled 2016-09-14: qty 30

## 2016-09-14 MED ORDER — LACTATED RINGERS IV SOLN
INTRAVENOUS | Status: DC
  Administered 2016-09-14: 12:00:00 via INTRAVENOUS

## 2016-09-14 MED ORDER — FENTANYL CITRATE (PF) 100 MCG/2ML IJ SOLN
INTRAMUSCULAR | Status: DC | PRN
  Administered 2016-09-14 (×2): 50 ug via INTRAVENOUS

## 2016-09-14 SURGICAL SUPPLY — 1 items: DURAPREP 6ML APPLICATOR 50/CS (WOUND CARE) ×1 IMPLANT

## 2016-09-14 NOTE — Anesthesia Postprocedure Evaluation (Addendum)
Anesthesia Post Note  Patient: Aimee Lawrence  Procedure(s) Performed: Procedure(s) (LRB): CLOSED MANIPULATION KNEE (Right)  Patient location during evaluation: PACU Anesthesia Type: General Level of consciousness: awake and alert Pain management: pain level controlled Vital Signs Assessment: post-procedure vital signs reviewed and stable Respiratory status: spontaneous breathing, nonlabored ventilation, respiratory function stable and patient connected to nasal cannula oxygen Cardiovascular status: stable and blood pressure returned to baseline Anesthetic complications: no       Last Vitals:  Vitals:   09/14/16 1430 09/14/16 1445  BP: 118/70 119/81  Pulse: 64 73  Resp: 12 13  Temp:  36.5 C    Last Pain:  Vitals:   09/14/16 1445  TempSrc:   PainSc: 3                  Rorey Bisson DANIEL

## 2016-09-14 NOTE — Transfer of Care (Signed)
Immediate Anesthesia Transfer of Care Note  Patient: [name]  Procedure(s) Performed: Procedure(s): CLOSED MANIPULATION KNEE (Right)  Patient Location: PACU  Anesthesia Type:MAC  Level of Consciousness: awake, alert , oriented and patient cooperative  Airway & Oxygen Therapy: Patient Spontanous Breathing and Patient connected to face mask oxygen  Post-op Assessment: Report given to RN and Post -op Vital signs reviewed and stable  Post vital signs: Reviewed and stable  Last Vitals:  Vitals:   09/14/16 1142 09/14/16 1415  BP: 127/73 122/62  Pulse: 82 72  Resp: 18 16  Temp: 36.9 C 36.8 C    Last Pain:  Vitals:   09/14/16 1153  TempSrc:   PainSc: 2       Patients Stated Pain Goal: 3 (77/11/65 7903)  Complications: No apparent anesthesia complications

## 2016-09-14 NOTE — Op Note (Signed)
#  [id number] 

## 2016-09-14 NOTE — Anesthesia Preprocedure Evaluation (Addendum)
Anesthesia Evaluation  Patient identified by MRN, date of birth, ID band Patient awake    Reviewed: Allergy & Precautions, NPO status , Patient's Chart, lab work & pertinent test results  Airway Mallampati: II  TM Distance: >3 FB Neck ROM: Full    Dental   Pulmonary asthma ,    breath sounds clear to auscultation       Cardiovascular  Rhythm:Regular Rate:Normal     Neuro/Psych PSYCHIATRIC DISORDERS Depression negative neurological ROS     GI/Hepatic Neg liver ROS, GERD  ,  Endo/Other  negative endocrine ROS  Renal/GU negative Renal ROS     Musculoskeletal   Abdominal   Peds  Hematology negative hematology ROS (+)   Anesthesia Other Findings   Reproductive/Obstetrics                             Lab Results  Component Value Date   WBC 7.2 09/13/2016   HGB 13.1 09/13/2016   HCT 40.2 09/13/2016   MCV 90.7 09/13/2016   PLT 271 09/13/2016   Lab Results  Component Value Date   CREATININE 0.7 06/22/2016   BUN 15 06/22/2016   NA 141 06/22/2016   K 4.2 06/22/2016   CL 102 06/04/2016   CO2 30 06/04/2016   Lab Results  Component Value Date   INR 0.99 06/04/2016    Anesthesia Physical  Anesthesia Plan  ASA: II  Anesthesia Plan: MAC   Post-op Pain Management:  Regional for Post-op pain   Induction: Intravenous  Airway Management Planned: Natural Airway and Simple Face Mask  Additional Equipment:   Intra-op Plan:   Post-operative Plan:   Informed Consent: I have reviewed the patients History and Physical, chart, labs and discussed the procedure including the risks, benefits and alternatives for the proposed anesthesia with the patient or authorized representative who has indicated his/her understanding and acceptance.   Dental advisory given  Plan Discussed with: CRNA  Anesthesia Plan Comments:        Anesthesia Quick Evaluation

## 2016-09-14 NOTE — Interval H&P Note (Signed)
History and Physical Interval Note:  09/14/2016 12:57 PM  Aimee Lawrence  has presented today for surgery, with the diagnosis of STIFF RIGHT TOTAL KNEE  The various methods of treatment have been discussed with the patient and family. After consideration of risks, benefits and other options for treatment, the patient has consented to  Procedure(s): CLOSED MANIPULATION KNEE (Right) as a surgical intervention .  The patient's history has been reviewed, patient examined, no change in status, stable for surgery.  I have reviewed the patient's chart and labs.  Questions were answered to the patient's satisfaction.     Arabelle Bollig G

## 2016-09-15 ENCOUNTER — Encounter (HOSPITAL_COMMUNITY): Payer: Self-pay | Admitting: Orthopaedic Surgery

## 2016-09-15 DIAGNOSIS — M25661 Stiffness of right knee, not elsewhere classified: Secondary | ICD-10-CM | POA: Diagnosis not present

## 2016-09-15 DIAGNOSIS — Z96651 Presence of right artificial knee joint: Secondary | ICD-10-CM | POA: Diagnosis not present

## 2016-09-15 NOTE — Op Note (Signed)
NAMEMATISON, NUCCIO                   ACCOUNT NO.:  [id number]  MEDICAL RECORD NO.:  [id number]  LOCATION:                                 FACILITY:  PHYSICIAN:  Monico Blitz. Rhona Raider, M.D.     DATE OF BIRTH:  DATE OF PROCEDURE:  09/14/2016 DATE OF DISCHARGE:                              OPERATIVE REPORT   PREOPERATIVE DIAGNOSIS:  Stiff right total knee replacement.  POSTOPERATIVE DIAGNOSIS:  Stiff right total knee replacement.  PROCEDURE:  Closed manipulation, right knee.  ANESTHESIA:  General.  ATTENDING SURGEON:  Monico Blitz. Rhona Raider, M.D.  ASSISTANT:  Loni Dolly, P.A.  INDICATION FOR PROCEDURE:  The patient is a 71 year old woman about 3 months from a knee replacement.  Despite trying hard in physical therapy, her motion is a bit limited at about 10 degrees to 80 degrees. This has not changed in the past month and she is offered a manipulation.  Informed operative consent was obtained after discussion of the possible complications including reaction to anesthesia and fracture.  SUMMARY OF FINDINGS AND PROCEDURE:  Under general anesthesia, a right knee manipulation was performed.  __________ her motion was about 10 degrees to 80 degrees, and with some audible pops, we improved this to about 5 degrees to 110 degrees.  We then sterilely prepped and injected with some local anesthetic.  A Band-Aid was applied.  Estimated blood loss and fluids obtained from anesthesia records.  DISPOSITION:  The patient was extubated in the operating room and taken to recovery room in stable condition.  Plans were for her to go home same day and follow up in the office __________.  She has a physical therapy appointment scheduled for tomorrow.     Monico Blitz Rhona Raider, M.D.   ______________________________ Monico Blitz. Rhona Raider, M.D.   PGD/MEDQ  D:  09/14/2016  T:  09/14/2016  Job:  [id number]

## 2016-09-15 NOTE — Addendum Note (Signed)
Addendum  created 09/15/16 1443 by Duane Boston, MD   Anesthesia Attestations deleted, Anesthesia Attestations filed

## 2016-09-16 DIAGNOSIS — Z96651 Presence of right artificial knee joint: Secondary | ICD-10-CM | POA: Diagnosis not present

## 2016-09-16 DIAGNOSIS — M25661 Stiffness of right knee, not elsewhere classified: Secondary | ICD-10-CM | POA: Diagnosis not present

## 2016-09-17 DIAGNOSIS — Z96651 Presence of right artificial knee joint: Secondary | ICD-10-CM | POA: Diagnosis not present

## 2016-09-17 DIAGNOSIS — M25661 Stiffness of right knee, not elsewhere classified: Secondary | ICD-10-CM | POA: Diagnosis not present

## 2016-09-20 DIAGNOSIS — Z96651 Presence of right artificial knee joint: Secondary | ICD-10-CM | POA: Diagnosis not present

## 2016-09-20 DIAGNOSIS — M25661 Stiffness of right knee, not elsewhere classified: Secondary | ICD-10-CM | POA: Diagnosis not present

## 2016-09-22 DIAGNOSIS — Z96651 Presence of right artificial knee joint: Secondary | ICD-10-CM | POA: Diagnosis not present

## 2016-09-22 DIAGNOSIS — M25661 Stiffness of right knee, not elsewhere classified: Secondary | ICD-10-CM | POA: Diagnosis not present

## 2016-09-24 DIAGNOSIS — M25661 Stiffness of right knee, not elsewhere classified: Secondary | ICD-10-CM | POA: Diagnosis not present

## 2016-09-24 DIAGNOSIS — Z96651 Presence of right artificial knee joint: Secondary | ICD-10-CM | POA: Diagnosis not present

## 2016-09-27 DIAGNOSIS — Z96651 Presence of right artificial knee joint: Secondary | ICD-10-CM | POA: Diagnosis not present

## 2016-09-27 DIAGNOSIS — M25661 Stiffness of right knee, not elsewhere classified: Secondary | ICD-10-CM | POA: Diagnosis not present

## 2016-09-29 DIAGNOSIS — M25661 Stiffness of right knee, not elsewhere classified: Secondary | ICD-10-CM | POA: Diagnosis not present

## 2016-09-29 DIAGNOSIS — Z96651 Presence of right artificial knee joint: Secondary | ICD-10-CM | POA: Diagnosis not present

## 2016-10-01 DIAGNOSIS — M25661 Stiffness of right knee, not elsewhere classified: Secondary | ICD-10-CM | POA: Diagnosis not present

## 2016-10-01 DIAGNOSIS — Z96651 Presence of right artificial knee joint: Secondary | ICD-10-CM | POA: Diagnosis not present

## 2016-10-04 DIAGNOSIS — M25661 Stiffness of right knee, not elsewhere classified: Secondary | ICD-10-CM | POA: Diagnosis not present

## 2016-10-04 DIAGNOSIS — Z96651 Presence of right artificial knee joint: Secondary | ICD-10-CM | POA: Diagnosis not present

## 2016-10-06 DIAGNOSIS — Z96651 Presence of right artificial knee joint: Secondary | ICD-10-CM | POA: Diagnosis not present

## 2016-10-06 DIAGNOSIS — M25661 Stiffness of right knee, not elsewhere classified: Secondary | ICD-10-CM | POA: Diagnosis not present

## 2016-10-12 ENCOUNTER — Encounter: Payer: Self-pay | Admitting: Family Medicine

## 2016-10-12 ENCOUNTER — Ambulatory Visit (INDEPENDENT_AMBULATORY_CARE_PROVIDER_SITE_OTHER): Payer: Medicare Other | Admitting: Family Medicine

## 2016-10-12 VITALS — BP 138/83 | HR 84 | Temp 98.2°F | Ht 61.5 in | Wt 167.0 lb

## 2016-10-12 DIAGNOSIS — J018 Other acute sinusitis: Secondary | ICD-10-CM

## 2016-10-12 MED ORDER — AZITHROMYCIN 250 MG PO TABS: ORAL_TABLET | ORAL | 0 refills | Status: DC

## 2016-10-12 NOTE — Patient Instructions (Signed)
WE NOW OFFER   [facility] FAST TRACK!!!  SAME DAY Appointments for ACUTE CARE  Such as: Sprains, Injuries, cuts, abrasions, rashes, muscle pain, joint pain, back pain Colds, flu, sore throats, headache, allergies, cough, fever  Ear pain, sinus and eye infections Abdominal pain, nausea, vomiting, diarrhea, upset stomach Animal/insect bites  3 Easy Ways to Schedule: Walk-In Scheduling Call in scheduling Mychart Sign-up: [email or web address]         

## 2016-10-12 NOTE — Progress Notes (Signed)
Pre visit review using our clinic review tool, if applicable. No additional management support is needed unless otherwise documented below in the visit note. 

## 2016-10-12 NOTE — Progress Notes (Signed)
   Subjective:    Patient ID: Aimee Lawrence, female    DOB: 1946-05-30, 71 y.o.   MRN: [id number]  HPI Here for 4 days of sinus pressure, PND, and coughing up green sputum. No fever.    Review of Systems  Constitutional: Negative.   HENT: Positive for congestion, sinus pain and sinus pressure. Negative for sore throat.   Eyes: Negative.   Respiratory: Positive for cough.        Objective:   Physical Exam  Constitutional: She appears well-developed and well-nourished.  HENT:  Right Ear: External ear normal.  Left Ear: External ear normal.  Nose: Nose normal.  Mouth/Throat: Oropharynx is clear and moist.  Eyes: Conjunctivae are normal.  Neck: No thyromegaly present.  Pulmonary/Chest: Effort normal and breath sounds normal. No respiratory distress. She has no wheezes. She has no rales.  Lymphadenopathy:    She has no cervical adenopathy.          Assessment & Plan:  Sinusitis, treat with a Zpack.  Alysia Penna, MD

## 2016-10-13 DIAGNOSIS — M25661 Stiffness of right knee, not elsewhere classified: Secondary | ICD-10-CM | POA: Diagnosis not present

## 2016-10-13 DIAGNOSIS — Z96651 Presence of right artificial knee joint: Secondary | ICD-10-CM | POA: Diagnosis not present

## 2016-10-15 DIAGNOSIS — M25661 Stiffness of right knee, not elsewhere classified: Secondary | ICD-10-CM | POA: Diagnosis not present

## 2016-10-20 DIAGNOSIS — Z96651 Presence of right artificial knee joint: Secondary | ICD-10-CM | POA: Diagnosis not present

## 2016-10-20 DIAGNOSIS — M25661 Stiffness of right knee, not elsewhere classified: Secondary | ICD-10-CM | POA: Diagnosis not present

## 2016-10-22 DIAGNOSIS — Z96651 Presence of right artificial knee joint: Secondary | ICD-10-CM | POA: Diagnosis not present

## 2016-10-22 DIAGNOSIS — M25661 Stiffness of right knee, not elsewhere classified: Secondary | ICD-10-CM | POA: Diagnosis not present

## 2016-10-25 DIAGNOSIS — Z96651 Presence of right artificial knee joint: Secondary | ICD-10-CM | POA: Diagnosis not present

## 2016-10-25 DIAGNOSIS — M25661 Stiffness of right knee, not elsewhere classified: Secondary | ICD-10-CM | POA: Diagnosis not present

## 2016-10-27 DIAGNOSIS — M25661 Stiffness of right knee, not elsewhere classified: Secondary | ICD-10-CM | POA: Diagnosis not present

## 2016-10-27 DIAGNOSIS — Z96651 Presence of right artificial knee joint: Secondary | ICD-10-CM | POA: Diagnosis not present

## 2016-11-01 DIAGNOSIS — Z96651 Presence of right artificial knee joint: Secondary | ICD-10-CM | POA: Diagnosis not present

## 2016-11-01 DIAGNOSIS — M25661 Stiffness of right knee, not elsewhere classified: Secondary | ICD-10-CM | POA: Diagnosis not present

## 2016-11-03 DIAGNOSIS — M25661 Stiffness of right knee, not elsewhere classified: Secondary | ICD-10-CM | POA: Diagnosis not present

## 2016-11-03 DIAGNOSIS — Z96651 Presence of right artificial knee joint: Secondary | ICD-10-CM | POA: Diagnosis not present

## 2016-11-09 DIAGNOSIS — M25661 Stiffness of right knee, not elsewhere classified: Secondary | ICD-10-CM | POA: Diagnosis not present

## 2016-11-09 DIAGNOSIS — Z96651 Presence of right artificial knee joint: Secondary | ICD-10-CM | POA: Diagnosis not present

## 2016-11-11 DIAGNOSIS — Z96651 Presence of right artificial knee joint: Secondary | ICD-10-CM | POA: Diagnosis not present

## 2016-11-11 DIAGNOSIS — M25661 Stiffness of right knee, not elsewhere classified: Secondary | ICD-10-CM | POA: Diagnosis not present

## 2016-11-17 ENCOUNTER — Telehealth: Payer: Self-pay | Admitting: Family Medicine

## 2016-11-17 MED ORDER — TRAMADOL HCL 50 MG PO TABS: 100.0000 mg | ORAL_TABLET | Freq: Three times a day (TID) | ORAL | 5 refills | Status: DC | PRN

## 2016-11-17 NOTE — Telephone Encounter (Signed)
Call in #[phone] with 5 rf 

## 2016-11-17 NOTE — Telephone Encounter (Signed)
° ° °  Pt request refill of the following:  traMADol (ULTRAM) 50 MG tablet   Phamacy: [facility]

## 2016-11-17 NOTE — Telephone Encounter (Signed)
I called in script to [facility].

## 2016-12-02 DIAGNOSIS — H43812 Vitreous degeneration, left eye: Secondary | ICD-10-CM | POA: Diagnosis not present

## 2016-12-11 NOTE — Addendum Note (Signed)
Addendum  created 12/11/16 1020 by Duane Boston, MD   Sign clinical note

## 2017-03-10 DIAGNOSIS — Z96651 Presence of right artificial knee joint: Secondary | ICD-10-CM | POA: Diagnosis not present

## 2017-03-10 DIAGNOSIS — M25561 Pain in right knee: Secondary | ICD-10-CM | POA: Diagnosis not present

## 2017-03-10 DIAGNOSIS — G8929 Other chronic pain: Secondary | ICD-10-CM | POA: Diagnosis not present

## 2017-03-31 ENCOUNTER — Encounter: Payer: Self-pay | Admitting: Family Medicine

## 2017-06-01 ENCOUNTER — Encounter: Payer: Self-pay | Admitting: Family Medicine

## 2017-06-01 ENCOUNTER — Ambulatory Visit: Payer: Medicare Other | Admitting: Family Medicine

## 2017-06-01 VITALS — BP 114/62 | HR 91 | Temp 98.4°F | Wt 169.2 lb

## 2017-06-01 DIAGNOSIS — J069 Acute upper respiratory infection, unspecified: Secondary | ICD-10-CM

## 2017-06-01 MED ORDER — TRAMADOL HCL 50 MG PO TABS: 100.0000 mg | ORAL_TABLET | Freq: Three times a day (TID) | ORAL | 5 refills | Status: DC | PRN

## 2017-06-01 NOTE — Progress Notes (Signed)
   Subjective:    Patient ID: Aimee Lawrence, female    DOB: 05-08-1946, 71 y.o.   MRN: [id number]  HPI Here for 2 days of low grade fevers, muscle aches, a ST, a dry cough, and hoarseness. Drinking fluids. Taking Tramadol prn.    Review of Systems  Constitutional: Positive for fever.  HENT: Positive for congestion, sore throat and voice change. Negative for ear pain, postnasal drip, sinus pressure and sinus pain.   Eyes: Negative.   Respiratory: Positive for cough. Negative for shortness of breath and wheezing.   Musculoskeletal: Positive for myalgias.       Objective:   Physical Exam  Constitutional: She appears well-developed and well-nourished.  HENT:  Right Ear: External ear normal.  Left Ear: External ear normal.  Nose: Nose normal.  Mouth/Throat: Oropharynx is clear and moist.  Eyes: Conjunctivae are normal.  Neck: No thyromegaly present.  Pulmonary/Chest: Effort normal and breath sounds normal. No respiratory distress. She has no wheezes. She has no rales.  Lymphadenopathy:    She has no cervical adenopathy.          Assessment & Plan:  Viral URI. She can add Tylenol 1000 mg bid for the muscle aches a fever. Rest, drink fliuids. Recheck prn.  Alysia Penna, MD

## 2017-08-11 DIAGNOSIS — Z471 Aftercare following joint replacement surgery: Secondary | ICD-10-CM | POA: Diagnosis not present

## 2017-08-11 DIAGNOSIS — M1612 Unilateral primary osteoarthritis, left hip: Secondary | ICD-10-CM | POA: Insufficient documentation

## 2017-08-11 DIAGNOSIS — M1611 Unilateral primary osteoarthritis, right hip: Secondary | ICD-10-CM | POA: Diagnosis not present

## 2017-08-11 DIAGNOSIS — Z96651 Presence of right artificial knee joint: Secondary | ICD-10-CM | POA: Insufficient documentation

## 2017-08-11 DIAGNOSIS — M169 Osteoarthritis of hip, unspecified: Secondary | ICD-10-CM | POA: Insufficient documentation

## 2017-08-11 DIAGNOSIS — M24669 Ankylosis, unspecified knee: Secondary | ICD-10-CM | POA: Diagnosis not present

## 2017-08-15 ENCOUNTER — Encounter: Payer: Self-pay | Admitting: Family Medicine

## 2017-08-15 ENCOUNTER — Ambulatory Visit (INDEPENDENT_AMBULATORY_CARE_PROVIDER_SITE_OTHER): Payer: Medicare Other | Admitting: Family Medicine

## 2017-08-15 VITALS — BP 138/84 | HR 87 | Temp 98.4°F | Wt 170.6 lb

## 2017-08-15 DIAGNOSIS — M25551 Pain in right hip: Secondary | ICD-10-CM | POA: Diagnosis not present

## 2017-08-15 NOTE — Progress Notes (Signed)
   Subjective:    Patient ID: Aimee Lawrence, female    DOB: 09-26-1945, 72 y.o.   MRN: [id number]  HPI Here to get advice about right hip pain. On 06-15-16 she had a right total knee replacement per Dr. Melrose Nakayama. Then on 09-14-16 she had a closed manipulation of this knee. She has continued to have a fair amount of pain in the knee despite this. Then over the past 6 months she has also developed some pain in the right hip. She talked to Dr. Rhona Raider and he felt the pain was the result of gait problems caused by the knee pain. She then saw Dr. Gaynelle Arabian in September for a second opinion and he also felt the hip itself was not the problem. Then she saw Dr. Tillman Abide in [place], [place] for a third opinion. He wants to proceed with a total hip replacement, but she is hesitant to do this. She does have a fair amount of pain in the hip but the knee remains her chief concern. She takes Tramadol as needed.    Review of Systems  Constitutional: Negative.   Respiratory: Negative.   Cardiovascular: Negative.   Musculoskeletal: Positive for arthralgias and gait problem.       Objective:   Physical Exam  Constitutional: She is oriented to person, place, and time. She appears well-developed and well-nourished.  She walks with a limp.   Cardiovascular: Normal rate, regular rhythm, normal heart sounds and intact distal pulses.  Pulmonary/Chest: Effort normal and breath sounds normal. No respiratory distress. She has no wheezes. She has no rales.  Neurological: She is alert and oriented to person, place, and time.          Assessment & Plan:  She has right hip pain on top of post-surgical knee pain. I told her I would not advise her to rush into a hip surgery at this point. I will refer her to Sports Medicine to get a non-surgical opinion about the hip issue.  Alysia Penna, MD

## 2017-08-19 DIAGNOSIS — Z96651 Presence of right artificial knee joint: Secondary | ICD-10-CM | POA: Diagnosis not present

## 2017-08-19 DIAGNOSIS — M24669 Ankylosis, unspecified knee: Secondary | ICD-10-CM | POA: Diagnosis not present

## 2017-08-22 ENCOUNTER — Ambulatory Visit: Payer: Medicare Other | Admitting: Sports Medicine

## 2017-08-22 ENCOUNTER — Encounter: Payer: Self-pay | Admitting: Sports Medicine

## 2017-08-22 ENCOUNTER — Ambulatory Visit: Payer: Self-pay

## 2017-08-22 VITALS — BP 132/80 | HR 87 | Ht 61.0 in | Wt 173.6 lb

## 2017-08-22 DIAGNOSIS — M25551 Pain in right hip: Secondary | ICD-10-CM

## 2017-08-22 DIAGNOSIS — M25561 Pain in right knee: Secondary | ICD-10-CM

## 2017-08-22 DIAGNOSIS — Z96651 Presence of right artificial knee joint: Secondary | ICD-10-CM | POA: Diagnosis not present

## 2017-08-22 DIAGNOSIS — M1711 Unilateral primary osteoarthritis, right knee: Secondary | ICD-10-CM

## 2017-08-22 DIAGNOSIS — M1611 Unilateral primary osteoarthritis, right hip: Secondary | ICD-10-CM | POA: Diagnosis not present

## 2017-08-22 LAB — CBC WITH DIFFERENTIAL/PLATELET
Basophils Absolute: 62 cells/uL (ref 0–200)
Basophils Relative: 0.9 %
EOS PCT: 4.8 %
Eosinophils Absolute: 331 cells/uL (ref 15–500)
HEMATOCRIT: 39 % (ref 35.0–45.0)
Hemoglobin: 13.4 g/dL (ref 11.7–15.5)
LYMPHS ABS: 1339 {cells}/uL (ref 850–3900)
MCH: 30 pg (ref 27.0–33.0)
MCHC: 34.4 g/dL (ref 32.0–36.0)
MCV: 87.4 fL (ref 80.0–100.0)
MONOS PCT: 8.7 %
MPV: 10.3 fL (ref 7.5–12.5)
NEUTROS PCT: 66.2 %
Neutro Abs: 4568 cells/uL (ref 1500–7800)
PLATELETS: 287 10*3/uL (ref 140–400)
RBC: 4.46 10*6/uL (ref 3.80–5.10)
RDW: 12.2 % (ref 11.0–15.0)
TOTAL LYMPHOCYTE: 19.4 %
WBC mixed population: 600 cells/uL (ref 200–950)
WBC: 6.9 10*3/uL (ref 3.8–10.8)

## 2017-08-22 LAB — SEDIMENTATION RATE: SED RATE: 18 mm/h (ref 0–30)

## 2017-08-22 LAB — C-REACTIVE PROTEIN: CRP: 0.8 mg/dL (ref 0.5–20.0)

## 2017-08-22 NOTE — Procedures (Signed)
LIMITED MSK ULTRASOUND OF right knee and hip Images were obtained and interpreted by myself, Teresa Coombs, DO  Images have been saved and stored to PACS system. Images obtained on: GE S7 Ultrasound machine  FINDINGS:   Patellar tendon is intact.  She has a small supraphysiologic effusion that is minimal with generalized synovitis and increased neovascularity within the suprapatellar pouch.  Right hip has a small anterior osteophyte with degenerative spurring appreciated of the femoral acetabular joint.  There is no significant effusion or significant synovitis around the joint.  IMPRESSION:  1. Status post right total knee arthroplasty with small supraphysiologic effusion and increased neovascularity within the synovium. 2. Right hip osteoarthritis moderate to severe with no significant effusion.

## 2017-08-22 NOTE — Patient Instructions (Signed)
Please schedule a f/u visit for either this Friday, Feb. 15h, Monday or Tuesday next week depending on what works best for your schedule to go over lab results.

## 2017-08-22 NOTE — Progress Notes (Signed)
Aimee Lawrence. Rig[place], [place] at [facility] [phone]  Aimee Lawrence - 72 y.o. female MRN [id number]  Date of birth: [birth year]  Visit Date: 08/22/2017  PCP: [name], MD   Referred by: [name], MD   Scribe for today's visit: Wendy Poet, LAT, ATC     SUBJECTIVE:  Aimee Lawrence is here for New Patient (Initial Visit) (R knee and hip pain) .  Referred by: Dr. Sarajane Jews Her R knee and hip pain symptoms INITIALLY: Began about 3 years ago when her knee gave way while helping some friends move items up and down stairs. Described as a stabbing pain 4/10 pain at rest that increases to a 5/10 pain at it's worst  , radiating to R hip.  R hip pain began about a year ago. Worsened with walking and prolonged standing Improved with Tramadol Additional associated symptoms include: numbness in the R ant knee since surgery; some popping and clicking and swelling in the R knee    At this time symptoms show no change compared to onset  She has had a R TKR on 06/15/16 w/ Dr. Rhona Raider f/b a closed knee manipulation on 09/14/16.  She has been to see Dr. Wynelle Link and Dr. Glenna Durand for a  2nd and 3rd opinion w/ the most recent visit being to Dr. Glenna Durand who suggested a R THR. She uses Tramadol and Tylenol prn.   ROS Reports night time disturbances. Denies fevers, chills, or night sweats. Denies unexplained weight loss. Denies personal history of cancer. Denies changes in bowel or bladder habits. Denies recent unreported falls. Reports new or worsening dyspnea or wheezing.  Yes due to asthma. Denies headaches or dizziness.  Reports numbness, tingling or weakness  In the extremities, yes to numbness at the R ant knee Denies dizziness or presyncopal episodes Reports lower extremity edema  - R knee   HISTORY & PERTINENT PRIOR DATA:  Prior History reviewed and updated per electronic medical record.  Significant history, findings, studies and interim changes  include:  reports that she has never smoked. She has never used smokeless tobacco. No results for input(s): HGBA1C, LABURIC, CREATINE in the last 8760 hours. No specialty comments available. No problems updated.  OBJECTIVE:  VS:  HT:5\' 1"  (154.9 cm)   WT:173 lb 9.6 oz (78.7 kg)  BMI:32.82    BP:132/80  HR:87bpm  TEMP: ( )  RESP:98 %   PHYSICAL EXAM: Constitutional: WDWN, Non-toxic appearing. Psychiatric: Alert & appropriately interactive.  Not depressed or anxious appearing. Respiratory: No increased work of breathing.  Trachea Midline Eyes: Pupils are equal.  EOM intact without nystagmus.  No scleral icterus  NEUROVASCULAR exam: No clubbing or cyanosis appreciated No significant venous stasis changes Capillary Refill: normal, less than 2 seconds   Right Knee Exam: Well aligned with postsurgical incision.  Knee arc from 3degrees to 95 degrees. She has some pain with axial load and circumduction of the hip but this is minimal and actually nonexistent in a 9090 supine position.  She has marked weakness with hip abduction strength testing.  Her knee is fairly normal-appearing however and does not have any significant associated warmth with it. No significant venous stasis changes.  ASSESSMENT & PLAN:   1. Recurrent pain of right knee   2. Right hip pain   3. Primary osteoarthritis of right hip   4. S/P TKR (total knee replacement), right   5. Primary localized osteoarthritis of right knee  PLAN: Very difficult situation.  She is status post right total knee arthroplasty with persistent pain since the surgery.  She does have a degree of osteoarthritis of the right hip however I do not think that this is actually contributing to the majority of her issues.  We will plan to see if we can obtain further results of the lab work as well as from her outside providers and plan to see her back in 1 week.  Possibility of immune reaction to the implants needs to be considered versus occult  infection  >50% of this 45 minute visit spent in direct patient counseling and/or coordination of care.  Discussion was focused on education regarding the in discussing the pathoetiology and anticipated clinical course of the above condition.   ++++++++++++++++++++++++++++++++++++++++++++ Orders & Meds: Orders Placed This Encounter  Procedures  . [place] MSK POCT ULTRASOUND  . C-reactive protein  . Sedimentation rate  . CBC with Differential/Platelet    No orders of the defined types were placed in this encounter.   ++++++++++++++++++++++++++++++++++++++++++++ Follow-up: Return in about 4 days (around 08/26/2017) for f/u regarding lab work.   Pertinent documentation may be included in additional procedure notes, imaging studies, problem based documentation and patient instructions. Please see these sections of the encounter for additional information regarding this visit. CMA/ATC served as Education administrator during this visit. History, Physical, and Plan performed by medical provider. Documentation and orders reviewed and attested to.      [name], [place] [facility] Physician

## 2017-08-26 ENCOUNTER — Ambulatory Visit: Payer: Medicare Other | Admitting: Sports Medicine

## 2017-08-29 ENCOUNTER — Telehealth: Payer: Self-pay | Admitting: Family Medicine

## 2017-08-29 ENCOUNTER — Encounter: Payer: Self-pay | Admitting: Sports Medicine

## 2017-08-29 ENCOUNTER — Ambulatory Visit: Payer: Medicare Other | Admitting: Sports Medicine

## 2017-08-29 VITALS — BP 130/80 | HR 83 | Ht 61.0 in | Wt 172.8 lb

## 2017-08-29 DIAGNOSIS — M25561 Pain in right knee: Secondary | ICD-10-CM

## 2017-08-29 DIAGNOSIS — R9389 Abnormal findings on diagnostic imaging of other specified body structures: Secondary | ICD-10-CM | POA: Diagnosis not present

## 2017-08-29 DIAGNOSIS — M25551 Pain in right hip: Secondary | ICD-10-CM

## 2017-08-29 DIAGNOSIS — Z96651 Presence of right artificial knee joint: Secondary | ICD-10-CM

## 2017-08-29 MED ORDER — MONTELUKAST SODIUM 10 MG PO TABS: 10.0000 mg | ORAL_TABLET | Freq: Two times a day (BID) | ORAL | 3 refills | Status: DC

## 2017-08-29 NOTE — Telephone Encounter (Signed)
Copied from [place]. Topic: Quick Communication - See Telephone Encounter >> Aug 29, 2017 11:22 AM Bea Graff, NT wrote: CRM for notification. See Telephone encounter for: Pt states that [facility] on [address] stated that her insurance will not fill her rx for montelukast (SINGULAIR) for twice a day. That they only fill it for a pt to take once a day. Pt wants to know if this could be changed or if she should pay for the medication out of pocket. Please contact pt.  08/29/17.

## 2017-08-29 NOTE — Telephone Encounter (Signed)
Forwarding to Dr. Rigby to advise.  

## 2017-08-29 NOTE — Telephone Encounter (Signed)
Called pt and left VM to call the office.  

## 2017-08-29 NOTE — Patient Instructions (Signed)
Please perform the exercise program that we have prepared for you and gone over in detail on a daily basis.  In addition to the handout you were provided you can access your program through: [email or web address]   Your unique program code is:  [email or web address]

## 2017-08-29 NOTE — Telephone Encounter (Signed)
Okay to begin with once a day dosing for right now and see how she responds.  If incomplete but small response can try for twice daily dosing.  Alternatively she can try purchasing out-of-pocket additional once daily dose

## 2017-08-29 NOTE — Telephone Encounter (Signed)
See Note

## 2017-08-29 NOTE — Procedures (Addendum)
PROCEDURE NOTE: THERAPEUTIC EXERCISES (97110) 15 minutes spent for Therapeutic exercises as below and as referenced in the AVS. This included exercises focusing on stretching, strengthening, with significant focus on eccentric aspects.  Proper technique shown and discussed handout in great detail with ATC. All questions were discussed and answered.   Long term goals include an improvement in range of motion, strength, endurance as well as avoiding reinjury. Frequency of visits is one time as determined during today's  office visit. Frequency of exercises to be performed is as per handout.  EXERCISES REVIEWED:  Hip abduction strengthening  Knee flexion and extension exercises  Hip flexor stretching

## 2017-08-29 NOTE — Progress Notes (Signed)
Aimee Lawrence. Aimee Lawrence, [place] at [facility] [phone]  JENNESSY Lawrence - 72 y.o. female MRN [id number]  Date of birth: 11/10/1945  Visit Date: 08/29/2017  PCP: [name], MD   Referred by: [name], MD   Scribe for today's visit: [name], CMA     SUBJECTIVE:  Aimee Lawrence is here for Follow-up (RT hip and knee pain)  Her R knee and hip pain symptoms INITIALLY: Began about 3 years ago when her knee gave way while helping some friends move items up and down stairs. Described as a stabbing pain 4/10 pain at rest that increases to a 5/10 pain at it's worst  , radiating to R hip.  R hip pain began about a year ago. Worsened with walking and prolonged standing Improved with Tramadol Additional associated symptoms include: numbness in the R ant knee since surgery; some popping and clicking and swelling in the R knee     Compared to the last office visit, her previously described symptoms show no change  Current symptoms are moderate & are radiating to RT leg and gluteal region. She denies pain in the lower back or grain. She continues to notice swelling around the RT knee. She also noticed occasional catching, feels the knee will give out on her. She has not fallen d/t this.  She has been taking Tramadol with minimal relief. She has had closed knee manipulation in the past.  Hx of RT TKR.    ROS Reports night time disturbances. Denies fevers, chills, or night sweats. Denies unexplained weight loss. Denies personal history of cancer. Denies changes in bowel or bladder habits. Denies recent unreported falls. Denies new or worsening dyspnea or wheezing. Denies headaches or dizziness.  Reports numbness, tingling or weakness  In the extremities.  Denies dizziness or presyncopal episodes Reports lower extremity edema     HISTORY & PERTINENT PRIOR DATA:  Prior History reviewed and updated per electronic medical record.    Significant history, findings, studies and interim changes include:  reports that  has never smoked. she has never used smokeless tobacco. No results for input(s): HGBA1C, LABURIC, CREATINE in the last 8760 hours. No specialty comments available. Problem  Abnormal Finding On Imaging    OBJECTIVE:  VS:  HT:5\' 1"  (154.9 cm)   WT:172 lb 12.8 oz (78.4 kg)  BMI:32.67    BP:130/80  HR:83bpm  TEMP: ( )  RESP:97 %   PHYSICAL EXAM: Constitutional: WDWN, Non-toxic appearing. Psychiatric: Alert & appropriately interactive.  Not depressed or anxious appearing. Respiratory: No increased work of breathing.  Trachea Midline Eyes: Pupils are equal.  EOM intact without nystagmus.  No scleral icterus  NEUROVASCULAR exam: No clubbing or cyanosis appreciated No significant venous stasis changes Capillary Refill: normal, less than 2 seconds   She walks with an antalgic gait.  Good internal and external rotation of bilateral hips.  Knee range of motion from 5degrees to 85 degrees of fluidly but able to push slightly past this and flexion uncomfortably.  No solid endpoint.  Extensor mechanism and flexor mechanism strength intact but limited ROM.  Knee is otherwise well aligned with well-healed surgical incision.  No sign of  Lab work reviewed and discussed in detail with patient today as well as results from her white blood cell scan  No additional findings.   ASSESSMENT & PLAN:   1. Recurrent pain of right knee   2. Right hip pain   3.  S/P TKR (total knee replacement), right   4. Abnormal finding on imaging    PLAN: Difficult situation.  Given the arthrofibrosis and findings on her tagged white blood cell scan there is a low possibility of chronic indolent infection.  However, given her completely normal inflammatory markers infection seems to be less likely.  Suspect this is more of an immune allergic response to the implant and like to see if she does with starting her on Singulair twice daily  dosing over the next 6-8 weeks.  We will have her continue with therapeutic exercises as previously described and follow-up in 6 weeks.  Could consider diagnostic arthrocentesis would like to minimize the risk of introducing any type of infection.  Therapeutic exercises represcribed today per procedure note  ++++++++++++++++++++++++++++++++++++++++++++ Orders & Meds: No orders of the defined types were placed in this encounter.   Meds ordered this encounter  Medications  . montelukast (SINGULAIR) 10 MG tablet    Sig: Take 1 tablet (10 mg total) by mouth 2 (two) times daily.    Dispense:  60 tablet    Refill:  3    ++++++++++++++++++++++++++++++++++++++++++++ Follow-up: Return in about 6 weeks (around 10/10/2017).   Pertinent documentation may be included in additional procedure notes, imaging studies, problem based documentation and patient instructions. Please see these sections of the encounter for additional information regarding this visit. CMA/ATC served as Education administrator during this visit. History, Physical, and Plan performed by medical provider. Documentation and orders reviewed and attested to.      [name], [place] [facility] Physician

## 2017-08-30 ENCOUNTER — Encounter: Payer: Self-pay | Admitting: Sports Medicine

## 2017-08-31 NOTE — Telephone Encounter (Signed)
Pt returned call.   Called pt back and left VM to call the office.

## 2017-08-31 NOTE — Telephone Encounter (Signed)
Called pt and left VM to call the office.  

## 2017-08-31 NOTE — Telephone Encounter (Signed)
[employer] is reviewing your PA request. Typically an electronic response will be received within 72 hours. To check for an update later, open this request from your dashboard. 

## 2017-08-31 NOTE — Telephone Encounter (Signed)
Spoke to pt and gave her options of attempting to get the 2nd daily dose of Singulair authorized through her insurance.  She states that if we can't get the 2nd dose authorized, then she will pay for the 2nd dose out of pocket.  We will call her once we hear back from her insurance company.

## 2017-09-01 ENCOUNTER — Other Ambulatory Visit: Payer: Self-pay

## 2017-09-01 MED ORDER — MONTELUKAST SODIUM 10 MG PO TABS: ORAL_TABLET | ORAL | 3 refills | Status: DC

## 2017-09-01 NOTE — Telephone Encounter (Signed)
PA has been denied.  

## 2017-09-01 NOTE — Telephone Encounter (Signed)
Spoke with patient and advised. She would like to continue with twice daily dosing and will pay out of pocket cost using [employer] card.

## 2017-09-01 NOTE — Telephone Encounter (Signed)
Called pt and left VM to call the office.  

## 2017-09-02 ENCOUNTER — Telehealth: Payer: Self-pay | Admitting: Sports Medicine

## 2017-09-02 ENCOUNTER — Other Ambulatory Visit: Payer: Self-pay | Admitting: Physical Therapy

## 2017-09-02 MED ORDER — FAMOTIDINE 20 MG PO TABS: 20.0000 mg | ORAL_TABLET | Freq: Two times a day (BID) | ORAL | 1 refills | Status: DC

## 2017-09-02 NOTE — Telephone Encounter (Signed)
Copied from [place]. Topic: Quick Communication - Rx Refill/Question >> Sep 02, 2017  1:43 PM Oliver Pila B wrote: Medication: Singulair  Pt's pcp is Sarajane Jews, Pt states she is having nausea w/ this medication and is wondering since [name] prescribed the medication if he can prescribe a medication to help w/ the nausea, contact pt to advise

## 2017-09-03 NOTE — Telephone Encounter (Signed)
This patient may need to be triaged to determine if the nausea is actually coming from the singulair

## 2017-09-05 NOTE — Telephone Encounter (Signed)
Attempted to call pt. At home and on mobile phone to discuss her nausea.  Left voice message to call back and ask to talk to a Triage Nurse.

## 2017-09-08 DIAGNOSIS — J301 Allergic rhinitis due to pollen: Secondary | ICD-10-CM | POA: Diagnosis not present

## 2017-09-08 DIAGNOSIS — J3081 Allergic rhinitis due to animal (cat) (dog) hair and dander: Secondary | ICD-10-CM | POA: Diagnosis not present

## 2017-09-08 DIAGNOSIS — J3089 Other allergic rhinitis: Secondary | ICD-10-CM | POA: Diagnosis not present

## 2017-09-08 DIAGNOSIS — J4520 Mild intermittent asthma, uncomplicated: Secondary | ICD-10-CM | POA: Diagnosis not present

## 2017-09-19 DIAGNOSIS — J4520 Mild intermittent asthma, uncomplicated: Secondary | ICD-10-CM | POA: Diagnosis not present

## 2017-09-19 DIAGNOSIS — J3089 Other allergic rhinitis: Secondary | ICD-10-CM | POA: Diagnosis not present

## 2017-09-19 DIAGNOSIS — J301 Allergic rhinitis due to pollen: Secondary | ICD-10-CM | POA: Diagnosis not present

## 2017-09-19 DIAGNOSIS — L230 Allergic contact dermatitis due to metals: Secondary | ICD-10-CM | POA: Diagnosis not present

## 2017-09-20 DIAGNOSIS — J3089 Other allergic rhinitis: Secondary | ICD-10-CM | POA: Diagnosis not present

## 2017-09-20 DIAGNOSIS — J301 Allergic rhinitis due to pollen: Secondary | ICD-10-CM | POA: Diagnosis not present

## 2017-09-20 DIAGNOSIS — J3081 Allergic rhinitis due to animal (cat) (dog) hair and dander: Secondary | ICD-10-CM | POA: Diagnosis not present

## 2017-09-21 DIAGNOSIS — J301 Allergic rhinitis due to pollen: Secondary | ICD-10-CM | POA: Diagnosis not present

## 2017-09-21 DIAGNOSIS — J3089 Other allergic rhinitis: Secondary | ICD-10-CM | POA: Diagnosis not present

## 2017-09-21 DIAGNOSIS — J3081 Allergic rhinitis due to animal (cat) (dog) hair and dander: Secondary | ICD-10-CM | POA: Diagnosis not present

## 2017-09-21 DIAGNOSIS — J4520 Mild intermittent asthma, uncomplicated: Secondary | ICD-10-CM | POA: Diagnosis not present

## 2017-10-10 ENCOUNTER — Ambulatory Visit: Payer: Self-pay

## 2017-10-10 ENCOUNTER — Encounter: Payer: Self-pay | Admitting: Sports Medicine

## 2017-10-10 ENCOUNTER — Ambulatory Visit (INDEPENDENT_AMBULATORY_CARE_PROVIDER_SITE_OTHER): Payer: Medicare Other | Admitting: Sports Medicine

## 2017-10-10 VITALS — BP 130/76 | HR 69 | Ht 61.0 in | Wt 175.2 lb

## 2017-10-10 DIAGNOSIS — M25551 Pain in right hip: Secondary | ICD-10-CM

## 2017-10-10 DIAGNOSIS — R9389 Abnormal findings on diagnostic imaging of other specified body structures: Secondary | ICD-10-CM | POA: Diagnosis not present

## 2017-10-10 DIAGNOSIS — M1611 Unilateral primary osteoarthritis, right hip: Secondary | ICD-10-CM | POA: Diagnosis not present

## 2017-10-10 DIAGNOSIS — J3089 Other allergic rhinitis: Secondary | ICD-10-CM | POA: Diagnosis not present

## 2017-10-10 DIAGNOSIS — M25561 Pain in right knee: Secondary | ICD-10-CM | POA: Diagnosis not present

## 2017-10-10 DIAGNOSIS — J301 Allergic rhinitis due to pollen: Secondary | ICD-10-CM | POA: Diagnosis not present

## 2017-10-10 DIAGNOSIS — J3081 Allergic rhinitis due to animal (cat) (dog) hair and dander: Secondary | ICD-10-CM | POA: Diagnosis not present

## 2017-10-10 DIAGNOSIS — Z96651 Presence of right artificial knee joint: Secondary | ICD-10-CM | POA: Diagnosis not present

## 2017-10-10 NOTE — Patient Instructions (Signed)

## 2017-10-10 NOTE — Procedures (Signed)
PROCEDURE NOTE:  Ultrasound Guided: Injection: Right hip Images were obtained and interpreted by myself, Teresa Coombs, DO  Images have been saved and stored to PACS system. Images obtained on: GE S7 Ultrasound machine    ULTRASOUND FINDINGS:  Small effusion with + osteophytic spurring  DESCRIPTION OF PROCEDURE:  The patient's clinical condition is marked by substantial pain and/or significant functional disability. Other conservative therapy has not provided relief, is contraindicated, or not appropriate. There is a reasonable likelihood that injection will significantly improve the patient's pain and/or functional impairment.   After discussing the risks, benefits and expected outcomes of the injection and all questions were reviewed and answered, the patient wished to undergo the above named procedure.  Verbal consent was obtained.  The ultrasound was used to identify the target structure and adjacent neurovascular structures. The skin was then prepped in sterile fashion and the target structure was injected under direct visualization using sterile technique as below:  PREP: Alcohol and Ethel Chloride APPROACH: direct, stopcock technique, 22g 3.5 in. INJECTATE: 5 cc 1% lidocaine, 2 cc 0.5% Marcaine and 1 cc 40mg /mL DepoMedrol ASPIRATE: None DRESSING: Band-Aid  Post procedural instructions including recommending icing and warning signs for infection were reviewed.    This procedure was well tolerated and there were no complications.   IMPRESSION: Succesful Ultrasound Guided: Injection

## 2017-10-10 NOTE — Progress Notes (Signed)
Aimee Lawrence. Aimee Lawrence, [name] at [facility] [phone]  Aimee Lawrence - 72 y.o. female MRN [id number]  Date of birth: 04-13-1946  Visit Date: 10/10/2017  PCP: [name], MD   Referred by: [name], MD  Scribe for today's visit: [name], CMA     SUBJECTIVE:  Aimee Lawrence is here for Follow-up (R hip/knee pain)  08/22/17: Her R knee and hip pain symptoms INITIALLY: Began about 3 years ago when her knee gave way while helping some friends move items up and down stairs. Described as a stabbing pain 4/10 pain at rest that increases to a 5/10 pain at it's worst  , radiating to R hip.  R hip pain began about a year ago. Worsened with walking and prolonged standing Improved with Tramadol Additional associated symptoms include: numbness in the R ant knee since surgery; some popping and clicking and swelling in the R knee  08/29/17: Compared to the last office visit, her previously described symptoms show no change  Current symptoms are moderate & are radiating to RT leg and gluteal region. She denies pain in the lower back or grain. She continues to notice swelling around the RT knee. She also noticed occasional catching, feels the knee will give out on her. She has not fallen d/t this.  She has been taking Tramadol with minimal relief. She has had closed knee manipulation in the past.  Hx of RT TKR.   10/10/17:  Compared to the last office visit, her previously described symptoms are worsening. About a week ago she noticed increased pain and catching in her R hip. She has noticed increased swelling around the R knee. She started ambulating with a cane last week d/t difficulty walking.  Current symptoms are severe & are non-radiating. She has quick sharp pain in the knee with certain movements.  She has been taking Singulair BID and this has been helping with allergies but not her knee. She has been taking Tramadol prn with minimal  relief. She has also been taking Tylenol prn with minimal relief.  She has a patch test done which showed that she was not allergic to nickel.   ROS Reports night time disturbances. Denies fevers, chills, or night sweats. Denies unexplained weight loss. Denies personal history of cancer. Denies changes in bowel or bladder habits. Denies recent unreported falls. Denies new or worsening dyspnea or wheezing. Reports headache.  Reports numbness, tingling or weakness  In the extremities.  Denies dizziness or presyncopal episodes Reports lower extremity edema    HISTORY & PERTINENT PRIOR DATA:  Prior History reviewed and updated per electronic medical record.  Significant/pertinent history, findings, studies include:  reports that she has never smoked. She has never used smokeless tobacco. No results for input(s): HGBA1C, LABURIC, CREATINE in the last 8760 hours. No specialty comments available. No problems updated.  OBJECTIVE:  VS:  HT:5\' 1"  (154.9 cm)   WT:175 lb 3.2 oz (79.5 kg)  BMI:33.12    BP:130/76  HR:69bpm  TEMP: ( )  RESP:96 %   PHYSICAL EXAM: Constitutional: WDWN, Non-toxic appearing. Psychiatric: Alert & appropriately interactive.  Not depressed or anxious appearing. Respiratory: No increased work of breathing.  Trachea Midline Eyes: Pupils are equal.  EOM intact without nystagmus.  No scleral icterus  Vascular Exam: warm to touch no edema  lower extremity neuro exam: unremarkable normal strength normal sensation normal reflexes  MSK Exam: Right hip has a small amount of  pain with external logroll.  Positive FADIR and Faber.  No significant pain with axial load and circumduction of the hip and minimal pain with Stinchfield testing this does localize to the posterior buttock. Right knee is postsurgical incision.  It is not warm today it is well aligned.  She does have a small amount of laxity with valgus testing but this is minimal.  Range of motion from 5  degrees to 100 degrees.   ASSESSMENT & PLAN:   1. Right hip pain   2. Recurrent pain of right knee   3. S/P TKR (total knee replacement), right   4. Abnormal finding on imaging   5. Primary osteoarthritis of right hip     PLAN: Intra-articular injection performed today.  She has had ongoing issues with the arthrofibrosis and after starting Singulair has noticed only minimal improvements but she does report this is been beneficial for her allergies.  I would like for her to continue this at this time and see how she does with the hip intra-articular injection today.  If any lack of improvement could consider MRI of the hip versus total hip arthroplasty as previously recommended by outside orthopedics.  Follow-up: Return in about 6 weeks (around 11/21/2017).      Please see additional documentation for Objective, Assessment and Plan sections. Pertinent additional documentation may be included in corresponding procedure notes, imaging studies, problem based documentation and patient instructions. Please see these sections of the encounter for additional information regarding this visit.  CMA/ATC served as Education administrator during this visit. History, Physical, and Plan performed by medical provider. Documentation and orders reviewed and attested to.      [name], [place] [facility] Physician

## 2017-10-12 DIAGNOSIS — J301 Allergic rhinitis due to pollen: Secondary | ICD-10-CM | POA: Diagnosis not present

## 2017-10-12 DIAGNOSIS — J3089 Other allergic rhinitis: Secondary | ICD-10-CM | POA: Diagnosis not present

## 2017-10-12 DIAGNOSIS — J3081 Allergic rhinitis due to animal (cat) (dog) hair and dander: Secondary | ICD-10-CM | POA: Diagnosis not present

## 2017-10-17 DIAGNOSIS — J3089 Other allergic rhinitis: Secondary | ICD-10-CM | POA: Diagnosis not present

## 2017-10-17 DIAGNOSIS — J3081 Allergic rhinitis due to animal (cat) (dog) hair and dander: Secondary | ICD-10-CM | POA: Diagnosis not present

## 2017-10-17 DIAGNOSIS — J301 Allergic rhinitis due to pollen: Secondary | ICD-10-CM | POA: Diagnosis not present

## 2017-10-19 DIAGNOSIS — J3081 Allergic rhinitis due to animal (cat) (dog) hair and dander: Secondary | ICD-10-CM | POA: Diagnosis not present

## 2017-10-19 DIAGNOSIS — J301 Allergic rhinitis due to pollen: Secondary | ICD-10-CM | POA: Diagnosis not present

## 2017-10-19 DIAGNOSIS — J3089 Other allergic rhinitis: Secondary | ICD-10-CM | POA: Diagnosis not present

## 2017-10-21 DIAGNOSIS — J3089 Other allergic rhinitis: Secondary | ICD-10-CM | POA: Diagnosis not present

## 2017-10-21 DIAGNOSIS — J3081 Allergic rhinitis due to animal (cat) (dog) hair and dander: Secondary | ICD-10-CM | POA: Diagnosis not present

## 2017-10-21 DIAGNOSIS — J301 Allergic rhinitis due to pollen: Secondary | ICD-10-CM | POA: Diagnosis not present

## 2017-10-26 DIAGNOSIS — J3089 Other allergic rhinitis: Secondary | ICD-10-CM | POA: Diagnosis not present

## 2017-10-26 DIAGNOSIS — J3081 Allergic rhinitis due to animal (cat) (dog) hair and dander: Secondary | ICD-10-CM | POA: Diagnosis not present

## 2017-10-26 DIAGNOSIS — J301 Allergic rhinitis due to pollen: Secondary | ICD-10-CM | POA: Diagnosis not present

## 2017-11-04 DIAGNOSIS — J3089 Other allergic rhinitis: Secondary | ICD-10-CM | POA: Diagnosis not present

## 2017-11-04 DIAGNOSIS — J301 Allergic rhinitis due to pollen: Secondary | ICD-10-CM | POA: Diagnosis not present

## 2017-11-04 DIAGNOSIS — J3081 Allergic rhinitis due to animal (cat) (dog) hair and dander: Secondary | ICD-10-CM | POA: Diagnosis not present

## 2017-11-09 DIAGNOSIS — J3081 Allergic rhinitis due to animal (cat) (dog) hair and dander: Secondary | ICD-10-CM | POA: Diagnosis not present

## 2017-11-09 DIAGNOSIS — J301 Allergic rhinitis due to pollen: Secondary | ICD-10-CM | POA: Diagnosis not present

## 2017-11-09 DIAGNOSIS — J3089 Other allergic rhinitis: Secondary | ICD-10-CM | POA: Diagnosis not present

## 2017-11-11 DIAGNOSIS — J3089 Other allergic rhinitis: Secondary | ICD-10-CM | POA: Diagnosis not present

## 2017-11-11 DIAGNOSIS — J3081 Allergic rhinitis due to animal (cat) (dog) hair and dander: Secondary | ICD-10-CM | POA: Diagnosis not present

## 2017-11-11 DIAGNOSIS — J301 Allergic rhinitis due to pollen: Secondary | ICD-10-CM | POA: Diagnosis not present

## 2017-11-16 DIAGNOSIS — J3089 Other allergic rhinitis: Secondary | ICD-10-CM | POA: Diagnosis not present

## 2017-11-16 DIAGNOSIS — J301 Allergic rhinitis due to pollen: Secondary | ICD-10-CM | POA: Diagnosis not present

## 2017-11-16 DIAGNOSIS — J3081 Allergic rhinitis due to animal (cat) (dog) hair and dander: Secondary | ICD-10-CM | POA: Diagnosis not present

## 2017-11-18 DIAGNOSIS — J301 Allergic rhinitis due to pollen: Secondary | ICD-10-CM | POA: Diagnosis not present

## 2017-11-18 DIAGNOSIS — J3089 Other allergic rhinitis: Secondary | ICD-10-CM | POA: Diagnosis not present

## 2017-11-18 DIAGNOSIS — J3081 Allergic rhinitis due to animal (cat) (dog) hair and dander: Secondary | ICD-10-CM | POA: Diagnosis not present

## 2017-11-21 ENCOUNTER — Ambulatory Visit: Payer: Medicare Other | Admitting: Sports Medicine

## 2017-11-25 DIAGNOSIS — J301 Allergic rhinitis due to pollen: Secondary | ICD-10-CM | POA: Diagnosis not present

## 2017-11-25 DIAGNOSIS — J3089 Other allergic rhinitis: Secondary | ICD-10-CM | POA: Diagnosis not present

## 2017-11-28 ENCOUNTER — Ambulatory Visit: Payer: Medicare Other | Admitting: Sports Medicine

## 2017-11-28 ENCOUNTER — Ambulatory Visit: Payer: Self-pay

## 2017-11-28 ENCOUNTER — Encounter: Payer: Self-pay | Admitting: Sports Medicine

## 2017-11-28 VITALS — BP 120/82 | HR 77 | Ht 61.0 in | Wt 175.8 lb

## 2017-11-28 DIAGNOSIS — M25551 Pain in right hip: Secondary | ICD-10-CM | POA: Diagnosis not present

## 2017-11-28 DIAGNOSIS — Z96651 Presence of right artificial knee joint: Secondary | ICD-10-CM | POA: Diagnosis not present

## 2017-11-28 DIAGNOSIS — J301 Allergic rhinitis due to pollen: Secondary | ICD-10-CM | POA: Diagnosis not present

## 2017-11-28 DIAGNOSIS — M1611 Unilateral primary osteoarthritis, right hip: Secondary | ICD-10-CM

## 2017-11-28 DIAGNOSIS — J3081 Allergic rhinitis due to animal (cat) (dog) hair and dander: Secondary | ICD-10-CM | POA: Diagnosis not present

## 2017-11-28 DIAGNOSIS — M25561 Pain in right knee: Secondary | ICD-10-CM | POA: Diagnosis not present

## 2017-11-28 DIAGNOSIS — M24661 Ankylosis, right knee: Secondary | ICD-10-CM

## 2017-11-28 DIAGNOSIS — J3089 Other allergic rhinitis: Secondary | ICD-10-CM | POA: Diagnosis not present

## 2017-11-28 MED ORDER — NITROGLYCERIN 0.2 MG/HR TD PT24: MEDICATED_PATCH | TRANSDERMAL | 1 refills | Status: DC

## 2017-11-28 NOTE — Patient Instructions (Signed)

## 2017-11-28 NOTE — Progress Notes (Signed)
LIMITED MSK ULTRASOUND OF Right knee Images were obtained and interpreted by myself, Teresa Coombs, DO  Images have been saved and stored to PACS system. Images obtained on: GE S7 Ultrasound machine  FINDINGS:   Postoperative changes appreciated with fibrosis of the quadriceps tendon.  There is minimal neovascularity.  No significant interstitial splitting but thickened and increased echotexture consistent with fibrosis is noted.  No effusion appreciated.    No significant synovitis.  IMPRESSION:  1. s/p r tka with arthrofibrosis

## 2017-11-28 NOTE — Progress Notes (Signed)
[name]. Rigby, [place] at [facility] [phone]  [name] - 72 y.o. female MRN [id number]  Date of birth: [birth year]  Visit Date: 11/28/2017  PCP: [name], MD   Referred by: [name], MD  Scribe for today's visit: [name], CMA     SUBJECTIVE:  Aimee Lawrence is here for Follow-up (R hip pain)  08/22/17: Her R knee and hip pain symptoms INITIALLY: Began about 3 years ago when her knee gave way while helping some friends move items up and down stairs. Described as a stabbing pain 4/10 pain at rest that increases to a 5/10 pain at it's worst  , radiating to R hip.  R hip pain began about a year ago. Worsened with walking and prolonged standing Improved with Tramadol Additional associated symptoms include: numbness in the R ant knee since surgery; some popping and clicking and swelling in the R knee  08/29/17: Compared to the last office visit, her previously described symptoms show no change  Current symptoms are moderate & are radiating to RT leg and gluteal region. She denies pain in the lower back or grain. She continues to notice swelling around the RT knee. She also noticed occasional catching, feels the knee will give out on her. She has not fallen d/t this.  She has been taking Tramadol with minimal relief. She has had closed knee manipulation in the past.  Hx of RT TKR.   10/10/17: Compared to the last office visit, her previously described symptoms are worsening. About a week ago she noticed increased pain and catching in her R hip. She has noticed increased swelling around the R knee. She started ambulating with a cane last week d/t difficulty walking.  Current symptoms are severe & are non-radiating. She has quick sharp pain in the knee with certain movements.  She has been taking Singulair BID and this has been helping with allergies but not her knee. She has been taking Tramadol prn with minimal relief. She  has also been taking Tylenol prn with minimal relief.  She has a patch test done which showed that she was not allergic to nickel.   11/28/2017: Compared to the last office visit, her previously described symptoms are improving. She got relief from pain after injection but sx are starting to return.  Current symptoms are moderate & are non-radiating. She has pain with weight bearing and walking. Today she is having mild pain while sitting.  She has been taking Tylenol and Tramadol prn with some relief. She was advised to continue taking Singulair daily.  She received intra-articular injection 10/10/2017 and responded well.   She also c/o constant swelling in R knee.   ROS Reports night time disturbances. Denies fevers, chills, or night sweats. Denies unexplained weight loss. Denies personal history of cancer. Denies changes in bowel or bladder habits. Denies recent unreported falls. Denies new or worsening dyspnea or wheezing. Denies headaches or dizziness.  Denies numbness, tingling or weakness  In the extremities.  Denies dizziness or presyncopal episodes Reports lower extremity edema    HISTORY & PERTINENT PRIOR DATA:  Prior History reviewed and updated per electronic medical record.  Significant/pertinent history, findings, studies include:  reports that she has never smoked. She has never used smokeless tobacco. No results for input(s): HGBA1C, LABURIC, CREATINE in the last 8760 hours. No specialty comments available. No problems updated.  OBJECTIVE:  VS:  HT:5\' 1"  (154.9 cm)  WT:175 lb 12.8 oz (79.7 kg)  BMI:33.23    BP:120/82  HR:77bpm  TEMP: ( )  RESP:98 %   PHYSICAL EXAM: Constitutional: WDWN, Non-toxic appearing. Psychiatric: Alert & appropriately interactive.  Not depressed or anxious appearing. Respiratory: No increased work of breathing.  Trachea Midline Eyes: Pupils are equal.  EOM intact without nystagmus.  No scleral icterus  Vascular Exam: warm to  touch no edema  lower extremity neuro exam: unremarkable  MSK Exam: Small amount of pain with Stinchfield testing on the right which is not present on the left.  She has limitations in her knee range of motion and has tightness following passive flexion extension but does have improved flexion and with almost 0 degrees extension when distracted.  Ligamentously with varus and valgus stressing her knee is normal.  She does have some pain over the anterior aspect of the knee directly over the quadriceps tendon but this is mild.  Overlying skin changes are normal postoperative changes without significant erythema.  There is no increased warmth.  Right hip has limited pain with internal rotation and external rotation with overall good range of motion.   ASSESSMENT & PLAN:   1. Right hip pain   2. Recurrent pain of right knee   3. S/P TKR (total knee replacement), right   4. Primary osteoarthritis of right hip   5. Arthrofibrosis of knee joint, right     PLAN: Ultimately she is competent course.  I would like to start on nitroglycerin protocol for her right quadriceps tendon given the postoperative changes with the hope in this may provide her some additional healing potential with the arthrofibrosis.  Her knee overall does not appear to have any type of effusion today still suspect the likelihood of this being a chronic infection is low especially given that she has had some response to Singulair from an effusion standpoint.  She does report the Singulair is helping her allergies.  We will have her discontinue the famotidine that was started due to some GI upset while starting the Singulair.  We can consider repeating the ultrasound-guided hip injection 4 to 6 weeks given she had excellent improvement in her groin symptoms.  This did not affect her right knee in any way whatsoever.  She does understand a total hip arthroplasty may be beneficial for her groin pain she is having again discussed that  given that she did not have any improvement in her other leg symptoms that the likelihood of the total hip arthroplasty helping her knee pain is low.  Follow-up: Return in about 6 weeks (around 01/09/2018).      Please see additional documentation for Objective, Assessment and Plan sections. Pertinent additional documentation may be included in corresponding procedure notes, imaging studies, problem based documentation and patient instructions. Please see these sections of the encounter for additional information regarding this visit.  CMA/ATC served as Education administrator during this visit. History, Physical, and Plan performed by medical provider. Documentation and orders reviewed and attested to.      [name], [place] [facility] Physician

## 2017-11-30 DIAGNOSIS — J3089 Other allergic rhinitis: Secondary | ICD-10-CM | POA: Diagnosis not present

## 2017-11-30 DIAGNOSIS — J301 Allergic rhinitis due to pollen: Secondary | ICD-10-CM | POA: Diagnosis not present

## 2017-11-30 DIAGNOSIS — J3081 Allergic rhinitis due to animal (cat) (dog) hair and dander: Secondary | ICD-10-CM | POA: Diagnosis not present

## 2017-12-12 ENCOUNTER — Other Ambulatory Visit: Payer: Self-pay | Admitting: Family Medicine

## 2017-12-12 NOTE — Telephone Encounter (Signed)
Last OV 11/28/2017   Last refilled 06/01/2017 disp 180 with 5 refills   Sent to PCP to advise

## 2017-12-13 NOTE — Telephone Encounter (Signed)
Call in #[phone] with 5 rf 

## 2017-12-14 DIAGNOSIS — J3089 Other allergic rhinitis: Secondary | ICD-10-CM | POA: Diagnosis not present

## 2017-12-14 DIAGNOSIS — J3081 Allergic rhinitis due to animal (cat) (dog) hair and dander: Secondary | ICD-10-CM | POA: Diagnosis not present

## 2017-12-14 DIAGNOSIS — J301 Allergic rhinitis due to pollen: Secondary | ICD-10-CM | POA: Diagnosis not present

## 2017-12-16 DIAGNOSIS — J301 Allergic rhinitis due to pollen: Secondary | ICD-10-CM | POA: Diagnosis not present

## 2017-12-16 DIAGNOSIS — J3089 Other allergic rhinitis: Secondary | ICD-10-CM | POA: Diagnosis not present

## 2017-12-16 DIAGNOSIS — J3081 Allergic rhinitis due to animal (cat) (dog) hair and dander: Secondary | ICD-10-CM | POA: Diagnosis not present

## 2017-12-19 DIAGNOSIS — J3081 Allergic rhinitis due to animal (cat) (dog) hair and dander: Secondary | ICD-10-CM | POA: Diagnosis not present

## 2017-12-19 DIAGNOSIS — J301 Allergic rhinitis due to pollen: Secondary | ICD-10-CM | POA: Diagnosis not present

## 2017-12-19 DIAGNOSIS — J3089 Other allergic rhinitis: Secondary | ICD-10-CM | POA: Diagnosis not present

## 2017-12-22 DIAGNOSIS — J301 Allergic rhinitis due to pollen: Secondary | ICD-10-CM | POA: Diagnosis not present

## 2017-12-22 DIAGNOSIS — J3089 Other allergic rhinitis: Secondary | ICD-10-CM | POA: Diagnosis not present

## 2017-12-22 DIAGNOSIS — J3081 Allergic rhinitis due to animal (cat) (dog) hair and dander: Secondary | ICD-10-CM | POA: Diagnosis not present

## 2017-12-26 DIAGNOSIS — J301 Allergic rhinitis due to pollen: Secondary | ICD-10-CM | POA: Diagnosis not present

## 2017-12-26 DIAGNOSIS — J3089 Other allergic rhinitis: Secondary | ICD-10-CM | POA: Diagnosis not present

## 2017-12-26 DIAGNOSIS — J3081 Allergic rhinitis due to animal (cat) (dog) hair and dander: Secondary | ICD-10-CM | POA: Diagnosis not present

## 2018-01-03 DIAGNOSIS — J301 Allergic rhinitis due to pollen: Secondary | ICD-10-CM | POA: Diagnosis not present

## 2018-01-03 DIAGNOSIS — J3081 Allergic rhinitis due to animal (cat) (dog) hair and dander: Secondary | ICD-10-CM | POA: Diagnosis not present

## 2018-01-03 DIAGNOSIS — J3089 Other allergic rhinitis: Secondary | ICD-10-CM | POA: Diagnosis not present

## 2018-01-05 DIAGNOSIS — J301 Allergic rhinitis due to pollen: Secondary | ICD-10-CM | POA: Diagnosis not present

## 2018-01-05 DIAGNOSIS — J3081 Allergic rhinitis due to animal (cat) (dog) hair and dander: Secondary | ICD-10-CM | POA: Diagnosis not present

## 2018-01-05 DIAGNOSIS — J3089 Other allergic rhinitis: Secondary | ICD-10-CM | POA: Diagnosis not present

## 2018-01-09 ENCOUNTER — Ambulatory Visit: Payer: Medicare Other | Admitting: Sports Medicine

## 2018-01-09 ENCOUNTER — Encounter: Payer: Self-pay | Admitting: Sports Medicine

## 2018-01-09 VITALS — BP 130/80 | HR 74 | Ht 61.0 in | Wt 173.6 lb

## 2018-01-09 DIAGNOSIS — M25551 Pain in right hip: Secondary | ICD-10-CM

## 2018-01-09 DIAGNOSIS — M24661 Ankylosis, right knee: Secondary | ICD-10-CM

## 2018-01-09 DIAGNOSIS — Z96651 Presence of right artificial knee joint: Secondary | ICD-10-CM | POA: Diagnosis not present

## 2018-01-09 DIAGNOSIS — L814 Other melanin hyperpigmentation: Secondary | ICD-10-CM | POA: Diagnosis not present

## 2018-01-09 DIAGNOSIS — L821 Other seborrheic keratosis: Secondary | ICD-10-CM | POA: Diagnosis not present

## 2018-01-09 DIAGNOSIS — M9906 Segmental and somatic dysfunction of lower extremity: Secondary | ICD-10-CM | POA: Diagnosis not present

## 2018-01-09 DIAGNOSIS — M25561 Pain in right knee: Secondary | ICD-10-CM

## 2018-01-09 DIAGNOSIS — D1801 Hemangioma of skin and subcutaneous tissue: Secondary | ICD-10-CM | POA: Diagnosis not present

## 2018-01-09 DIAGNOSIS — D225 Melanocytic nevi of trunk: Secondary | ICD-10-CM | POA: Diagnosis not present

## 2018-01-09 MED ORDER — NITROGLYCERIN 0.2 MG/HR TD PT24: MEDICATED_PATCH | TRANSDERMAL | 1 refills | Status: DC

## 2018-01-09 NOTE — Progress Notes (Signed)
[name]. Rigby, [place] at [facility] [phone]  Aimee Lawrence - 72 y.o. female MRN [id number]  Date of birth: [birth year]  Visit Date: 01/09/2018  PCP: [name], MD   Referred by: [name], MD  Scribe(s) for today's visit: [name], CMA  SUBJECTIVE:  Aimee Lawrence is here for Follow-up (R hip pain)   08/22/17: Her R knee and hip pain symptoms INITIALLY: Began about 3 years ago when her knee gave way while helping some friends move items up and down stairs. Described as a stabbing pain 4/10 pain at rest that increases to a 5/10 pain at it's worst  , radiating to R hip.  R hip pain began about a year ago. Worsened with walking and prolonged standing Improved with Tramadol Additional associated symptoms include: numbness in the R ant knee since surgery; some popping and clicking and swelling in the R knee  08/29/17: Compared to the last office visit, her previously described symptoms show no change  Current symptoms are moderate & are radiating to RT leg and gluteal region. She denies pain in the lower back or grain. She continues to notice swelling around the RT knee. She also noticed occasional catching, feels the knee will give out on her. She has not fallen d/t this.  She has been taking Tramadol with minimal relief. She has had closed knee manipulation in the past.  Hx of RT TKR.   10/10/17: Compared to the last office visit, her previously described symptoms are worsening. About a week ago she noticed increased pain and catching in her R hip. She has noticed increased swelling around the R knee. She started ambulating with a cane last week d/t difficulty walking.  Current symptoms are severe & are non-radiating. She has quick sharp pain in the knee with certain movements.  She has been taking Singulair BID and this has been helping with allergies but not her knee. She has been taking Tramadol prn with minimal relief. She  has also been taking Tylenol prn with minimal relief.  She has a patch test done which showed that she was not allergic to nickel.   11/28/2017: Compared to the last office visit, her previously described symptoms are improving. She got relief from pain after injection but sx are starting to return.  Current symptoms are moderate & are non-radiating. She has pain with weight bearing and walking. Today she is having mild pain while sitting.  She has been taking Tylenol and Tramadol prn with some relief. She was advised to continue taking Singulair daily.  She received intra-articular injection 10/10/2017 and responded well.  She also c/o constant swelling in R knee.   01/09/2018: Compared to the last office visit on 11/28/17, her previously described R hip and R knee pain symptoms show no change. Current symptoms are moderate & are nonradiating She has been taking Tramadol, Tylenol and Singulair.  She started on a nitro protocol but doesn't fell that the nitro patches are helping.  She had a R hip injection on 10/10/17 and responded well to that.   REVIEW OF SYSTEMS: Denies night time disturbances. Denies fevers, chills, or night sweats. Denies unexplained weight loss. Denies personal history of cancer. Denies changes in bowel or bladder habits. Denies recent unreported falls. Denies new or worsening dyspnea or wheezing. Denies headaches or dizziness.  Denies numbness, tingling or weakness  In the extremities.  Denies dizziness or presyncopal episodes Reports lower extremity  edema    HISTORY & PERTINENT PRIOR DATA:  Prior History reviewed and updated per electronic medical record.  Significant/pertinent history, findings, studies include:  reports that she has never smoked. She has never used smokeless tobacco. No results for input(s): HGBA1C, LABURIC, CREATINE in the last 8760 hours. No specialty comments available. No problems updated.  OBJECTIVE:  VS:  HT:5\' 1"  (154.9 cm)   WT:173  lb 9.6 oz (78.7 kg)  BMI:32.82    BP:130/80  HR:74bpm  TEMP: ( )  RESP:97 %   PHYSICAL EXAM: Constitutional: WDWN, Non-toxic appearing. Psychiatric: Alert & appropriately interactive.  Not depressed or anxious appearing. Respiratory: No increased work of breathing.  Trachea Midline Eyes: Pupils are equal.  EOM intact without nystagmus.  No scleral icterus  Vascular Exam: warm to touch no edema  lower extremity neuro exam: unremarkable normal strength  normal sensation  MSK Exam: She has a negative Stinchfield test today.  Good internal and external rotation of the right hip.  She has marked limitations in flexion of the right knee.  Well-healed postsurgical incision with significant fibrosis and tissue texture changes over the quadriceps tendon.  Range of motion is from 0degrees to 90 degrees when in a relaxed and distracted position but she does tighten up and have limited flexion when having acute muscle pain.   ASSESSMENT & PLAN:   1. Right hip pain   2. Recurrent pain of right knee   3. S/P TKR (total knee replacement), right   4. Arthrofibrosis of knee joint, right     PLAN: Osteopathic manipulation was performed today based on physical exam findings.  Please see procedure note for further information including Osteopathic Exam findings  Ultimately her underlying arthrofibrosis is complicating her recovery from her knee surgery.  Prior work-up has been inconclusive we did discuss further diagnostic evaluation with MRI versus referral back to surgery.  The intra-articular injection she had in her left hip provided only mild relief of her right groin pain but had no effect on her right leg pain.    Could consider saphenous nerve injection if any evidence of neuroma on MSK ultrasound to follow-up with this is likely not the case given the diffuse pain she is having and the degree of fibrosis.  Follow-up in 6 weeks to ensure clinical improvement given the overall improved  functional status that she is having.  Her pain continues to be fairly consistent but once again she is able to increase her activities including gardening over the past 6 weeks.  If any lack of improvement could consider return to physical therapy for scar tissue mobilization.    Follow-up: Return in about 6 weeks (around 02/20/2018).     PROCEDURE NOTE : OSTEOPATHIC MANIPULATION The decision today to treat with Osteopathic Manipulative Therapy (OMT) was based on physical exam findings. Verbal consent was obtained following a discussion with the patient regarding the of risks, benefits and potential side effects, including an acute pain flare,post manipulation soreness and need for repeat treatments.     Contraindications to OMT reviewed and include: NONE  Manipulation was performed as below: Regions treated: Lower extremities OMT Techniques Used: muscle energy, myofascial release and soft tissue  The patient tolerated the treatment well and reported Improved symptoms following treatment today. Patient was given medications, exercises, stretches and lifestyle modifications per AVS and verbally.   OSTEOPATHIC/STRUCTURAL EXAM:   Knee is in a extended position with significant tissue texture changes over the prior fibrotic scar.  There is significant TART  changes that improve with soft tissue mobilization.      Please see additional documentation for Objective, Assessment and Plan sections. Pertinent additional documentation may be included in corresponding procedure notes, imaging studies, problem based documentation and patient instructions. Please see these sections of the encounter for additional information regarding this visit.  CMA/ATC served as Education administrator during this visit. History, Physical, and Plan performed by medical provider. Documentation and orders reviewed and attested to.      [name], [place] [facility] Physician

## 2018-01-10 DIAGNOSIS — J3081 Allergic rhinitis due to animal (cat) (dog) hair and dander: Secondary | ICD-10-CM | POA: Diagnosis not present

## 2018-01-10 DIAGNOSIS — J3089 Other allergic rhinitis: Secondary | ICD-10-CM | POA: Diagnosis not present

## 2018-01-10 DIAGNOSIS — J301 Allergic rhinitis due to pollen: Secondary | ICD-10-CM | POA: Diagnosis not present

## 2018-01-10 IMAGING — CR DG CHEST 2V
2 series · 2 of 2 positions shown · non-contrast
Comparison: 12/29/2012

CLINICAL DATA: Preoperative evaluation.

EXAM:
CHEST  2 VIEW

[w chest pa]
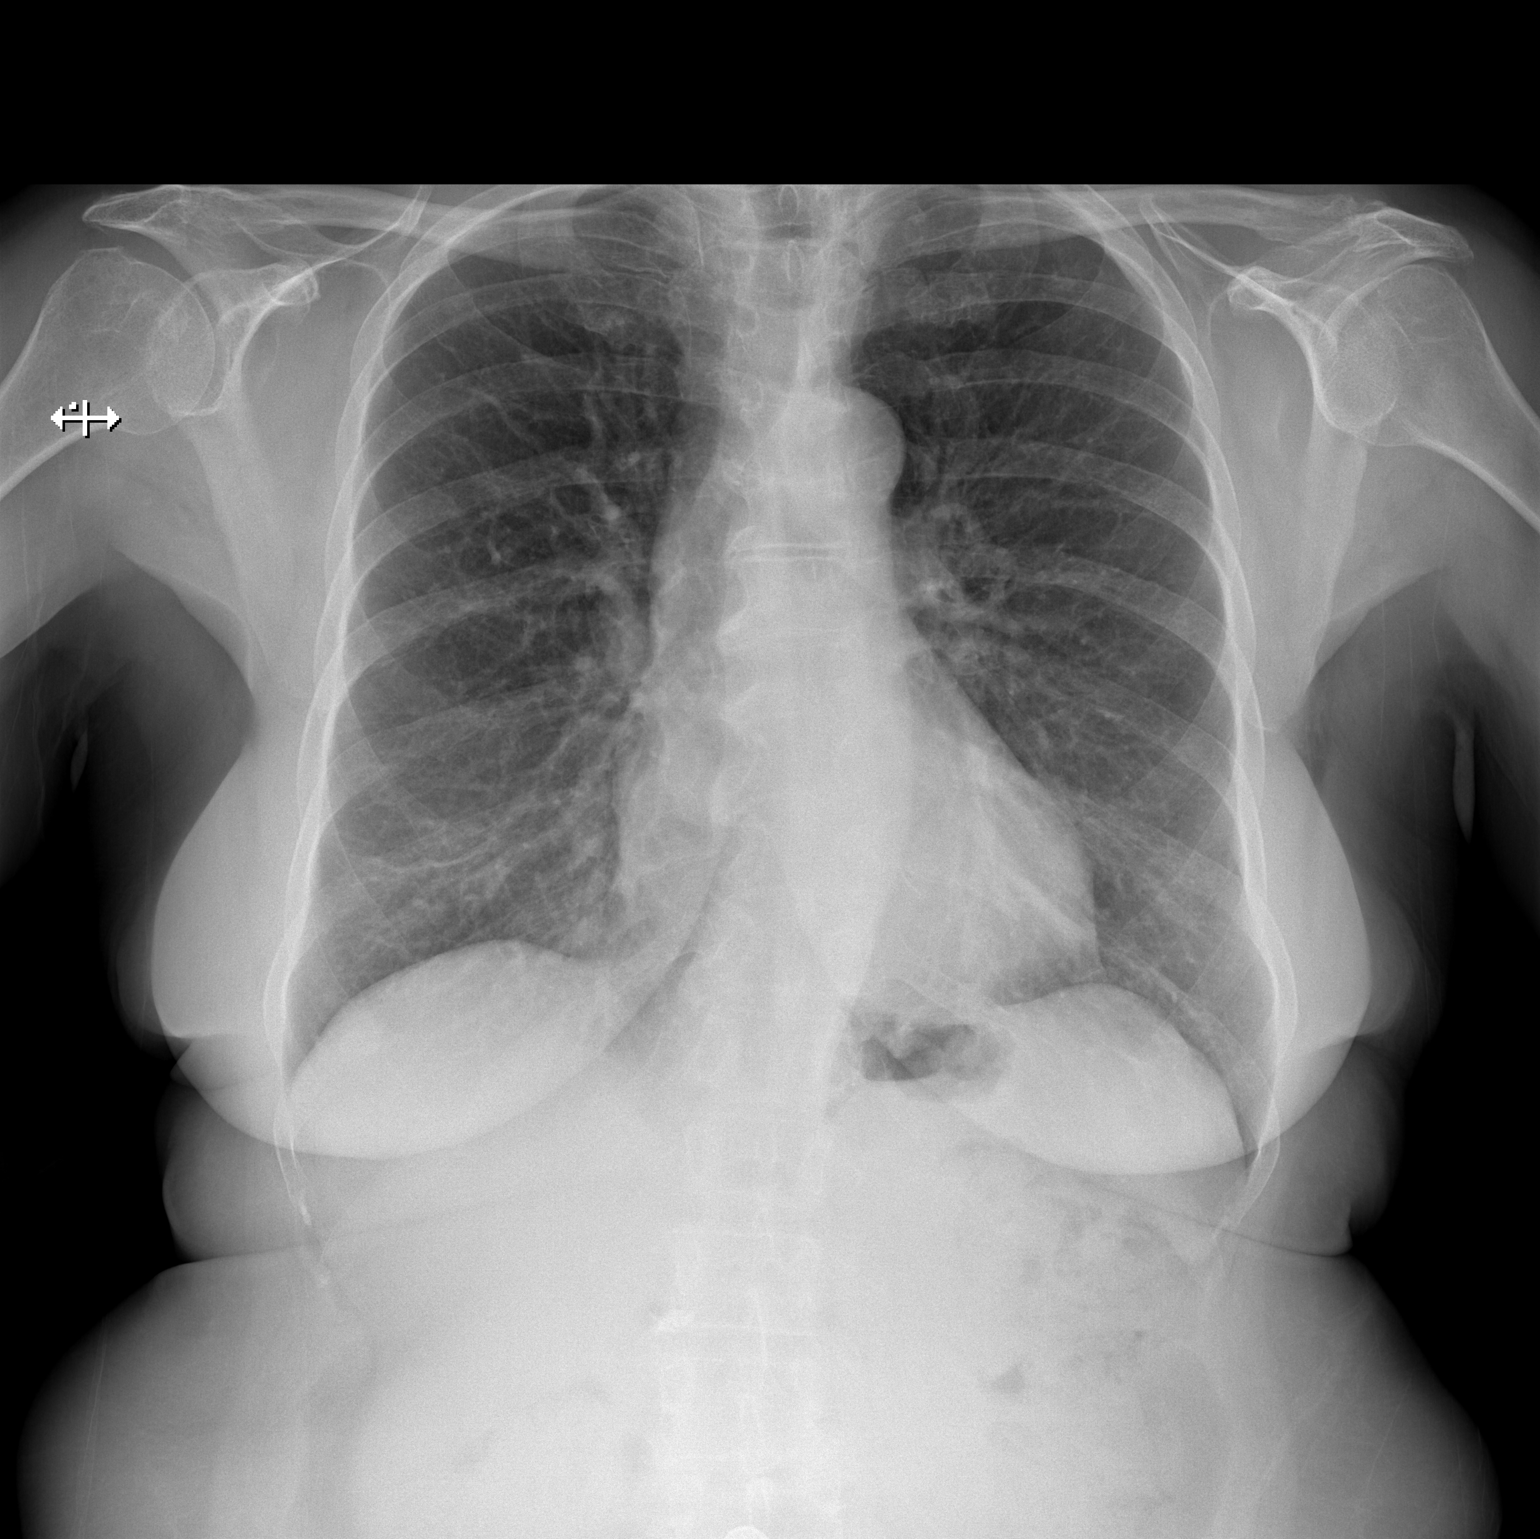

[w chest lat]
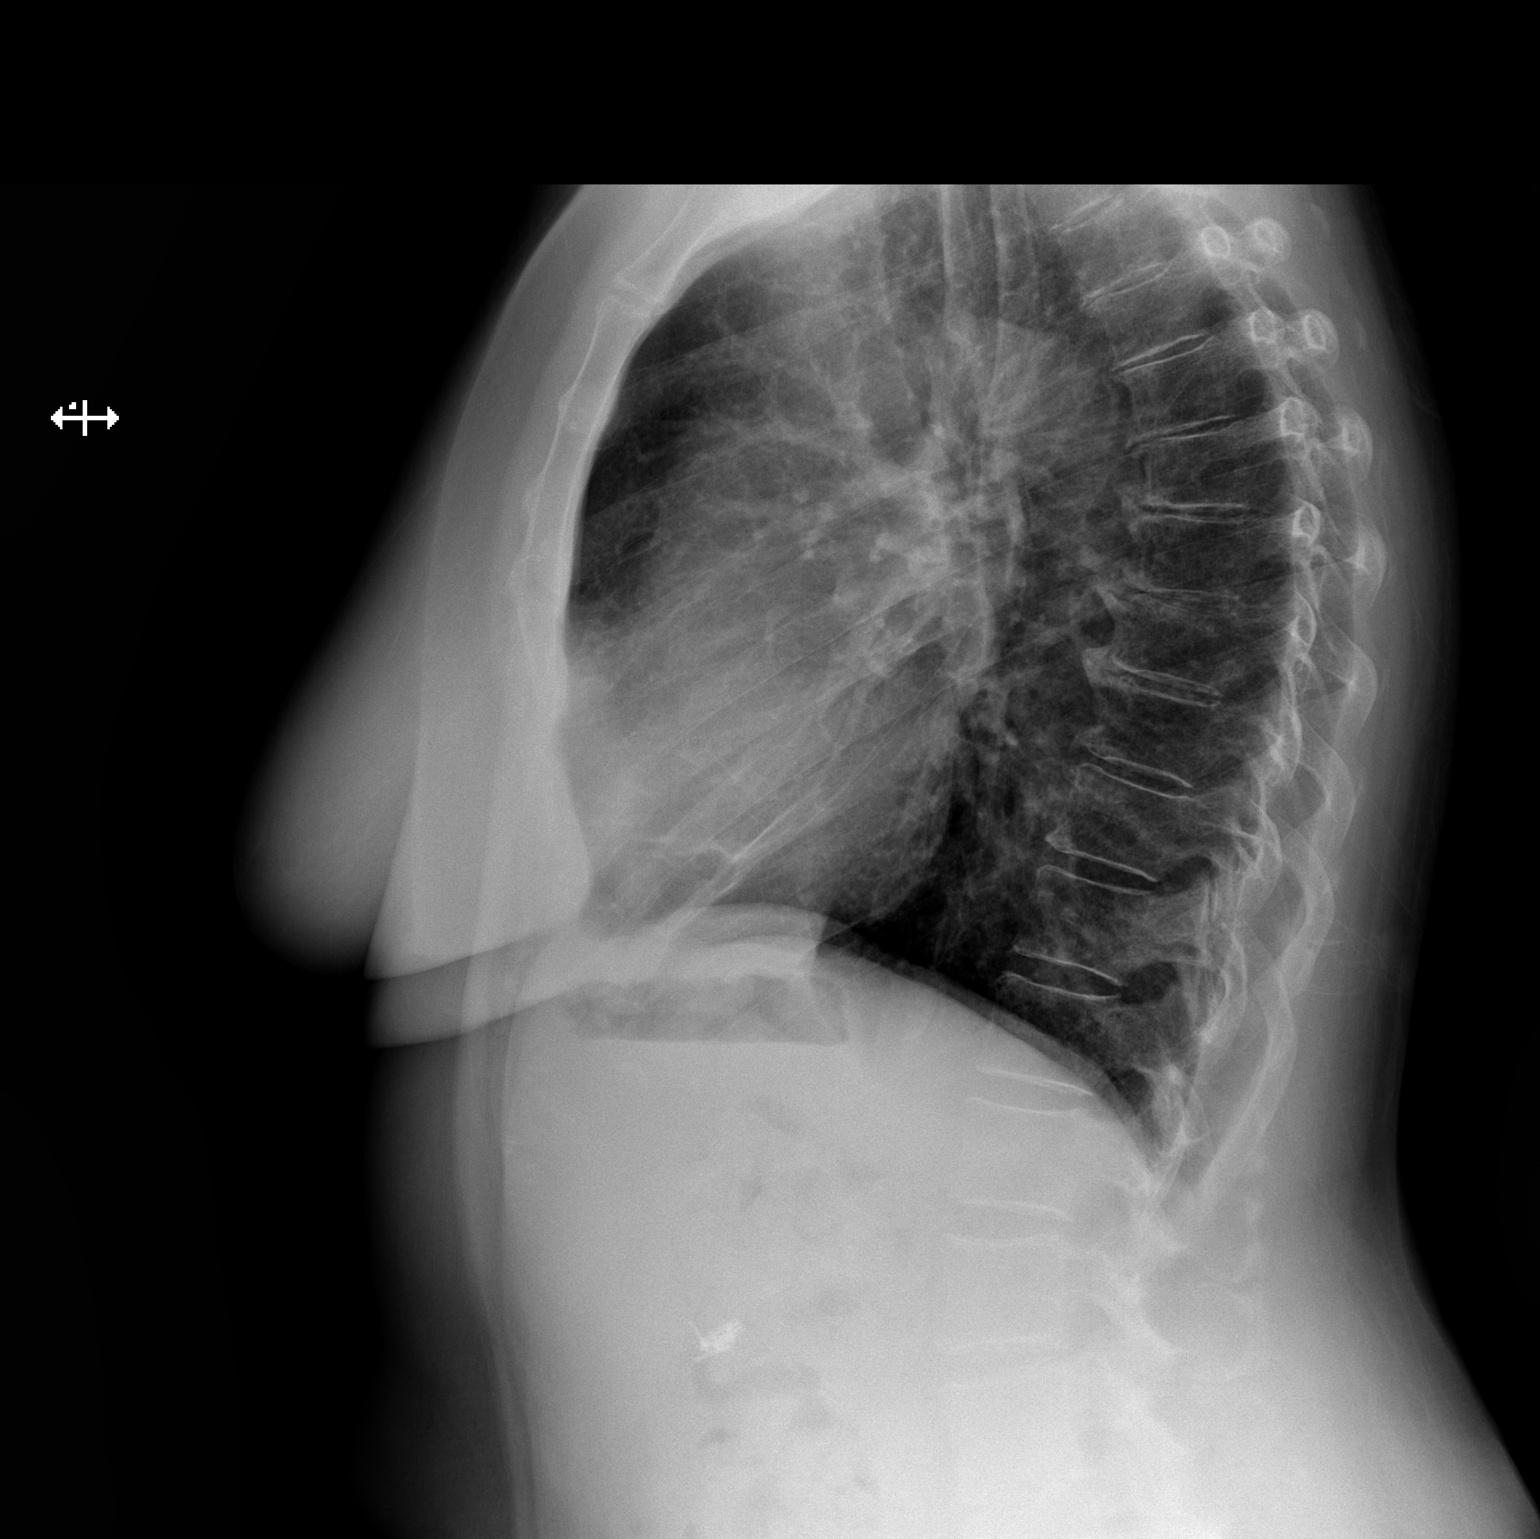

[2 of 2 positions shown; findings below may reference images not displayed]

FINDINGS: Both lungs are clear. Heart and mediastinum are within normal
limits. The trachea is midline. Mild degenerative changes in the mid
thoracic spine. No pleural effusions. Surgical clips in the upper
abdomen.
IMPRESSION: No active cardiopulmonary disease.

## 2018-01-16 DIAGNOSIS — J3081 Allergic rhinitis due to animal (cat) (dog) hair and dander: Secondary | ICD-10-CM | POA: Diagnosis not present

## 2018-01-16 DIAGNOSIS — J301 Allergic rhinitis due to pollen: Secondary | ICD-10-CM | POA: Diagnosis not present

## 2018-01-16 DIAGNOSIS — J3089 Other allergic rhinitis: Secondary | ICD-10-CM | POA: Diagnosis not present

## 2018-01-25 DIAGNOSIS — J301 Allergic rhinitis due to pollen: Secondary | ICD-10-CM | POA: Diagnosis not present

## 2018-01-25 DIAGNOSIS — J3081 Allergic rhinitis due to animal (cat) (dog) hair and dander: Secondary | ICD-10-CM | POA: Diagnosis not present

## 2018-01-25 DIAGNOSIS — J3089 Other allergic rhinitis: Secondary | ICD-10-CM | POA: Diagnosis not present

## 2018-01-31 DIAGNOSIS — J301 Allergic rhinitis due to pollen: Secondary | ICD-10-CM | POA: Diagnosis not present

## 2018-01-31 DIAGNOSIS — J3081 Allergic rhinitis due to animal (cat) (dog) hair and dander: Secondary | ICD-10-CM | POA: Diagnosis not present

## 2018-01-31 DIAGNOSIS — J3089 Other allergic rhinitis: Secondary | ICD-10-CM | POA: Diagnosis not present

## 2018-02-03 DIAGNOSIS — J3081 Allergic rhinitis due to animal (cat) (dog) hair and dander: Secondary | ICD-10-CM | POA: Diagnosis not present

## 2018-02-03 DIAGNOSIS — J301 Allergic rhinitis due to pollen: Secondary | ICD-10-CM | POA: Diagnosis not present

## 2018-02-03 DIAGNOSIS — J3089 Other allergic rhinitis: Secondary | ICD-10-CM | POA: Diagnosis not present

## 2018-02-15 DIAGNOSIS — J301 Allergic rhinitis due to pollen: Secondary | ICD-10-CM | POA: Diagnosis not present

## 2018-02-15 DIAGNOSIS — J3089 Other allergic rhinitis: Secondary | ICD-10-CM | POA: Diagnosis not present

## 2018-02-15 DIAGNOSIS — J3081 Allergic rhinitis due to animal (cat) (dog) hair and dander: Secondary | ICD-10-CM | POA: Diagnosis not present

## 2018-02-20 ENCOUNTER — Ambulatory Visit: Payer: Medicare Other | Admitting: Sports Medicine

## 2018-02-20 ENCOUNTER — Encounter: Payer: Self-pay | Admitting: Sports Medicine

## 2018-02-20 VITALS — BP 104/78 | HR 71 | Ht 61.0 in | Wt 171.4 lb

## 2018-02-20 DIAGNOSIS — M24661 Ankylosis, right knee: Secondary | ICD-10-CM

## 2018-02-20 DIAGNOSIS — J3089 Other allergic rhinitis: Secondary | ICD-10-CM | POA: Diagnosis not present

## 2018-02-20 DIAGNOSIS — M25561 Pain in right knee: Secondary | ICD-10-CM | POA: Diagnosis not present

## 2018-02-20 DIAGNOSIS — J301 Allergic rhinitis due to pollen: Secondary | ICD-10-CM | POA: Diagnosis not present

## 2018-02-20 DIAGNOSIS — M25551 Pain in right hip: Secondary | ICD-10-CM | POA: Diagnosis not present

## 2018-02-20 DIAGNOSIS — Z96651 Presence of right artificial knee joint: Secondary | ICD-10-CM | POA: Diagnosis not present

## 2018-02-20 DIAGNOSIS — J3081 Allergic rhinitis due to animal (cat) (dog) hair and dander: Secondary | ICD-10-CM | POA: Diagnosis not present

## 2018-02-20 LAB — COMPREHENSIVE METABOLIC PANEL
ALT: 11 U/L (ref 0–35)
AST: 15 U/L (ref 0–37)
Albumin: 3.9 g/dL (ref 3.5–5.2)
Alkaline Phosphatase: 91 U/L (ref 39–117)
BUN: 16 mg/dL (ref 6–23)
CO2: 32 meq/L (ref 19–32)
Calcium: 9.8 mg/dL (ref 8.4–10.5)
Chloride: 104 mEq/L (ref 96–112)
Creatinine, Ser: 0.86 mg/dL (ref 0.40–1.20)
GFR: 68.5 mL/min (ref >60.00)
GLUCOSE: 101 mg/dL — AB (ref 70–99)
Potassium: 4.3 mEq/L (ref 3.5–5.1)
Sodium: 140 mEq/L (ref 135–145)
Total Bilirubin: 0.5 mg/dL (ref 0.2–1.2)
Total Protein: 7.1 g/dL (ref 6.0–8.3)

## 2018-02-20 LAB — CBC WITH DIFFERENTIAL/PLATELET
BASOS ABS: 0.1 10*3/uL (ref 0.0–0.1)
Basophils Relative: 1 % (ref 0.0–3.0)
Eosinophils Absolute: 0.4 10*3/uL (ref 0.0–0.7)
Eosinophils Relative: 5.2 % — ABNORMAL HIGH (ref 0.0–5.0)
HCT: 41 % (ref 36.0–46.0)
Hemoglobin: 13.7 g/dL (ref 12.0–15.0)
LYMPHS ABS: 1.4 10*3/uL (ref 0.7–4.0)
Lymphocytes Relative: 19.2 % (ref 12.0–46.0)
MCHC: 33.4 g/dL (ref 30.0–36.0)
MCV: 88.5 fl (ref 78.0–100.0)
MONOS PCT: 6.9 % (ref 3.0–12.0)
Monocytes Absolute: 0.5 10*3/uL (ref 0.1–1.0)
NEUTROS ABS: 4.8 10*3/uL (ref 1.4–7.7)
NEUTROS PCT: 67.7 % (ref 43.0–77.0)
PLATELETS: 265 10*3/uL (ref 150.0–400.0)
RBC: 4.64 Mil/uL (ref 3.87–5.11)
RDW: 14.7 % (ref 11.5–15.5)
WBC: 7.1 10*3/uL (ref 4.0–10.5)

## 2018-02-20 LAB — C-REACTIVE PROTEIN: CRP: 0.5 mg/dL (ref 0.5–20.0)

## 2018-02-20 LAB — SEDIMENTATION RATE: Sed Rate: 26 mm/hr (ref 0–30)

## 2018-02-20 NOTE — Progress Notes (Signed)
Aimee Lawrence. Aimee Lawrence, [name] [phone]  Aimee Lawrence - 72 y.o. female MRN [id number]  Date of birth: 07/26/1945  Visit Date: 02/20/2018  PCP: [name], MD   Referred by: [name], MD  Scribe(s) for today's visit: [name], CMA  SUBJECTIVE:  Aimee Lawrence is here for Follow-up (R hip, R knee)   08/22/17: Her R knee and hip pain symptoms INITIALLY: Began about 3 years ago when her knee gave way while helping some friends move items up and down stairs. Described as a stabbing pain 4/10 pain at rest that increases to a 5/10 pain at it's worst  , radiating to R hip.  R hip pain began about a year ago. Worsened with walking and prolonged standing Improved with Tramadol Additional associated symptoms include: numbness in the R ant knee since surgery; some popping and clicking and swelling in the R knee  08/29/17: Compared to the last office visit, her previously described symptoms show no change  Current symptoms are moderate & are radiating to RT leg and gluteal region. She denies pain in the lower back or grain. She continues to notice swelling around the RT knee. She also noticed occasional catching, feels the knee will give out on her. She has not fallen d/t this.  She has been taking Tramadol with minimal relief. She has had closed knee manipulation in the past.  Hx of RT TKR.   10/10/17: Compared to the last office visit, her previously described symptoms are worsening. About a week ago she noticed increased pain and catching in her R hip. She has noticed increased swelling around the R knee. She started ambulating with a cane last week d/t difficulty walking.  Current symptoms are severe & are non-radiating. She has quick sharp pain in the knee with certain movements.  She has been taking Singulair BID and this has been helping with allergies but not her knee. She has been taking Tramadol prn with minimal relief.  She has also been taking Tylenol prn with minimal relief.  She has a patch test done which showed that she was not allergic to nickel.   11/28/2017: Compared to the last office visit, her previously described symptoms are improving. She got relief from pain after injection but sx are starting to return.  Current symptoms are moderate & are non-radiating. She has pain with weight bearing and walking. Today she is having mild pain while sitting.  She has been taking Tylenol and Tramadol prn with some relief. She was advised to continue taking Singulair daily.  She received intra-articular injection 10/10/2017 and responded well.  She also c/o constant swelling in R knee.   01/09/2018: Compared to the last office visit on 11/28/17, her previously described R hip and R knee pain symptoms show no change. Current symptoms are moderate & are nonradiating She has been taking Tramadol, Tylenol and Singulair.  She started on a nitro protocol but doesn't feel that the nitro patches are helping.  She had a R hip injection on 10/10/17 and responded well to that.  02/20/2018: Compared to the last office visit, her previously described symptoms show no change. Some days it feels like the R knee is worse.  Current symptoms are moderate-severe & are radiating into her legs. She reports swelling around the knee. She has noticed LBP.  She has been taking tramadol and Tylenol with minimal relief. She has been following Nitro Protocol, 1/2  patch daily, and hasn't noticed much change in sx. She denies HA since starting Nitro Protocol.    REVIEW OF SYSTEMS: Reports night time disturbances. Denies fevers, chills, or night sweats. Denies unexplained weight loss. Denies personal history of cancer. Denies changes in bowel or bladder habits. Denies recent unreported falls. Denies new or worsening dyspnea or wheezing. Denies headaches or dizziness.  Reports weakness in legs.  Denies dizziness or presyncopal  episodes Reports lower extremity edema    HISTORY & PERTINENT PRIOR DATA:  Prior History reviewed and updated per electronic medical record.  Significant/pertinent history, findings, studies include:  reports that she has never smoked. She has never used smokeless tobacco. No results for input(s): HGBA1C, LABURIC, CREATINE in the last 8760 hours. No specialty comments available. No problems updated.  OBJECTIVE:  VS:  HT:5\' 1"  (154.9 cm)   WT:171 lb 6.4 oz (77.7 kg)  BMI:32.4    BP:104/78  HR:71bpm  TEMP: ( )  RESP:98 %   PHYSICAL EXAM: CONSTITUTIONAL: Well-developed, Well-nourished and In no acute distress Alert & appropriately interactive. and Not depressed or anxious appearing. Respiratory: No increased work of breathing.  Trachea Midline EYES: Pupils are equal., EOM intact without nystagmus. and No scleral icterus.  Lower EXTREMITY EXAM: Warm and well perfused Pulses: DP Pulses: Bilaterally normal and symmetric PT Pulses: Bilaterally normal and symmetric Edema: No significant swelling or edema NEURO: unremarkable, Normal associated myotomal distribution strength to manual muscle testing, Normal sensation to light touch  MSK Exam: Right hip:  Inspection: Well aligned without significant deformity. Palpation: No focal TTP over the greater trochanter. Range of Motion: Good internal and external rotation of the hip. Special tests: Negative FADIR and Stinchfield test.   Right knee: Marland Kitchen Well aligned, no significant deformity.  . Well-healed postsurgical incision. . Generalized tenderness to palpation . Extensor mechanism intact . Ligamentously stable to Varus and valgus strain . No significant effusion.  Slightly warm to touch   PROCEDURES & DATA REVIEWED:  none  ASSESSMENT   1. Recurrent pain of right knee   2. S/P TKR (total knee replacement), right   3. Arthrofibrosis of knee joint, right   4. Right hip pain     PLAN:  This is been going on for quite some  time.  The findings that were previously on her bone scan were inconclusive and pursuing further diagnostic evaluation of her right knee with MRI with and without contrast will be pursued.  She is having continued daily pain but does report overall good function from the standpoint of activities of daily living. No problem-specific Assessment & Plan notes found for this encounter.  Orders Placed This Encounter  Procedures  . MR KNEE RIGHT W WO CONTRAST  . CBC with Differential/Platelet  . Comprehensive metabolic panel  . Sedimentation rate  . C-reactive protein     Follow-up: Return for MRI results review.      Please see additional documentation for Objective, Assessment and Plan sections. Pertinent additional documentation may be included in corresponding procedure notes, imaging studies, problem based documentation and patient instructions. Please see these sections of the encounter for additional information regarding this visit.  CMA/ATC served as Education administrator during this visit. History, Physical, and Plan performed by medical provider. Documentation and orders reviewed and attested to.      [name], [facility] Physician

## 2018-02-20 NOTE — Patient Instructions (Signed)

## 2018-02-27 NOTE — Progress Notes (Signed)
Results are overall reassuring and essentially normal slight eosinophilia

## 2018-02-28 ENCOUNTER — Ambulatory Visit
Admission: RE | Admit: 2018-02-28 | Discharge: 2018-02-28 | Disposition: A | Discharge status: Home / Self Care | Payer: Medicare Other | Source: Ambulatory Visit | Attending: Sports Medicine | Admitting: Sports Medicine

## 2018-02-28 DIAGNOSIS — Z96651 Presence of right artificial knee joint: Secondary | ICD-10-CM | POA: Diagnosis not present

## 2018-02-28 DIAGNOSIS — Z471 Aftercare following joint replacement surgery: Secondary | ICD-10-CM | POA: Diagnosis not present

## 2018-02-28 DIAGNOSIS — M25561 Pain in right knee: Secondary | ICD-10-CM

## 2018-02-28 MED ORDER — GADOBENATE DIMEGLUMINE 529 MG/ML IV SOLN
15.0000 mL | Freq: Once | INTRAVENOUS | Status: AC | PRN
  Administered 2018-02-28: 15 mL via INTRAVENOUS

## 2018-02-28 NOTE — Progress Notes (Signed)
Results are reassuring.  No further recommendation to be made off of these results.  She was supposed to schedule a follow-up appointment to review these results in person.

## 2018-03-03 ENCOUNTER — Encounter: Payer: Self-pay | Admitting: Physical Therapy

## 2018-03-03 DIAGNOSIS — J3089 Other allergic rhinitis: Secondary | ICD-10-CM | POA: Diagnosis not present

## 2018-03-03 DIAGNOSIS — J3081 Allergic rhinitis due to animal (cat) (dog) hair and dander: Secondary | ICD-10-CM | POA: Diagnosis not present

## 2018-03-03 DIAGNOSIS — J301 Allergic rhinitis due to pollen: Secondary | ICD-10-CM | POA: Diagnosis not present

## 2018-03-03 NOTE — Progress Notes (Signed)
Pt called 3x with no answer and unable to leave a message due to her voicemail being full.  Sent her a letter through [employer] reiterating Dr. Nicolasa Ducking results and explaining that I've called her 3x with no response. She may schedule a f/u at her discretion.

## 2018-03-06 ENCOUNTER — Encounter: Payer: Self-pay | Admitting: Sports Medicine

## 2018-03-06 ENCOUNTER — Ambulatory Visit: Payer: Medicare Other | Admitting: Sports Medicine

## 2018-03-06 VITALS — BP 124/78 | HR 75 | Ht 61.0 in | Wt 171.8 lb

## 2018-03-06 DIAGNOSIS — M1611 Unilateral primary osteoarthritis, right hip: Secondary | ICD-10-CM | POA: Diagnosis not present

## 2018-03-06 DIAGNOSIS — Z96651 Presence of right artificial knee joint: Secondary | ICD-10-CM

## 2018-03-06 DIAGNOSIS — M25561 Pain in right knee: Secondary | ICD-10-CM

## 2018-03-06 DIAGNOSIS — M25551 Pain in right hip: Secondary | ICD-10-CM | POA: Diagnosis not present

## 2018-03-06 DIAGNOSIS — M24661 Ankylosis, right knee: Secondary | ICD-10-CM | POA: Diagnosis not present

## 2018-03-06 NOTE — Progress Notes (Signed)
Aimee Lawrence. Aimee Lawrence, [place] at [facility] [phone]  Aimee Lawrence - 72 y.o. female MRN [id number]  Date of birth: May 14, 1946  Visit Date: 03/06/2018  PCP: Aimee Morale, MD   Referred by: Aimee Morale, MD  Scribe(s) for today's visit: Wendy Poet, LAT, ATC  SUBJECTIVE:  Aimee Lawrence is here for Follow-up (R knee pain and MRI f/u) .    08/22/17: Her R knee and hip pain symptoms INITIALLY: Began about 3 years ago when her knee gave way while helping some friends move items up and down stairs. Described as a stabbing pain 4/10 pain at rest that increases to a 5/10 pain at it's worst  , radiating to R hip.  R hip pain began about a year ago. Worsened with walking and prolonged standing Improved with Tramadol Additional associated symptoms include: numbness in the R ant knee since surgery; some popping and clicking and swelling in the R knee  08/29/17: Compared to the last office visit, her previously described symptoms show no change  Current symptoms are moderate & are radiating to RT leg and gluteal region. She denies pain in the lower back or grain. She continues to notice swelling around the RT knee. She also noticed occasional catching, feels the knee will give out on her. She has not fallen d/t this.  She has been taking Tramadol with minimal relief. She has had closed knee manipulation in the past.  Hx of RT TKR.   10/10/17: Compared to the last office visit, her previously described symptoms are worsening. About a week ago she noticed increased pain and catching in her R hip. She has noticed increased swelling around the R knee. She started ambulating with a cane last week d/t difficulty walking.  Current symptoms are severe & are non-radiating. She has quick sharp pain in the knee with certain movements.  She has been taking Singulair BID and this has been helping with allergies but not her knee. She has been taking Tramadol prn with  minimal relief. She has also been taking Tylenol prn with minimal relief.  She has a patch test done which showed that she was not allergic to nickel.   11/28/2017: Compared to the last office visit, her previously described symptoms are improving. She got relief from pain after injection but sx are starting to return.  Current symptoms are moderate & are non-radiating. She has pain with weight bearing and walking. Today she is having mild pain while sitting.  She has been taking Tylenol and Tramadol prn with some relief. She was advised to continue taking Singulair daily.  She received intra-articular injection 10/10/2017 and responded well.  She also c/o constant swelling in R knee.   01/09/2018: Compared to the last office visit on 11/28/17, her previously described R hip and R knee pain symptoms show no change. Current symptoms are moderate & are nonradiating She has been taking Tramadol, Tylenol and Singulair.  She started on a nitro protocol but doesn't feel that the nitro patches are helping.  She had a R hip injection on 10/10/17 and responded well to that.  02/20/2018: Compared to the last office visit, her previously described symptoms show no change. Some days it feels like the R knee is worse.  Current symptoms are moderate-severe & are radiating into her legs. She reports swelling around the knee. She has noticed LBP.  She has been taking tramadol and Tylenol with minimal relief. She has  been following Nitro Protocol, 1/2 patch daily, and hasn't noticed much change in sx. She denies HA since starting Nitro Protocol.   03/06/2018: Compared to the last office visit on 02/20/18, her previously described R knee pain symptoms are improving today but overall her symptoms are off and on. Current symptoms are moderate & are nonradiating.  She notes some mild swelling but reports that this is no more than usual. She has been taking tramadol and Tylenol with minimal relief. She has been following  Nitro Protocol, 1/2 patch daily, and hasn't noticed much change in sx. She denies HA since starting Nitro Protocol.   R knee MRI - 02/28/18   REVIEW OF SYSTEMS: Denies night time disturbances. Denies fevers, chills, or night sweats. Denies unexplained weight loss. Denies personal history of cancer. Denies changes in bowel or bladder habits. Denies recent unreported falls. Denies new or worsening dyspnea or wheezing. Denies headaches or dizziness.  Reports numbness, tingling or weakness  In the extremities - weakness in R LE Denies dizziness or presyncopal episodes Reports lower extremity edema  - in the R knee    HISTORY & PERTINENT PRIOR DATA:  Significant/pertinent history, findings, studies include:  reports that she has never smoked. She has never used smokeless tobacco. No results for input(s): HGBA1C, LABURIC, CREATINE in the last 8760 hours. No specialty comments available. No problems updated.  Otherwise prior history reviewed and updated per electronic medical record.    OBJECTIVE:  VS:  HT:5\' 1"  (154.9 cm)   WT:171 lb 12.8 oz (77.9 kg)  BMI:32.48    BP:124/78  HR:75bpm  TEMP: ( )  RESP:96 %   PHYSICAL EXAM: CONSTITUTIONAL: Well-developed, Well-nourished and In no acute distress Alert & appropriately interactive. and Not depressed or anxious appearing. RESPIRATORY: No increased work of breathing and Trachea Midline EYES: Pupils are equal., EOM intact without nystagmus. and No scleral icterus.  Lower extremities: Warm and well perfused Pulses: DP Pulses: Bilaterally normal and symmetric Edema: No significant swelling or edema NEURO: unremarkable  MSK Exam: Right lower extremity: . Well aligned, no significant deformity. Marland Kitchen Postsurgical incision well-healed. . TTP over Diffusely over the knee and anterior leg . Good range of motion of the knee although limited with knee flexion.  She has good internal and external rotation of bilateral hips with only  minimal pain with terminal internal rotation. . Ligamentously stable to Varus and valgus strain   PROCEDURES & DATA REVIEWED:  . Outside/prior images reviewed today with the patient that showed MRI of the knee that was overall reassuring for no significant loosening or evidence of significant infection.  ASSESSMENT   1. Recurrent pain of right knee   2. S/P TKR (total knee replacement), right   3. Arthrofibrosis of knee joint, right   4. Right hip pain   5. Primary osteoarthritis of right hip     PLAN:       .  Marland Kitchen Referral to physical therapy placed today. . Can consider dry needling versus other therapeutic interventions . Would recommend pursuing further work-up of her hip if persistent ongoing symptoms and consider repeat right hip intra-articular injection.  She has had good improvement in her groin symptoms but still no change in the knee pain.  I doubt that a total hip arthroplasty would actually provide her significant benefit with the right knee pain. No problem-specific Assessment & Plan notes found for this encounter.   Follow-up: Return if symptoms worsen or fail to improve, for as needed for ongoing issues.  Please see additional documentation for Objective, Assessment and Plan sections. Pertinent additional documentation may be included in corresponding procedure notes, imaging studies, problem based documentation and patient instructions. Please see these sections of the encounter for additional information regarding this visit.  CMA/ATC served as Education administrator during this visit. History, Physical, and Plan performed by medical provider. Documentation and orders reviewed and attested to.      [name], [facility] Physician

## 2018-03-06 NOTE — Patient Instructions (Signed)
I am going to refer you to integrative therapies for physical therapy and acupuncture.  Let me know if anything worsens and we can always consider reinjecting your hip.

## 2018-03-08 DIAGNOSIS — J3081 Allergic rhinitis due to animal (cat) (dog) hair and dander: Secondary | ICD-10-CM | POA: Diagnosis not present

## 2018-03-08 DIAGNOSIS — J3089 Other allergic rhinitis: Secondary | ICD-10-CM | POA: Diagnosis not present

## 2018-03-08 DIAGNOSIS — J301 Allergic rhinitis due to pollen: Secondary | ICD-10-CM | POA: Diagnosis not present

## 2018-03-10 ENCOUNTER — Encounter: Payer: Self-pay | Admitting: Sports Medicine

## 2018-03-31 DIAGNOSIS — J3081 Allergic rhinitis due to animal (cat) (dog) hair and dander: Secondary | ICD-10-CM | POA: Diagnosis not present

## 2018-03-31 DIAGNOSIS — J301 Allergic rhinitis due to pollen: Secondary | ICD-10-CM | POA: Diagnosis not present

## 2018-03-31 DIAGNOSIS — J3089 Other allergic rhinitis: Secondary | ICD-10-CM | POA: Diagnosis not present

## 2018-04-07 DIAGNOSIS — J3089 Other allergic rhinitis: Secondary | ICD-10-CM | POA: Diagnosis not present

## 2018-04-07 DIAGNOSIS — J301 Allergic rhinitis due to pollen: Secondary | ICD-10-CM | POA: Diagnosis not present

## 2018-04-07 DIAGNOSIS — H43812 Vitreous degeneration, left eye: Secondary | ICD-10-CM | POA: Diagnosis not present

## 2018-04-07 DIAGNOSIS — J3081 Allergic rhinitis due to animal (cat) (dog) hair and dander: Secondary | ICD-10-CM | POA: Diagnosis not present

## 2018-04-10 DIAGNOSIS — J3081 Allergic rhinitis due to animal (cat) (dog) hair and dander: Secondary | ICD-10-CM | POA: Diagnosis not present

## 2018-04-10 DIAGNOSIS — J301 Allergic rhinitis due to pollen: Secondary | ICD-10-CM | POA: Diagnosis not present

## 2018-04-10 DIAGNOSIS — J3089 Other allergic rhinitis: Secondary | ICD-10-CM | POA: Diagnosis not present

## 2018-04-12 DIAGNOSIS — J3089 Other allergic rhinitis: Secondary | ICD-10-CM | POA: Diagnosis not present

## 2018-04-12 DIAGNOSIS — J3081 Allergic rhinitis due to animal (cat) (dog) hair and dander: Secondary | ICD-10-CM | POA: Diagnosis not present

## 2018-04-12 DIAGNOSIS — J301 Allergic rhinitis due to pollen: Secondary | ICD-10-CM | POA: Diagnosis not present

## 2018-04-19 DIAGNOSIS — J3089 Other allergic rhinitis: Secondary | ICD-10-CM | POA: Diagnosis not present

## 2018-04-19 DIAGNOSIS — J3081 Allergic rhinitis due to animal (cat) (dog) hair and dander: Secondary | ICD-10-CM | POA: Diagnosis not present

## 2018-04-19 DIAGNOSIS — J301 Allergic rhinitis due to pollen: Secondary | ICD-10-CM | POA: Diagnosis not present

## 2018-04-26 DIAGNOSIS — J301 Allergic rhinitis due to pollen: Secondary | ICD-10-CM | POA: Diagnosis not present

## 2018-04-26 DIAGNOSIS — J3089 Other allergic rhinitis: Secondary | ICD-10-CM | POA: Diagnosis not present

## 2018-04-26 DIAGNOSIS — J3081 Allergic rhinitis due to animal (cat) (dog) hair and dander: Secondary | ICD-10-CM | POA: Diagnosis not present

## 2018-05-03 DIAGNOSIS — J3089 Other allergic rhinitis: Secondary | ICD-10-CM | POA: Diagnosis not present

## 2018-05-03 DIAGNOSIS — J3081 Allergic rhinitis due to animal (cat) (dog) hair and dander: Secondary | ICD-10-CM | POA: Diagnosis not present

## 2018-05-03 DIAGNOSIS — J301 Allergic rhinitis due to pollen: Secondary | ICD-10-CM | POA: Diagnosis not present

## 2018-05-10 DIAGNOSIS — J301 Allergic rhinitis due to pollen: Secondary | ICD-10-CM | POA: Diagnosis not present

## 2018-05-10 DIAGNOSIS — J3081 Allergic rhinitis due to animal (cat) (dog) hair and dander: Secondary | ICD-10-CM | POA: Diagnosis not present

## 2018-05-10 DIAGNOSIS — J3089 Other allergic rhinitis: Secondary | ICD-10-CM | POA: Diagnosis not present

## 2018-05-24 DIAGNOSIS — J3089 Other allergic rhinitis: Secondary | ICD-10-CM | POA: Diagnosis not present

## 2018-05-24 DIAGNOSIS — J3081 Allergic rhinitis due to animal (cat) (dog) hair and dander: Secondary | ICD-10-CM | POA: Diagnosis not present

## 2018-05-24 DIAGNOSIS — J301 Allergic rhinitis due to pollen: Secondary | ICD-10-CM | POA: Diagnosis not present

## 2018-05-31 DIAGNOSIS — J4520 Mild intermittent asthma, uncomplicated: Secondary | ICD-10-CM | POA: Diagnosis not present

## 2018-05-31 DIAGNOSIS — J3089 Other allergic rhinitis: Secondary | ICD-10-CM | POA: Diagnosis not present

## 2018-05-31 DIAGNOSIS — J3081 Allergic rhinitis due to animal (cat) (dog) hair and dander: Secondary | ICD-10-CM | POA: Diagnosis not present

## 2018-05-31 DIAGNOSIS — J301 Allergic rhinitis due to pollen: Secondary | ICD-10-CM | POA: Diagnosis not present

## 2018-06-07 DIAGNOSIS — J3081 Allergic rhinitis due to animal (cat) (dog) hair and dander: Secondary | ICD-10-CM | POA: Diagnosis not present

## 2018-06-07 DIAGNOSIS — J301 Allergic rhinitis due to pollen: Secondary | ICD-10-CM | POA: Diagnosis not present

## 2018-06-07 DIAGNOSIS — J3089 Other allergic rhinitis: Secondary | ICD-10-CM | POA: Diagnosis not present

## 2018-06-18 ENCOUNTER — Other Ambulatory Visit: Payer: Self-pay | Admitting: Family Medicine

## 2018-06-21 DIAGNOSIS — J301 Allergic rhinitis due to pollen: Secondary | ICD-10-CM | POA: Diagnosis not present

## 2018-06-21 DIAGNOSIS — J3089 Other allergic rhinitis: Secondary | ICD-10-CM | POA: Diagnosis not present

## 2018-06-21 DIAGNOSIS — J3081 Allergic rhinitis due to animal (cat) (dog) hair and dander: Secondary | ICD-10-CM | POA: Diagnosis not present

## 2018-06-21 NOTE — Telephone Encounter (Signed)
Patient would like for her Rx   traMADol (ULTRAM) 50 MG tablet   Sent to:   [facility] [address], [place] [phone] (Phone) [phone] (Fax)

## 2018-06-23 NOTE — Telephone Encounter (Signed)
Pt checking status on tramadol refill. Pt is out of medication.

## 2018-06-26 NOTE — Telephone Encounter (Signed)
done

## 2018-06-26 NOTE — Telephone Encounter (Signed)
Dr. Fry please advise on refills. Thanks  

## 2018-06-26 NOTE — Telephone Encounter (Signed)
Copied from [place] [phone]. Topic: Quick Communication - Rx Refill/Question >> Jun 26, 2018  1:32 PM Windy Kalata wrote: Medication: traMADol (ULTRAM) 50 MG tablet     Has the patient contacted their pharmacy? Yes.   (Agent: If no, request that the patient contact the pharmacy for the refill.) (Agent: If yes, when and what did the pharmacy advise?) They faxed over request to office for refill last Tuesday and Wednesday  [facility] [employer] [address] [place], [place] [phone] (Phone) [phone] (Fax)   Preferred Pharmacy (with phone number or street name):   Agent: Please be advised that RX refills may take up to 3 business days. We ask that you follow-up with your pharmacy.

## 2018-06-26 NOTE — Telephone Encounter (Signed)
Requested medication (s) are due for refill today: yes  Requested medication (s) are on the active medication list: yes    Last refill: 12/13/17  #180  5 refills  Future visit scheduled yes  07/10/18  Dr. Sarajane Jews  Notes to clinic:not delegated  Requested Prescriptions  Pending Prescriptions Disp Refills   traMADol (ULTRAM) 50 MG tablet [Pharmacy Med Name: traMADol HCL 50MG  TABLET] 180 tablet 4    Sig: TAKE TWO TABLETS BY MOUTH EVERY 8 HOURS AS NEEDED FOR MODERATE PAIN     Not Delegated - Analgesics:  Opioid Agonists Failed - 06/26/2018  1:34 PM      Failed - This refill cannot be delegated      Failed - Urine Drug Screen completed in last 360 days.      Passed - Valid encounter within last 6 months    Recent Outpatient Visits          3 months ago Recurrent pain of right knee   [place] [facility], [place] D, DO   4 months ago Recurrent pain of right knee   [facility] [place], Legrand Como D, DO   5 months ago Right hip pain   [facility] [place], Legrand Como D, DO   7 months ago Right hip pain   [facility] [place], Legrand Como D, DO   8 months ago Right hip pain   [facility] [place], [name], DO      Future Appointments            In 2 weeks [name], MD [employer] at [place], [facility]

## 2018-06-26 NOTE — Telephone Encounter (Signed)
Call in #[phone] with 5 rf 

## 2018-06-28 DIAGNOSIS — J3081 Allergic rhinitis due to animal (cat) (dog) hair and dander: Secondary | ICD-10-CM | POA: Diagnosis not present

## 2018-06-28 DIAGNOSIS — J301 Allergic rhinitis due to pollen: Secondary | ICD-10-CM | POA: Diagnosis not present

## 2018-06-28 DIAGNOSIS — J3089 Other allergic rhinitis: Secondary | ICD-10-CM | POA: Diagnosis not present

## 2018-07-10 ENCOUNTER — Encounter: Payer: Self-pay | Admitting: Family Medicine

## 2018-07-10 ENCOUNTER — Ambulatory Visit: Payer: Medicare Other | Admitting: Family Medicine

## 2018-07-10 VITALS — BP 110/68 | HR 70 | Temp 98.2°F | Ht 60.0 in | Wt 153.0 lb

## 2018-07-10 DIAGNOSIS — M25551 Pain in right hip: Secondary | ICD-10-CM | POA: Diagnosis not present

## 2018-07-10 DIAGNOSIS — M1711 Unilateral primary osteoarthritis, right knee: Secondary | ICD-10-CM

## 2018-07-10 DIAGNOSIS — J4520 Mild intermittent asthma, uncomplicated: Secondary | ICD-10-CM

## 2018-07-10 DIAGNOSIS — M8580 Other specified disorders of bone density and structure, unspecified site: Secondary | ICD-10-CM

## 2018-07-10 DIAGNOSIS — J3081 Allergic rhinitis due to animal (cat) (dog) hair and dander: Secondary | ICD-10-CM | POA: Diagnosis not present

## 2018-07-10 DIAGNOSIS — I10 Essential (primary) hypertension: Secondary | ICD-10-CM | POA: Diagnosis not present

## 2018-07-10 DIAGNOSIS — J301 Allergic rhinitis due to pollen: Secondary | ICD-10-CM | POA: Diagnosis not present

## 2018-07-10 DIAGNOSIS — J3089 Other allergic rhinitis: Secondary | ICD-10-CM | POA: Diagnosis not present

## 2018-07-10 LAB — HEPATIC FUNCTION PANEL
ALBUMIN: 4.2 g/dL (ref 3.5–5.2)
ALT: 18 U/L (ref 0–35)
AST: 19 U/L (ref 0–37)
Alkaline Phosphatase: 120 U/L — ABNORMAL HIGH (ref 39–117)
Bilirubin, Direct: 0.1 mg/dL (ref 0.0–0.3)
TOTAL PROTEIN: 6.9 g/dL (ref 6.0–8.3)
Total Bilirubin: 0.4 mg/dL (ref 0.2–1.2)

## 2018-07-10 LAB — POC URINALSYSI DIPSTICK (AUTOMATED)
GLUCOSE UA: NEGATIVE
Leukocytes, UA: NEGATIVE
Nitrite, UA: NEGATIVE
Protein, UA: POSITIVE — AB
RBC UA: NEGATIVE
Spec Grav, UA: >=1.03 — AB (ref 1.010–1.025)
Urobilinogen, UA: 0.2 E.U./dL
pH, UA: 6 (ref 5.0–8.0)

## 2018-07-10 LAB — BASIC METABOLIC PANEL
BUN: 15 mg/dL (ref 6–23)
CO2: 33 meq/L — AB (ref 19–32)
Calcium: 9.9 mg/dL (ref 8.4–10.5)
Chloride: 102 mEq/L (ref 96–112)
Creatinine, Ser: 0.77 mg/dL (ref 0.40–1.20)
GFR: 77.74 mL/min (ref >60.00)
GLUCOSE: 98 mg/dL (ref 70–99)
POTASSIUM: 4.4 meq/L (ref 3.5–5.1)
Sodium: 141 mEq/L (ref 135–145)

## 2018-07-10 LAB — CBC WITH DIFFERENTIAL/PLATELET
Basophils Absolute: 0.1 K/uL (ref 0.0–0.1)
Basophils Relative: 0.9 % (ref 0.0–3.0)
Eosinophils Absolute: 0.2 K/uL (ref 0.0–0.7)
Eosinophils Relative: 3.7 % (ref 0.0–5.0)
HCT: 40.1 % (ref 36.0–46.0)
Hemoglobin: 13.5 g/dL (ref 12.0–15.0)
Lymphocytes Relative: 28.4 % (ref 12.0–46.0)
Lymphs Abs: 1.8 K/uL (ref 0.7–4.0)
MCHC: 33.6 g/dL (ref 30.0–36.0)
MCV: 92.2 fl (ref 78.0–100.0)
Monocytes Absolute: 0.6 K/uL (ref 0.1–1.0)
Monocytes Relative: 9.8 % (ref 3.0–12.0)
Neutro Abs: 3.6 K/uL (ref 1.4–7.7)
Neutrophils Relative %: 57.2 % (ref 43.0–77.0)
Platelets: 276 K/uL (ref 150.0–400.0)
RBC: 4.35 Mil/uL (ref 3.87–5.11)
RDW: 14.6 % (ref 11.5–15.5)
WBC: 6.3 K/uL (ref 4.0–10.5)

## 2018-07-10 LAB — LIPID PANEL
CHOLESTEROL: 192 mg/dL (ref 0–200)
HDL: 54.3 mg/dL (ref >39.00)
LDL Cholesterol: 119 mg/dL — ABNORMAL HIGH (ref 0–99)
NonHDL: 137.47
Total CHOL/HDL Ratio: 4
Triglycerides: 94 mg/dL (ref 0.0–149.0)
VLDL: 18.8 mg/dL (ref 0.0–40.0)

## 2018-07-10 LAB — TSH: TSH: 1.78 u[IU]/mL (ref 0.35–4.50)

## 2018-07-10 NOTE — Progress Notes (Signed)
   Subjective:    Patient ID: Aimee Lawrence, female    DOB: January 12, 1946, 72 y.o.   MRN: [id number]  HPI Here to follow up on issues. Her BP has been stable. Her asthma has been well controlled. She still has trouble with right knee and hip pain. She takes Tramadol with mixed results. She last saw [name] for this in August. She tried some PT and she went to [facility] with mixed results.    Review of Systems  Constitutional: Negative.   HENT: Negative.   Eyes: Negative.   Respiratory: Negative.   Cardiovascular: Negative.   Gastrointestinal: Negative.   Genitourinary: Negative for decreased urine volume, difficulty urinating, dyspareunia, dysuria, enuresis, flank pain, frequency, hematuria, pelvic pain and urgency.  Musculoskeletal: Positive for arthralgias.  Skin: Negative.   Neurological: Negative.   Psychiatric/Behavioral: Negative.        Objective:   Physical Exam Constitutional:      General: She is not in acute distress.    Appearance: She is well-developed.  HENT:     Head: Normocephalic and atraumatic.     Right Ear: External ear normal.     Left Ear: External ear normal.     Nose: Nose normal.     Mouth/Throat:     Pharynx: No oropharyngeal exudate.  Eyes:     General: No scleral icterus.    Conjunctiva/sclera: Conjunctivae normal.     Pupils: Pupils are equal, round, and reactive to light.  Neck:     Musculoskeletal: Normal range of motion and neck supple.     Thyroid: No thyromegaly.     Vascular: No JVD.  Cardiovascular:     Rate and Rhythm: Normal rate and regular rhythm.     Heart sounds: Normal heart sounds. No murmur. No friction rub. No gallop.   Pulmonary:     Effort: Pulmonary effort is normal. No respiratory distress.     Breath sounds: Normal breath sounds. No wheezing or rales.  Chest:     Chest wall: No tenderness.  Abdominal:     General: Bowel sounds are normal. There is no distension.     Palpations: Abdomen is soft.  There is no mass.     Tenderness: There is no abdominal tenderness. There is no guarding or rebound.  Musculoskeletal: Normal range of motion.        General: No tenderness.  Lymphadenopathy:     Cervical: No cervical adenopathy.  Skin:    General: Skin is warm and dry.     Findings: No erythema or rash.  Neurological:     Mental Status: She is alert and oriented to person, place, and time.     Cranial Nerves: No cranial nerve deficit.     Motor: No abnormal muscle tone.     Coordination: Coordination normal.     Deep Tendon Reflexes: Reflexes are normal and symmetric. Reflexes normal.  Psychiatric:        Behavior: Behavior normal.        Thought Content: Thought content normal.        Judgment: Judgment normal.           Assessment & Plan:  Her HTN and asthma are stable. We will send her for fasting labs. She will get a mammogram. I suggested she follow up with [name] about the knee pain. Alysia Penna, MD

## 2018-07-13 ENCOUNTER — Encounter: Payer: Self-pay | Admitting: *Deleted

## 2018-07-19 DIAGNOSIS — J301 Allergic rhinitis due to pollen: Secondary | ICD-10-CM | POA: Diagnosis not present

## 2018-07-19 DIAGNOSIS — J3081 Allergic rhinitis due to animal (cat) (dog) hair and dander: Secondary | ICD-10-CM | POA: Diagnosis not present

## 2018-07-19 DIAGNOSIS — J3089 Other allergic rhinitis: Secondary | ICD-10-CM | POA: Diagnosis not present

## 2018-07-26 DIAGNOSIS — J3081 Allergic rhinitis due to animal (cat) (dog) hair and dander: Secondary | ICD-10-CM | POA: Diagnosis not present

## 2018-07-26 DIAGNOSIS — J301 Allergic rhinitis due to pollen: Secondary | ICD-10-CM | POA: Diagnosis not present

## 2018-07-26 DIAGNOSIS — J3089 Other allergic rhinitis: Secondary | ICD-10-CM | POA: Diagnosis not present

## 2018-08-02 DIAGNOSIS — J3089 Other allergic rhinitis: Secondary | ICD-10-CM | POA: Diagnosis not present

## 2018-08-02 DIAGNOSIS — J3081 Allergic rhinitis due to animal (cat) (dog) hair and dander: Secondary | ICD-10-CM | POA: Diagnosis not present

## 2018-08-02 DIAGNOSIS — J301 Allergic rhinitis due to pollen: Secondary | ICD-10-CM | POA: Diagnosis not present

## 2018-08-10 ENCOUNTER — Other Ambulatory Visit: Payer: Self-pay | Admitting: Family Medicine

## 2018-08-10 DIAGNOSIS — Z1231 Encounter for screening mammogram for malignant neoplasm of breast: Secondary | ICD-10-CM

## 2018-08-10 DIAGNOSIS — J301 Allergic rhinitis due to pollen: Secondary | ICD-10-CM | POA: Diagnosis not present

## 2018-08-10 DIAGNOSIS — J3089 Other allergic rhinitis: Secondary | ICD-10-CM | POA: Diagnosis not present

## 2018-08-10 DIAGNOSIS — J3081 Allergic rhinitis due to animal (cat) (dog) hair and dander: Secondary | ICD-10-CM | POA: Diagnosis not present

## 2018-08-16 DIAGNOSIS — J3081 Allergic rhinitis due to animal (cat) (dog) hair and dander: Secondary | ICD-10-CM | POA: Diagnosis not present

## 2018-08-16 DIAGNOSIS — J301 Allergic rhinitis due to pollen: Secondary | ICD-10-CM | POA: Diagnosis not present

## 2018-08-16 DIAGNOSIS — J3089 Other allergic rhinitis: Secondary | ICD-10-CM | POA: Diagnosis not present

## 2018-08-23 DIAGNOSIS — J301 Allergic rhinitis due to pollen: Secondary | ICD-10-CM | POA: Diagnosis not present

## 2018-08-23 DIAGNOSIS — J3089 Other allergic rhinitis: Secondary | ICD-10-CM | POA: Diagnosis not present

## 2018-08-23 DIAGNOSIS — J3081 Allergic rhinitis due to animal (cat) (dog) hair and dander: Secondary | ICD-10-CM | POA: Diagnosis not present

## 2018-09-06 DIAGNOSIS — J3089 Other allergic rhinitis: Secondary | ICD-10-CM | POA: Diagnosis not present

## 2018-09-06 DIAGNOSIS — J3081 Allergic rhinitis due to animal (cat) (dog) hair and dander: Secondary | ICD-10-CM | POA: Diagnosis not present

## 2018-09-06 DIAGNOSIS — J301 Allergic rhinitis due to pollen: Secondary | ICD-10-CM | POA: Diagnosis not present

## 2018-09-07 ENCOUNTER — Ambulatory Visit
Admission: RE | Admit: 2018-09-07 | Discharge: 2018-09-07 | Disposition: A | Discharge status: Home / Self Care | Payer: Medicare Other | Source: Ambulatory Visit | Attending: Family Medicine | Admitting: Family Medicine

## 2018-09-07 DIAGNOSIS — Z1231 Encounter for screening mammogram for malignant neoplasm of breast: Secondary | ICD-10-CM | POA: Diagnosis not present

## 2018-09-11 ENCOUNTER — Encounter: Payer: Self-pay | Admitting: Sports Medicine

## 2018-09-11 ENCOUNTER — Ambulatory Visit (INDEPENDENT_AMBULATORY_CARE_PROVIDER_SITE_OTHER): Payer: Medicare Other

## 2018-09-11 ENCOUNTER — Ambulatory Visit: Payer: Medicare Other | Admitting: Sports Medicine

## 2018-09-11 VITALS — BP 104/62 | HR 80 | Ht 60.0 in | Wt 150.0 lb

## 2018-09-11 DIAGNOSIS — M25561 Pain in right knee: Secondary | ICD-10-CM | POA: Diagnosis not present

## 2018-09-11 DIAGNOSIS — M1711 Unilateral primary osteoarthritis, right knee: Secondary | ICD-10-CM

## 2018-09-11 DIAGNOSIS — Z96651 Presence of right artificial knee joint: Secondary | ICD-10-CM | POA: Diagnosis not present

## 2018-09-11 DIAGNOSIS — M25551 Pain in right hip: Secondary | ICD-10-CM

## 2018-09-11 DIAGNOSIS — M24661 Ankylosis, right knee: Secondary | ICD-10-CM | POA: Diagnosis not present

## 2018-09-11 MED ORDER — CELECOXIB 100 MG PO CAPS: 100.0000 mg | ORAL_CAPSULE | Freq: Two times a day (BID) | ORAL | 2 refills | Status: DC | PRN

## 2018-09-11 NOTE — Progress Notes (Signed)
Aimee Lawrence. , [place] at [facility] [phone]  Aimee Lawrence - 73 y.o. female MRN [id number]  Date of birth: 08/12/1945  Visit Date: September 11, 2018  PCP: [name], MD   Referred by: [name], MD  SUBJECTIVE:  Chief Complaint  Patient presents with  . Follow-up    R knee.  Tramadol and Tylenol.  Referred to PT at last visit.    HPI: Patient is here for 5 days of worsening right knee pain.  She overall has been doing quite well following her last office visit she was referred to integrative therapies.  She underwent therapy 7 times pain out-of-pocket and had been working on weight loss.  She has lost up to 25 pounds.  She reports however waking up on Wednesday morning last week and having sharp stabbing pain especially with weightbearing.  Does localize into the right thigh.  No groin pain.  Occasional greater trochanteric pain.  She is tried Tylenol and tramadol.  She is not been able take ibuprofen or Aleve due to prior allergy but she has tolerated Celebrex prior to her total knee arthroplasty.  She is having some right lower extremity swelling that is mild.  She denies any nighttime awakenings due to this issue.  Occasional sharp and catching pain.  REVIEW OF SYSTEMS: No significant nighttime awakenings due to this issue. Denies fevers, chills, recent weight gain or weight loss.  No night sweats.  Pt denies any change in bowel or bladder habits, muscle weakness, numbness or falls associated with this pain.  HISTORY:  Prior history reviewed and updated per electronic medical record.  Patient Active Problem List   Diagnosis Date Noted  . Abnormal finding on imaging 08/29/2017  . Right hip pain 08/15/2017  . S/P TKR (total knee replacement), right 08/11/2017    Per Op Note: "We used the DePuy Attune system and placed size 5 femur, 5 tibia, 35 mm all polyethylene patella, and a size 5 mm spacer." Co-Cr-Mo Implant -  Consider Singulair empiric treatment especially given prior atopy / nasal polyps   . Osteoarthritis of hip 08/11/2017  . Primary localized osteoarthritis of right knee 06/15/2016  . Primary osteoarthritis of right knee 06/15/2016    R knee MRI - 02/28/18    . Nasal polyps 11/20/2012    Sinus Surgery Dec 2014    . Essential hypertension 05/28/2010    Qualifier: Diagnosis of  By: Joyce Gross     . DEPRESSION 02/28/2007    Qualifier: Diagnosis of  By: Sarajane Jews MD, Ishmael Holter    . Asthma 02/28/2007    Followed in Pulmonary clinic/ [facility]/ [place]  - hfa 50% p coaching 11/20/2012  - PFT's 12/29/2012  1.28 (66%) ratio 69 and 13% better p B2 > normalizes and nl dlco 117    . Osteopenia 02/28/2007    Qualifier: Diagnosis of  By: Sarajane Jews MD, Ishmael Holter    . Personal hx colon polyps 02/28/2007    2003 polyps in [place] - per patient 2008 no polyps - per patient 07/20/2012 4 polyps - 1 15 mm sessile serrated adenoma and 3 diminutive (2 not recovered) hyperplastic 10/22/2015 no polyps - recall 2022      Social History   Occupational History  . Occupation: Retired Pharmacist, hospital  Tobacco Use  . Smoking status: Never Smoker  . Smokeless tobacco: Never Used  Substance and Sexual Activity  . Alcohol use: No    Alcohol/week:  0.0 standard drinks    Comment: no etoh   . Drug use: No  . Sexual activity: Not on file   Social History   Social History Narrative   Retired   Single   Regular exercise- no          OBJECTIVE:  VS:  HT:5' (152.4 cm)   WT:150 lb (68 kg)  BMI:29.3    BP:104/62  HR:80bpm  TEMP: ( )  RESP:96 %   PHYSICAL EXAM: Adult female. No acute distress.  Alert and appropriate. Right knee is well aligned without significant deformity.  She has generalized stiffness of the right knee with a knee arc 5degrees to 120 degrees.  She has a small amount of swelling in the knee but this is mild.  No significant warmth.  Ligamentously she is stable to varus and valgus  straining.  Her extensor mechanism is intact.  She has a negative Stinchfield test and no pain with logroll or FADIR testing.  Three-view x-ray of the right knee obtained today that does show well aligned hardware.  No evidence of loosening.  Right knee MRI previously reviewed was normal status post total knee arthroplasty.   ASSESSMENT:  1. Right hip pain   2. Primary osteoarthritis of right knee   3. Recurrent pain of right knee   4. S/P TKR (total knee replacement), right   5. Arthrofibrosis of knee joint, right     PROCEDURES:  None  PLAN:  Pertinent additional documentation may be included in corresponding procedure notes, imaging studies, problem based documentation and patient instructions.  No problem-specific Assessment & Plan notes found for this encounter.   Overall believes she has caused some slight increase in some synovitis.  She should respond well to the Celebrex.  Could consider intra-articular injection but given the postoperative status of this I would like to defer this to her orthopedic surgeon (Dr. Rhona Raider) if this is needed.  Frequent icing recommended.  She also reports having cut back on her therapeutic exercises I did emphasize that this is probably the most critical for keeping her mobile.  Recommend she resume her home therapeutic exercise program previously prescribed by myself and physical therapy.  Ideally nonsteroidal anti-inflammatories (NSAIDs) should not be taken on a chronic daily basis.  Appropriate use of these medications involves "bursting" full prescription strength dosing of the medication over a relatively short amount of time.  Ideally taking full doses of anti-inflammatories as discussed for 3 to 5 days at a time then stopping all NSAID use for as long as possible is the ideal way to reduce the risk of kidney/liver injury as well as GI bleeding.  Bursting a medication for up to 2 weeks to initiate then as needed for 3-5 days at a time (1-2  days after symptoms improve) can help minimize this risk.  This was discussed in detail with the patient today   Activity modifications and the importance of avoiding exacerbating activities (limiting pain to no more than a 4 / 10 during or following activity) recommended and discussed.  Discussed red flag symptoms that warrant earlier emergent evaluation and patient voices understanding.   Meds ordered this encounter  Medications  . celecoxib (CELEBREX) 100 MG capsule    Sig: Take 1 capsule (100 mg total) by mouth 2 (two) times daily as needed.    Dispense:  60 capsule    Refill:  2   Lab Orders  No laboratory test(s) ordered today    Imaging Orders  DG Knee AP/LAT W/Sunrise Right Referral Orders  No referral(s) requested today    Return if symptoms worsen or fail to improve.          [name], [place] [facility] Physician

## 2018-09-13 DIAGNOSIS — J301 Allergic rhinitis due to pollen: Secondary | ICD-10-CM | POA: Diagnosis not present

## 2018-09-13 DIAGNOSIS — J3089 Other allergic rhinitis: Secondary | ICD-10-CM | POA: Diagnosis not present

## 2018-09-13 DIAGNOSIS — J3081 Allergic rhinitis due to animal (cat) (dog) hair and dander: Secondary | ICD-10-CM | POA: Diagnosis not present

## 2018-09-21 DIAGNOSIS — J3089 Other allergic rhinitis: Secondary | ICD-10-CM | POA: Diagnosis not present

## 2018-09-21 DIAGNOSIS — J301 Allergic rhinitis due to pollen: Secondary | ICD-10-CM | POA: Diagnosis not present

## 2018-09-21 DIAGNOSIS — J3081 Allergic rhinitis due to animal (cat) (dog) hair and dander: Secondary | ICD-10-CM | POA: Diagnosis not present

## 2018-09-27 DIAGNOSIS — J3081 Allergic rhinitis due to animal (cat) (dog) hair and dander: Secondary | ICD-10-CM | POA: Diagnosis not present

## 2018-09-27 DIAGNOSIS — J301 Allergic rhinitis due to pollen: Secondary | ICD-10-CM | POA: Diagnosis not present

## 2018-09-27 DIAGNOSIS — J3089 Other allergic rhinitis: Secondary | ICD-10-CM | POA: Diagnosis not present

## 2018-09-29 DIAGNOSIS — J301 Allergic rhinitis due to pollen: Secondary | ICD-10-CM | POA: Diagnosis not present

## 2018-09-29 DIAGNOSIS — J3089 Other allergic rhinitis: Secondary | ICD-10-CM | POA: Diagnosis not present

## 2018-09-29 DIAGNOSIS — J3081 Allergic rhinitis due to animal (cat) (dog) hair and dander: Secondary | ICD-10-CM | POA: Diagnosis not present

## 2018-10-03 DIAGNOSIS — J3089 Other allergic rhinitis: Secondary | ICD-10-CM | POA: Diagnosis not present

## 2018-10-03 DIAGNOSIS — J301 Allergic rhinitis due to pollen: Secondary | ICD-10-CM | POA: Diagnosis not present

## 2018-10-03 DIAGNOSIS — J3081 Allergic rhinitis due to animal (cat) (dog) hair and dander: Secondary | ICD-10-CM | POA: Diagnosis not present

## 2018-10-09 DIAGNOSIS — J301 Allergic rhinitis due to pollen: Secondary | ICD-10-CM | POA: Diagnosis not present

## 2018-10-09 DIAGNOSIS — J3081 Allergic rhinitis due to animal (cat) (dog) hair and dander: Secondary | ICD-10-CM | POA: Diagnosis not present

## 2018-10-09 DIAGNOSIS — J3089 Other allergic rhinitis: Secondary | ICD-10-CM | POA: Diagnosis not present

## 2018-10-17 DIAGNOSIS — J301 Allergic rhinitis due to pollen: Secondary | ICD-10-CM | POA: Diagnosis not present

## 2018-10-17 DIAGNOSIS — J3081 Allergic rhinitis due to animal (cat) (dog) hair and dander: Secondary | ICD-10-CM | POA: Diagnosis not present

## 2018-10-17 DIAGNOSIS — J3089 Other allergic rhinitis: Secondary | ICD-10-CM | POA: Diagnosis not present

## 2018-10-31 DIAGNOSIS — J3089 Other allergic rhinitis: Secondary | ICD-10-CM | POA: Diagnosis not present

## 2018-10-31 DIAGNOSIS — J301 Allergic rhinitis due to pollen: Secondary | ICD-10-CM | POA: Diagnosis not present

## 2018-10-31 DIAGNOSIS — J3081 Allergic rhinitis due to animal (cat) (dog) hair and dander: Secondary | ICD-10-CM | POA: Diagnosis not present

## 2018-11-07 DIAGNOSIS — J3081 Allergic rhinitis due to animal (cat) (dog) hair and dander: Secondary | ICD-10-CM | POA: Diagnosis not present

## 2018-11-07 DIAGNOSIS — J301 Allergic rhinitis due to pollen: Secondary | ICD-10-CM | POA: Diagnosis not present

## 2018-11-07 DIAGNOSIS — J3089 Other allergic rhinitis: Secondary | ICD-10-CM | POA: Diagnosis not present

## 2018-11-20 DIAGNOSIS — J3081 Allergic rhinitis due to animal (cat) (dog) hair and dander: Secondary | ICD-10-CM | POA: Diagnosis not present

## 2018-11-20 DIAGNOSIS — J301 Allergic rhinitis due to pollen: Secondary | ICD-10-CM | POA: Diagnosis not present

## 2018-11-20 DIAGNOSIS — J3089 Other allergic rhinitis: Secondary | ICD-10-CM | POA: Diagnosis not present

## 2018-12-01 DIAGNOSIS — J3089 Other allergic rhinitis: Secondary | ICD-10-CM | POA: Diagnosis not present

## 2018-12-01 DIAGNOSIS — J301 Allergic rhinitis due to pollen: Secondary | ICD-10-CM | POA: Diagnosis not present

## 2018-12-01 DIAGNOSIS — J3081 Allergic rhinitis due to animal (cat) (dog) hair and dander: Secondary | ICD-10-CM | POA: Diagnosis not present

## 2018-12-12 ENCOUNTER — Other Ambulatory Visit: Payer: Self-pay | Admitting: Sports Medicine

## 2018-12-12 NOTE — Telephone Encounter (Signed)
Contacted Ria Comment about how to handle this refill.  Will likely need virtual visit but getting verification.

## 2018-12-14 NOTE — Telephone Encounter (Signed)
Called pt to verify whether or not she needs this refill but could not leave message due to VM being full

## 2018-12-20 NOTE — Telephone Encounter (Signed)
Called again and unable to LM due to VM being full.  Also called her friend [name] who is listed on DPR and LM for [facility] to call office regarding refill for Celebrex and whether or not she needs it.

## 2018-12-22 DIAGNOSIS — J3089 Other allergic rhinitis: Secondary | ICD-10-CM | POA: Diagnosis not present

## 2018-12-22 DIAGNOSIS — J3081 Allergic rhinitis due to animal (cat) (dog) hair and dander: Secondary | ICD-10-CM | POA: Diagnosis not present

## 2018-12-22 DIAGNOSIS — J301 Allergic rhinitis due to pollen: Secondary | ICD-10-CM | POA: Diagnosis not present

## 2018-12-26 DIAGNOSIS — J301 Allergic rhinitis due to pollen: Secondary | ICD-10-CM | POA: Diagnosis not present

## 2018-12-26 DIAGNOSIS — J3089 Other allergic rhinitis: Secondary | ICD-10-CM | POA: Diagnosis not present

## 2018-12-26 DIAGNOSIS — J3081 Allergic rhinitis due to animal (cat) (dog) hair and dander: Secondary | ICD-10-CM | POA: Diagnosis not present

## 2018-12-28 DIAGNOSIS — J3081 Allergic rhinitis due to animal (cat) (dog) hair and dander: Secondary | ICD-10-CM | POA: Diagnosis not present

## 2018-12-28 DIAGNOSIS — J301 Allergic rhinitis due to pollen: Secondary | ICD-10-CM | POA: Diagnosis not present

## 2018-12-28 DIAGNOSIS — J3089 Other allergic rhinitis: Secondary | ICD-10-CM | POA: Diagnosis not present

## 2019-01-03 DIAGNOSIS — J3089 Other allergic rhinitis: Secondary | ICD-10-CM | POA: Diagnosis not present

## 2019-01-03 DIAGNOSIS — J3081 Allergic rhinitis due to animal (cat) (dog) hair and dander: Secondary | ICD-10-CM | POA: Diagnosis not present

## 2019-01-03 DIAGNOSIS — J301 Allergic rhinitis due to pollen: Secondary | ICD-10-CM | POA: Diagnosis not present

## 2019-01-07 ENCOUNTER — Other Ambulatory Visit: Payer: Self-pay | Admitting: Family Medicine

## 2019-01-09 NOTE — Telephone Encounter (Signed)
Dr. Fry please advise on refill. Thanks  

## 2019-01-09 NOTE — Telephone Encounter (Signed)
Call in #[phone] with 5 rf

## 2019-01-16 DIAGNOSIS — J301 Allergic rhinitis due to pollen: Secondary | ICD-10-CM | POA: Diagnosis not present

## 2019-01-16 DIAGNOSIS — J3081 Allergic rhinitis due to animal (cat) (dog) hair and dander: Secondary | ICD-10-CM | POA: Diagnosis not present

## 2019-01-16 DIAGNOSIS — J3089 Other allergic rhinitis: Secondary | ICD-10-CM | POA: Diagnosis not present

## 2019-01-19 ENCOUNTER — Other Ambulatory Visit: Payer: Self-pay | Admitting: Sports Medicine

## 2019-01-23 ENCOUNTER — Telehealth: Payer: Self-pay | Admitting: Family Medicine

## 2019-01-23 DIAGNOSIS — M1711 Unilateral primary osteoarthritis, right knee: Secondary | ICD-10-CM

## 2019-01-23 NOTE — Telephone Encounter (Signed)
Medication Refill - Medication: celecoxib (CELEBREX) 100 MG capsule [[id number]]    Has the patient contacted their pharmacy? No. (Agent: If no, request that the patient contact the pharmacy for the refill.)   Preferred Pharmacy (with phone number or street name):  [facility] [address] [place] - [address] [phone] (Phone) [phone] (Fax)     Agent: Please be advised that RX refills may take up to 3 business days. We ask that you follow-up with your pharmacy.

## 2019-01-24 MED ORDER — CELECOXIB 100 MG PO CAPS: 100.0000 mg | ORAL_CAPSULE | Freq: Two times a day (BID) | ORAL | 11 refills | Status: DC | PRN

## 2019-01-24 NOTE — Telephone Encounter (Signed)
Yes call in #[phone] with 11 rf

## 2019-01-24 NOTE — Telephone Encounter (Signed)
Is it okay to refill. I see [name] prescribed medication

## 2019-01-31 DIAGNOSIS — J3089 Other allergic rhinitis: Secondary | ICD-10-CM | POA: Diagnosis not present

## 2019-01-31 DIAGNOSIS — J3081 Allergic rhinitis due to animal (cat) (dog) hair and dander: Secondary | ICD-10-CM | POA: Diagnosis not present

## 2019-01-31 DIAGNOSIS — J301 Allergic rhinitis due to pollen: Secondary | ICD-10-CM | POA: Diagnosis not present

## 2019-02-07 DIAGNOSIS — J3089 Other allergic rhinitis: Secondary | ICD-10-CM | POA: Diagnosis not present

## 2019-02-07 DIAGNOSIS — J301 Allergic rhinitis due to pollen: Secondary | ICD-10-CM | POA: Diagnosis not present

## 2019-02-07 DIAGNOSIS — J3081 Allergic rhinitis due to animal (cat) (dog) hair and dander: Secondary | ICD-10-CM | POA: Diagnosis not present

## 2019-02-21 DIAGNOSIS — J3081 Allergic rhinitis due to animal (cat) (dog) hair and dander: Secondary | ICD-10-CM | POA: Diagnosis not present

## 2019-02-21 DIAGNOSIS — J3089 Other allergic rhinitis: Secondary | ICD-10-CM | POA: Diagnosis not present

## 2019-02-21 DIAGNOSIS — J301 Allergic rhinitis due to pollen: Secondary | ICD-10-CM | POA: Diagnosis not present

## 2019-03-01 DIAGNOSIS — J3081 Allergic rhinitis due to animal (cat) (dog) hair and dander: Secondary | ICD-10-CM | POA: Diagnosis not present

## 2019-03-01 DIAGNOSIS — J3089 Other allergic rhinitis: Secondary | ICD-10-CM | POA: Diagnosis not present

## 2019-03-01 DIAGNOSIS — J301 Allergic rhinitis due to pollen: Secondary | ICD-10-CM | POA: Diagnosis not present

## 2019-03-14 DIAGNOSIS — J3081 Allergic rhinitis due to animal (cat) (dog) hair and dander: Secondary | ICD-10-CM | POA: Diagnosis not present

## 2019-03-14 DIAGNOSIS — J3089 Other allergic rhinitis: Secondary | ICD-10-CM | POA: Diagnosis not present

## 2019-03-14 DIAGNOSIS — J301 Allergic rhinitis due to pollen: Secondary | ICD-10-CM | POA: Diagnosis not present

## 2019-03-21 DIAGNOSIS — J301 Allergic rhinitis due to pollen: Secondary | ICD-10-CM | POA: Diagnosis not present

## 2019-03-21 DIAGNOSIS — J3089 Other allergic rhinitis: Secondary | ICD-10-CM | POA: Diagnosis not present

## 2019-03-21 DIAGNOSIS — J3081 Allergic rhinitis due to animal (cat) (dog) hair and dander: Secondary | ICD-10-CM | POA: Diagnosis not present

## 2019-04-02 DIAGNOSIS — J3089 Other allergic rhinitis: Secondary | ICD-10-CM | POA: Diagnosis not present

## 2019-04-02 DIAGNOSIS — J3081 Allergic rhinitis due to animal (cat) (dog) hair and dander: Secondary | ICD-10-CM | POA: Diagnosis not present

## 2019-04-02 DIAGNOSIS — J301 Allergic rhinitis due to pollen: Secondary | ICD-10-CM | POA: Diagnosis not present

## 2019-04-12 DIAGNOSIS — J3081 Allergic rhinitis due to animal (cat) (dog) hair and dander: Secondary | ICD-10-CM | POA: Diagnosis not present

## 2019-04-12 DIAGNOSIS — J301 Allergic rhinitis due to pollen: Secondary | ICD-10-CM | POA: Diagnosis not present

## 2019-04-12 DIAGNOSIS — J3089 Other allergic rhinitis: Secondary | ICD-10-CM | POA: Diagnosis not present

## 2019-04-19 DIAGNOSIS — J3081 Allergic rhinitis due to animal (cat) (dog) hair and dander: Secondary | ICD-10-CM | POA: Diagnosis not present

## 2019-04-19 DIAGNOSIS — J3089 Other allergic rhinitis: Secondary | ICD-10-CM | POA: Diagnosis not present

## 2019-04-19 DIAGNOSIS — J301 Allergic rhinitis due to pollen: Secondary | ICD-10-CM | POA: Diagnosis not present

## 2019-05-03 DIAGNOSIS — J3081 Allergic rhinitis due to animal (cat) (dog) hair and dander: Secondary | ICD-10-CM | POA: Diagnosis not present

## 2019-05-03 DIAGNOSIS — J301 Allergic rhinitis due to pollen: Secondary | ICD-10-CM | POA: Diagnosis not present

## 2019-05-03 DIAGNOSIS — J3089 Other allergic rhinitis: Secondary | ICD-10-CM | POA: Diagnosis not present

## 2019-05-11 DIAGNOSIS — J301 Allergic rhinitis due to pollen: Secondary | ICD-10-CM | POA: Diagnosis not present

## 2019-05-11 DIAGNOSIS — J3089 Other allergic rhinitis: Secondary | ICD-10-CM | POA: Diagnosis not present

## 2019-05-11 DIAGNOSIS — J3081 Allergic rhinitis due to animal (cat) (dog) hair and dander: Secondary | ICD-10-CM | POA: Diagnosis not present

## 2019-05-17 DIAGNOSIS — J3081 Allergic rhinitis due to animal (cat) (dog) hair and dander: Secondary | ICD-10-CM | POA: Diagnosis not present

## 2019-05-17 DIAGNOSIS — J301 Allergic rhinitis due to pollen: Secondary | ICD-10-CM | POA: Diagnosis not present

## 2019-05-17 DIAGNOSIS — J3089 Other allergic rhinitis: Secondary | ICD-10-CM | POA: Diagnosis not present

## 2019-05-21 DIAGNOSIS — J3081 Allergic rhinitis due to animal (cat) (dog) hair and dander: Secondary | ICD-10-CM | POA: Diagnosis not present

## 2019-05-21 DIAGNOSIS — J3089 Other allergic rhinitis: Secondary | ICD-10-CM | POA: Diagnosis not present

## 2019-05-21 DIAGNOSIS — J301 Allergic rhinitis due to pollen: Secondary | ICD-10-CM | POA: Diagnosis not present

## 2019-05-31 DIAGNOSIS — J3089 Other allergic rhinitis: Secondary | ICD-10-CM | POA: Diagnosis not present

## 2019-05-31 DIAGNOSIS — J4520 Mild intermittent asthma, uncomplicated: Secondary | ICD-10-CM | POA: Diagnosis not present

## 2019-05-31 DIAGNOSIS — J301 Allergic rhinitis due to pollen: Secondary | ICD-10-CM | POA: Diagnosis not present

## 2019-05-31 DIAGNOSIS — J3081 Allergic rhinitis due to animal (cat) (dog) hair and dander: Secondary | ICD-10-CM | POA: Diagnosis not present

## 2019-06-05 DIAGNOSIS — J3081 Allergic rhinitis due to animal (cat) (dog) hair and dander: Secondary | ICD-10-CM | POA: Diagnosis not present

## 2019-06-05 DIAGNOSIS — J3089 Other allergic rhinitis: Secondary | ICD-10-CM | POA: Diagnosis not present

## 2019-06-05 DIAGNOSIS — J301 Allergic rhinitis due to pollen: Secondary | ICD-10-CM | POA: Diagnosis not present

## 2019-06-13 DIAGNOSIS — J3089 Other allergic rhinitis: Secondary | ICD-10-CM | POA: Diagnosis not present

## 2019-06-13 DIAGNOSIS — J301 Allergic rhinitis due to pollen: Secondary | ICD-10-CM | POA: Diagnosis not present

## 2019-06-13 DIAGNOSIS — J3081 Allergic rhinitis due to animal (cat) (dog) hair and dander: Secondary | ICD-10-CM | POA: Diagnosis not present

## 2019-06-15 DIAGNOSIS — J3089 Other allergic rhinitis: Secondary | ICD-10-CM | POA: Diagnosis not present

## 2019-06-15 DIAGNOSIS — J3081 Allergic rhinitis due to animal (cat) (dog) hair and dander: Secondary | ICD-10-CM | POA: Diagnosis not present

## 2019-06-15 DIAGNOSIS — J301 Allergic rhinitis due to pollen: Secondary | ICD-10-CM | POA: Diagnosis not present

## 2019-06-19 DIAGNOSIS — J3089 Other allergic rhinitis: Secondary | ICD-10-CM | POA: Diagnosis not present

## 2019-06-19 DIAGNOSIS — J3081 Allergic rhinitis due to animal (cat) (dog) hair and dander: Secondary | ICD-10-CM | POA: Diagnosis not present

## 2019-06-19 DIAGNOSIS — J301 Allergic rhinitis due to pollen: Secondary | ICD-10-CM | POA: Diagnosis not present

## 2019-06-21 DIAGNOSIS — J3081 Allergic rhinitis due to animal (cat) (dog) hair and dander: Secondary | ICD-10-CM | POA: Diagnosis not present

## 2019-06-21 DIAGNOSIS — J301 Allergic rhinitis due to pollen: Secondary | ICD-10-CM | POA: Diagnosis not present

## 2019-06-21 DIAGNOSIS — J3089 Other allergic rhinitis: Secondary | ICD-10-CM | POA: Diagnosis not present

## 2019-06-29 DIAGNOSIS — J3081 Allergic rhinitis due to animal (cat) (dog) hair and dander: Secondary | ICD-10-CM | POA: Diagnosis not present

## 2019-06-29 DIAGNOSIS — J3089 Other allergic rhinitis: Secondary | ICD-10-CM | POA: Diagnosis not present

## 2019-06-29 DIAGNOSIS — J301 Allergic rhinitis due to pollen: Secondary | ICD-10-CM | POA: Diagnosis not present

## 2019-07-02 DIAGNOSIS — J3089 Other allergic rhinitis: Secondary | ICD-10-CM | POA: Diagnosis not present

## 2019-07-02 DIAGNOSIS — J301 Allergic rhinitis due to pollen: Secondary | ICD-10-CM | POA: Diagnosis not present

## 2019-07-02 DIAGNOSIS — J3081 Allergic rhinitis due to animal (cat) (dog) hair and dander: Secondary | ICD-10-CM | POA: Diagnosis not present

## 2019-07-09 DIAGNOSIS — J3081 Allergic rhinitis due to animal (cat) (dog) hair and dander: Secondary | ICD-10-CM | POA: Diagnosis not present

## 2019-07-09 DIAGNOSIS — J3089 Other allergic rhinitis: Secondary | ICD-10-CM | POA: Diagnosis not present

## 2019-07-09 DIAGNOSIS — J301 Allergic rhinitis due to pollen: Secondary | ICD-10-CM | POA: Diagnosis not present

## 2019-07-18 DIAGNOSIS — J3089 Other allergic rhinitis: Secondary | ICD-10-CM | POA: Diagnosis not present

## 2019-07-18 DIAGNOSIS — J3081 Allergic rhinitis due to animal (cat) (dog) hair and dander: Secondary | ICD-10-CM | POA: Diagnosis not present

## 2019-07-18 DIAGNOSIS — J301 Allergic rhinitis due to pollen: Secondary | ICD-10-CM | POA: Diagnosis not present

## 2019-07-27 DIAGNOSIS — J3081 Allergic rhinitis due to animal (cat) (dog) hair and dander: Secondary | ICD-10-CM | POA: Diagnosis not present

## 2019-07-27 DIAGNOSIS — J301 Allergic rhinitis due to pollen: Secondary | ICD-10-CM | POA: Diagnosis not present

## 2019-07-27 DIAGNOSIS — J3089 Other allergic rhinitis: Secondary | ICD-10-CM | POA: Diagnosis not present

## 2019-08-01 DIAGNOSIS — J3081 Allergic rhinitis due to animal (cat) (dog) hair and dander: Secondary | ICD-10-CM | POA: Diagnosis not present

## 2019-08-01 DIAGNOSIS — J301 Allergic rhinitis due to pollen: Secondary | ICD-10-CM | POA: Diagnosis not present

## 2019-08-01 DIAGNOSIS — J3089 Other allergic rhinitis: Secondary | ICD-10-CM | POA: Diagnosis not present

## 2019-08-03 ENCOUNTER — Other Ambulatory Visit: Payer: Self-pay | Admitting: Family Medicine

## 2019-08-08 ENCOUNTER — Other Ambulatory Visit: Payer: Self-pay | Admitting: Family Medicine

## 2019-08-08 NOTE — Telephone Encounter (Signed)
Last filled 01/10/2019 Last OV 07/10/2018  Ok to fill?

## 2019-08-09 ENCOUNTER — Telehealth: Payer: Self-pay | Admitting: Family Medicine

## 2019-08-09 NOTE — Telephone Encounter (Signed)
Pt is requesting a refill on traMADol (ULTRAM) 50 MG tablet. Pt uses [facility] on [facility]([place]). Thanks

## 2019-08-10 NOTE — Telephone Encounter (Signed)
Rx was refilled yesterday. Patient is aware.

## 2019-08-13 DIAGNOSIS — J301 Allergic rhinitis due to pollen: Secondary | ICD-10-CM | POA: Diagnosis not present

## 2019-08-13 DIAGNOSIS — J3089 Other allergic rhinitis: Secondary | ICD-10-CM | POA: Diagnosis not present

## 2019-08-13 DIAGNOSIS — J3081 Allergic rhinitis due to animal (cat) (dog) hair and dander: Secondary | ICD-10-CM | POA: Diagnosis not present

## 2019-09-03 DIAGNOSIS — J3081 Allergic rhinitis due to animal (cat) (dog) hair and dander: Secondary | ICD-10-CM | POA: Diagnosis not present

## 2019-09-03 DIAGNOSIS — J3089 Other allergic rhinitis: Secondary | ICD-10-CM | POA: Diagnosis not present

## 2019-09-03 DIAGNOSIS — J301 Allergic rhinitis due to pollen: Secondary | ICD-10-CM | POA: Diagnosis not present

## 2019-09-14 DIAGNOSIS — J3089 Other allergic rhinitis: Secondary | ICD-10-CM | POA: Diagnosis not present

## 2019-09-14 DIAGNOSIS — J301 Allergic rhinitis due to pollen: Secondary | ICD-10-CM | POA: Diagnosis not present

## 2019-09-14 DIAGNOSIS — J3081 Allergic rhinitis due to animal (cat) (dog) hair and dander: Secondary | ICD-10-CM | POA: Diagnosis not present

## 2019-09-19 DIAGNOSIS — J301 Allergic rhinitis due to pollen: Secondary | ICD-10-CM | POA: Diagnosis not present

## 2019-09-19 DIAGNOSIS — J3081 Allergic rhinitis due to animal (cat) (dog) hair and dander: Secondary | ICD-10-CM | POA: Diagnosis not present

## 2019-09-19 DIAGNOSIS — J3089 Other allergic rhinitis: Secondary | ICD-10-CM | POA: Diagnosis not present

## 2019-10-03 DIAGNOSIS — J3081 Allergic rhinitis due to animal (cat) (dog) hair and dander: Secondary | ICD-10-CM | POA: Diagnosis not present

## 2019-10-03 DIAGNOSIS — J3089 Other allergic rhinitis: Secondary | ICD-10-CM | POA: Diagnosis not present

## 2019-10-03 DIAGNOSIS — J301 Allergic rhinitis due to pollen: Secondary | ICD-10-CM | POA: Diagnosis not present

## 2019-10-06 IMAGING — MR MR KNEE*R* WO/W CM
5 of 12 series · 17 of 40 positions shown · IV contrast (multihance)
Comparison: None.

CLINICAL DATA: Pain after knee replacement June 2016.

EXAM:
MRI OF THE RIGHT KNEE WITHOUT AND WITH CONTRAST
TECHNIQUE: Multiplanar, multisequence MR imaging of the right knee was
performed both before and after administration of intravenous
contrast.
CONTRAST:  15mL MULTIHANCE GADOBENATE DIMEGLUMINE 529 MG/ML IV SOLN

[Series 4: T1 · axial · 3.5mm · 0.33mm/px · z∈[-136,+57]mm · 5 of 47 slices shown (1 of 2)]
[im 1/47]
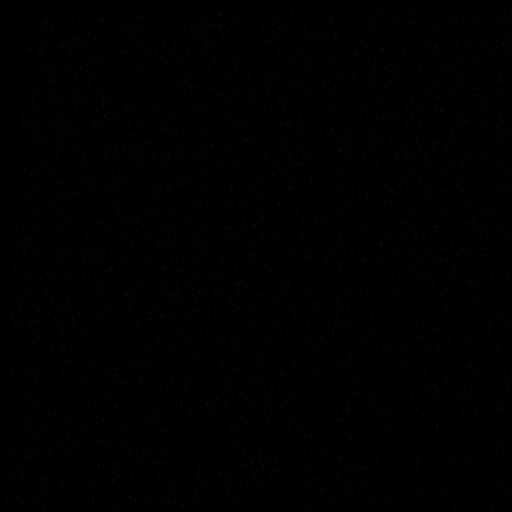
[im 12/47]
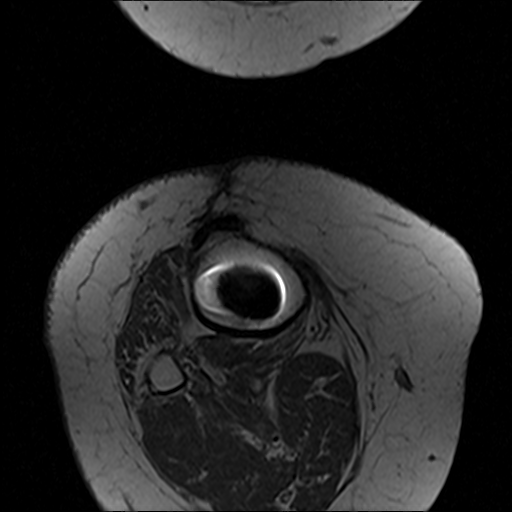
[im 24/47]
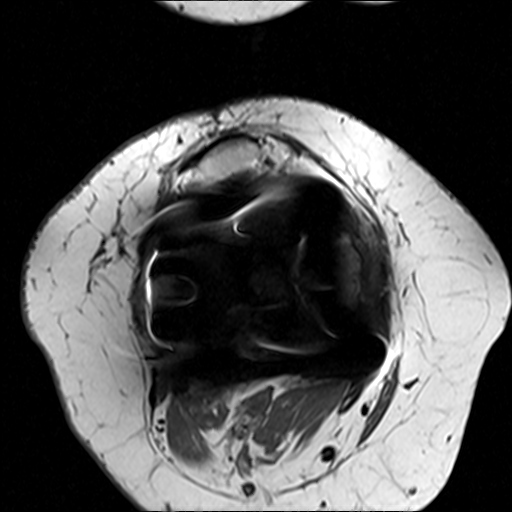
[im 35/47]
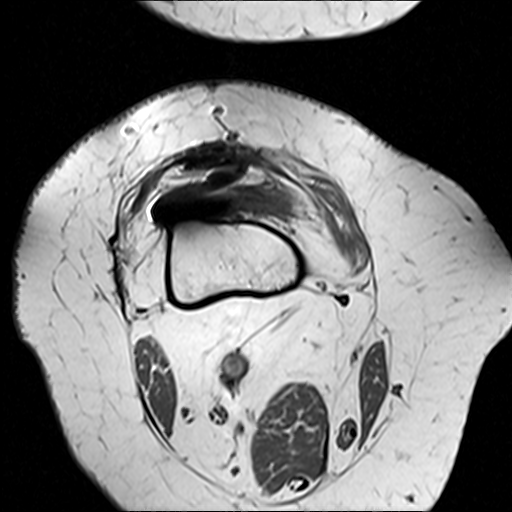
[im 47/47]
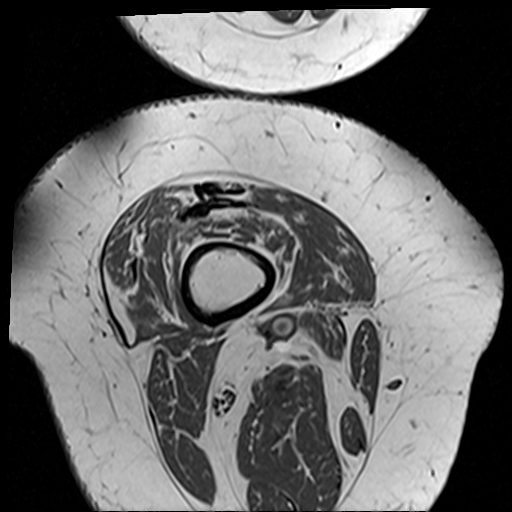

[Series 6: T1 · coronal · 3.5mm · 0.39mm/px · 3 of 33 slices shown (2 of 2)]
[im 1/33]
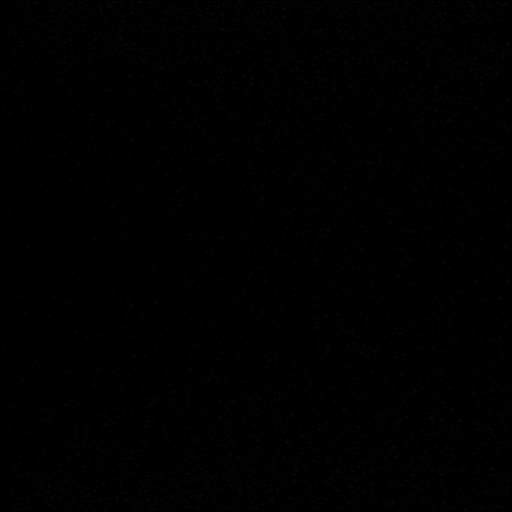
[im 17/33]
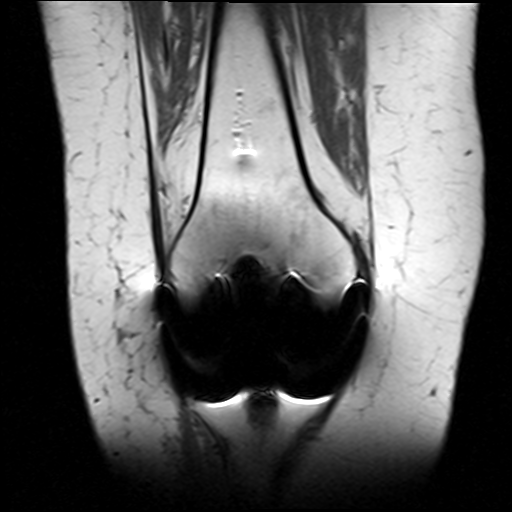
[im 33/33]
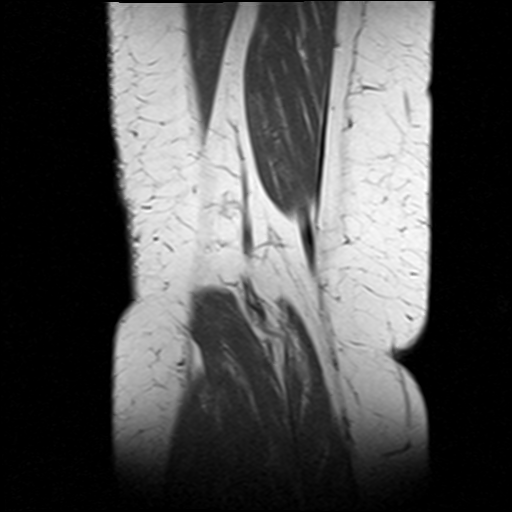

[Series 9: PD · sagittal · 4.0mm · 0.39mm/px · 3 of 32 slices shown]
[im 1/32]
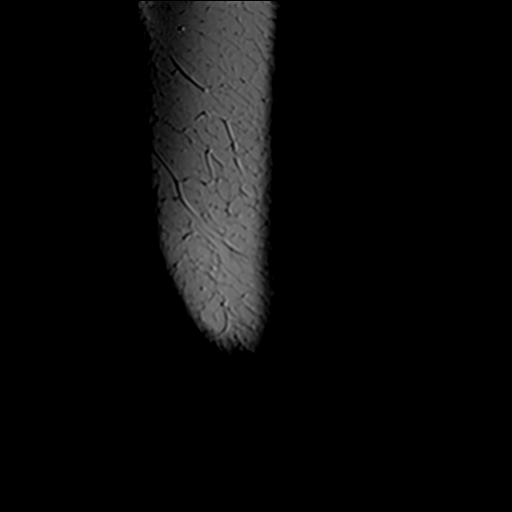
[im 16/32]
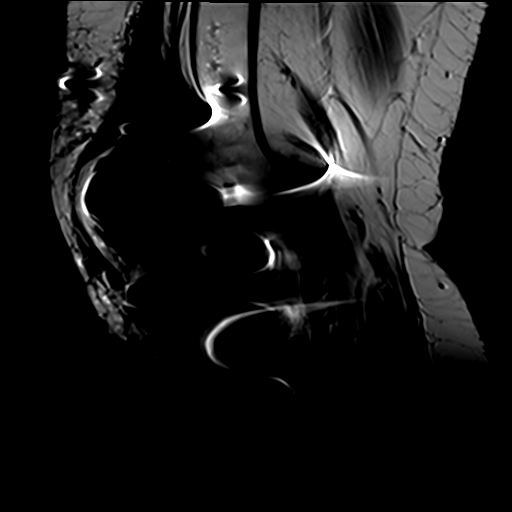
[im 32/32]
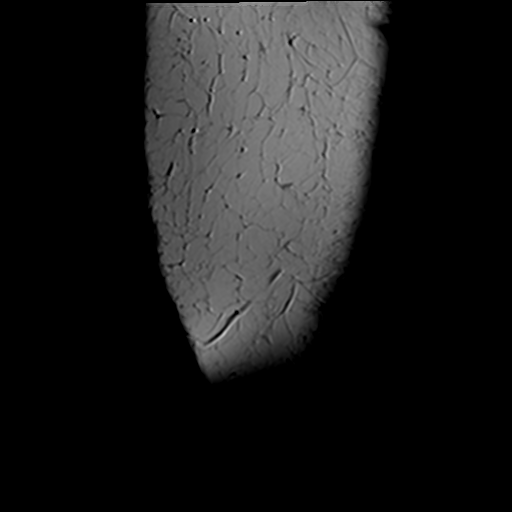

[Series 10: T1 fat-sat · axial · non-contrast · 3.5mm · 0.27mm/px · z∈[-152,+41]mm · 5 of 47 slices shown]
[im 1/47]
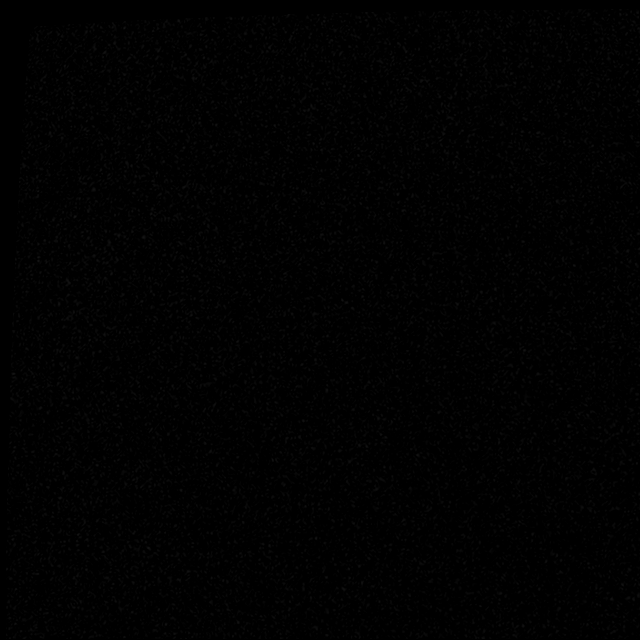
[im 12/47]
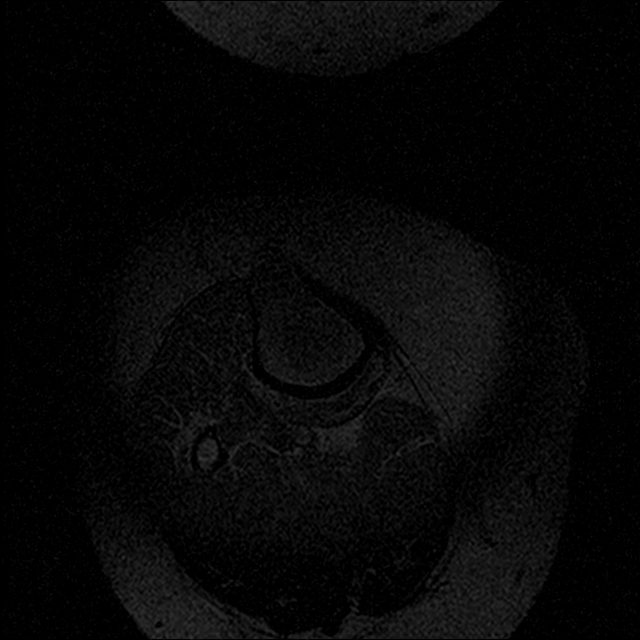
[im 24/47]
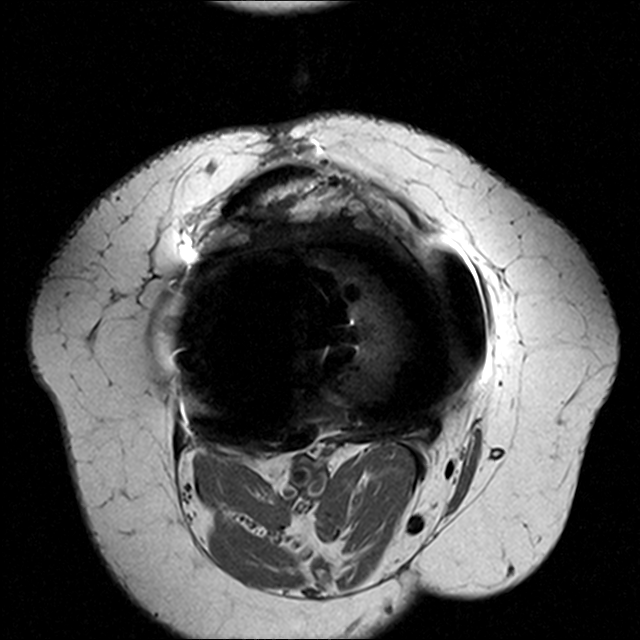
[im 35/47]
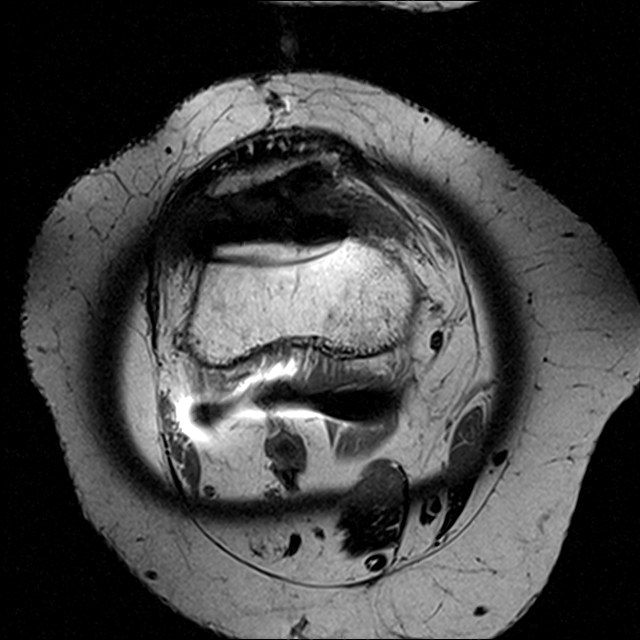
[im 47/47]
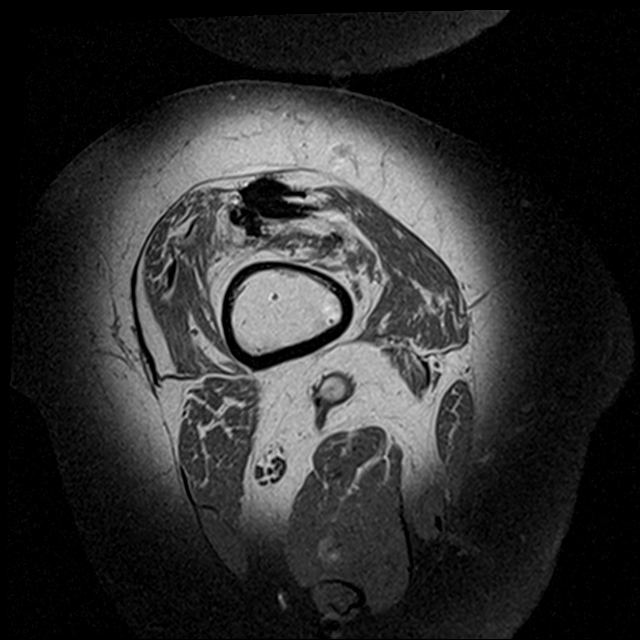

[Series 11: T1 fat-sat post-contrast · axial · 3.5mm · 0.27mm/px · 1 of 47 slices shown]
[im 1/47]
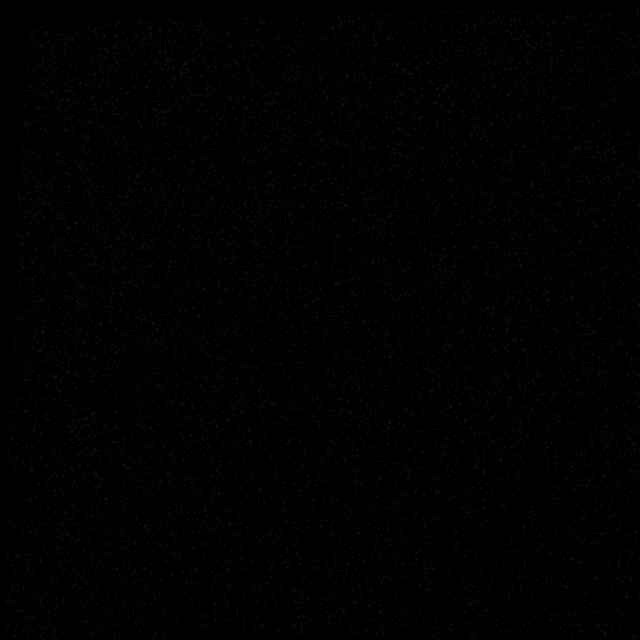

[17 of 40 positions shown; findings below may reference images not displayed]

FINDINGS: Bones/Joint/Cartilage

Right total knee arthroplasty. No hardware failure or complication.
No periarticular fluid collection. No osteolysis.

No focal marrow signal abnormality.

No fracture or dislocation. Normal alignment. No joint effusion.

Ligaments

Collateral ligaments are intact.

Muscles and Tendons
Muscles are normal. No muscle atrophy. Quadriceps tendon and
patellar tendon are intact.

Soft tissue
No fluid collection or hematoma.  No soft tissue mass.
IMPRESSION: 1. Right total knee arthroplasty without hardware failure or
complication.

## 2019-10-19 DIAGNOSIS — J301 Allergic rhinitis due to pollen: Secondary | ICD-10-CM | POA: Diagnosis not present

## 2019-10-19 DIAGNOSIS — J3081 Allergic rhinitis due to animal (cat) (dog) hair and dander: Secondary | ICD-10-CM | POA: Diagnosis not present

## 2019-10-19 DIAGNOSIS — J3089 Other allergic rhinitis: Secondary | ICD-10-CM | POA: Diagnosis not present

## 2019-10-25 DIAGNOSIS — J3089 Other allergic rhinitis: Secondary | ICD-10-CM | POA: Diagnosis not present

## 2019-10-25 DIAGNOSIS — J3081 Allergic rhinitis due to animal (cat) (dog) hair and dander: Secondary | ICD-10-CM | POA: Diagnosis not present

## 2019-10-25 DIAGNOSIS — J301 Allergic rhinitis due to pollen: Secondary | ICD-10-CM | POA: Diagnosis not present

## 2019-11-05 DIAGNOSIS — J3081 Allergic rhinitis due to animal (cat) (dog) hair and dander: Secondary | ICD-10-CM | POA: Diagnosis not present

## 2019-11-05 DIAGNOSIS — J3089 Other allergic rhinitis: Secondary | ICD-10-CM | POA: Diagnosis not present

## 2019-11-05 DIAGNOSIS — J301 Allergic rhinitis due to pollen: Secondary | ICD-10-CM | POA: Diagnosis not present

## 2019-11-15 DIAGNOSIS — L814 Other melanin hyperpigmentation: Secondary | ICD-10-CM | POA: Diagnosis not present

## 2019-11-15 DIAGNOSIS — D225 Melanocytic nevi of trunk: Secondary | ICD-10-CM | POA: Diagnosis not present

## 2019-11-16 DIAGNOSIS — J3081 Allergic rhinitis due to animal (cat) (dog) hair and dander: Secondary | ICD-10-CM | POA: Diagnosis not present

## 2019-11-16 DIAGNOSIS — J3089 Other allergic rhinitis: Secondary | ICD-10-CM | POA: Diagnosis not present

## 2019-11-16 DIAGNOSIS — J301 Allergic rhinitis due to pollen: Secondary | ICD-10-CM | POA: Diagnosis not present

## 2019-11-21 DIAGNOSIS — J3081 Allergic rhinitis due to animal (cat) (dog) hair and dander: Secondary | ICD-10-CM | POA: Diagnosis not present

## 2019-11-21 DIAGNOSIS — J3089 Other allergic rhinitis: Secondary | ICD-10-CM | POA: Diagnosis not present

## 2019-11-21 DIAGNOSIS — J301 Allergic rhinitis due to pollen: Secondary | ICD-10-CM | POA: Diagnosis not present

## 2019-11-23 DIAGNOSIS — J3089 Other allergic rhinitis: Secondary | ICD-10-CM | POA: Diagnosis not present

## 2019-11-23 DIAGNOSIS — J301 Allergic rhinitis due to pollen: Secondary | ICD-10-CM | POA: Diagnosis not present

## 2019-11-23 DIAGNOSIS — J3081 Allergic rhinitis due to animal (cat) (dog) hair and dander: Secondary | ICD-10-CM | POA: Diagnosis not present

## 2019-11-28 DIAGNOSIS — J3089 Other allergic rhinitis: Secondary | ICD-10-CM | POA: Diagnosis not present

## 2019-11-28 DIAGNOSIS — J3081 Allergic rhinitis due to animal (cat) (dog) hair and dander: Secondary | ICD-10-CM | POA: Diagnosis not present

## 2019-11-28 DIAGNOSIS — J301 Allergic rhinitis due to pollen: Secondary | ICD-10-CM | POA: Diagnosis not present

## 2019-12-06 DIAGNOSIS — J3081 Allergic rhinitis due to animal (cat) (dog) hair and dander: Secondary | ICD-10-CM | POA: Diagnosis not present

## 2019-12-06 DIAGNOSIS — J3089 Other allergic rhinitis: Secondary | ICD-10-CM | POA: Diagnosis not present

## 2019-12-06 DIAGNOSIS — J301 Allergic rhinitis due to pollen: Secondary | ICD-10-CM | POA: Diagnosis not present

## 2019-12-13 DIAGNOSIS — J301 Allergic rhinitis due to pollen: Secondary | ICD-10-CM | POA: Diagnosis not present

## 2019-12-13 DIAGNOSIS — J3081 Allergic rhinitis due to animal (cat) (dog) hair and dander: Secondary | ICD-10-CM | POA: Diagnosis not present

## 2019-12-13 DIAGNOSIS — J3089 Other allergic rhinitis: Secondary | ICD-10-CM | POA: Diagnosis not present

## 2019-12-21 ENCOUNTER — Telehealth: Payer: Self-pay | Admitting: Family Medicine

## 2019-12-21 NOTE — Telephone Encounter (Signed)
I checked in Epic, and there is no Public house manager in [place]

## 2019-12-21 NOTE — Telephone Encounter (Signed)
Pt call and said she need 12 pill of traMADol (ULTRAM) 50 MG tablet sent to [facility] at [address] in [place].

## 2019-12-21 NOTE — Telephone Encounter (Signed)
Last filled 08/09/2019 Last OV 06/13/18  Patient needs an appointment. Please deny this medication if appropriate

## 2019-12-21 NOTE — Telephone Encounter (Signed)
Left a detailed message on verified voice mail asking the patient to call back and verify the pharmacy.

## 2019-12-25 NOTE — Telephone Encounter (Signed)
Left message for patient to call back  

## 2019-12-26 NOTE — Telephone Encounter (Signed)
Left a detailed message on verified voice mail asking the patient to call back and verify the pharmacy so that we can get this sent in.   Unable to reach the patient. Message will be closed.

## 2020-01-09 DIAGNOSIS — J3081 Allergic rhinitis due to animal (cat) (dog) hair and dander: Secondary | ICD-10-CM | POA: Diagnosis not present

## 2020-01-09 DIAGNOSIS — J3089 Other allergic rhinitis: Secondary | ICD-10-CM | POA: Diagnosis not present

## 2020-01-09 DIAGNOSIS — J301 Allergic rhinitis due to pollen: Secondary | ICD-10-CM | POA: Diagnosis not present

## 2020-01-10 DIAGNOSIS — H5213 Myopia, bilateral: Secondary | ICD-10-CM | POA: Diagnosis not present

## 2020-01-10 DIAGNOSIS — H52203 Unspecified astigmatism, bilateral: Secondary | ICD-10-CM | POA: Diagnosis not present

## 2020-01-10 DIAGNOSIS — H2513 Age-related nuclear cataract, bilateral: Secondary | ICD-10-CM | POA: Diagnosis not present

## 2020-01-16 DIAGNOSIS — J301 Allergic rhinitis due to pollen: Secondary | ICD-10-CM | POA: Diagnosis not present

## 2020-01-16 DIAGNOSIS — J3089 Other allergic rhinitis: Secondary | ICD-10-CM | POA: Diagnosis not present

## 2020-01-16 DIAGNOSIS — J3081 Allergic rhinitis due to animal (cat) (dog) hair and dander: Secondary | ICD-10-CM | POA: Diagnosis not present

## 2020-01-23 DIAGNOSIS — J3081 Allergic rhinitis due to animal (cat) (dog) hair and dander: Secondary | ICD-10-CM | POA: Diagnosis not present

## 2020-01-23 DIAGNOSIS — J301 Allergic rhinitis due to pollen: Secondary | ICD-10-CM | POA: Diagnosis not present

## 2020-01-23 DIAGNOSIS — J3089 Other allergic rhinitis: Secondary | ICD-10-CM | POA: Diagnosis not present

## 2020-01-26 ENCOUNTER — Other Ambulatory Visit: Payer: Self-pay | Admitting: Family Medicine

## 2020-01-26 DIAGNOSIS — M1711 Unilateral primary osteoarthritis, right knee: Secondary | ICD-10-CM

## 2020-01-30 DIAGNOSIS — J3089 Other allergic rhinitis: Secondary | ICD-10-CM | POA: Diagnosis not present

## 2020-01-30 DIAGNOSIS — J3081 Allergic rhinitis due to animal (cat) (dog) hair and dander: Secondary | ICD-10-CM | POA: Diagnosis not present

## 2020-01-30 DIAGNOSIS — J301 Allergic rhinitis due to pollen: Secondary | ICD-10-CM | POA: Diagnosis not present

## 2020-02-07 DIAGNOSIS — J3089 Other allergic rhinitis: Secondary | ICD-10-CM | POA: Diagnosis not present

## 2020-02-07 DIAGNOSIS — J301 Allergic rhinitis due to pollen: Secondary | ICD-10-CM | POA: Diagnosis not present

## 2020-02-07 DIAGNOSIS — J3081 Allergic rhinitis due to animal (cat) (dog) hair and dander: Secondary | ICD-10-CM | POA: Diagnosis not present

## 2020-02-13 DIAGNOSIS — J301 Allergic rhinitis due to pollen: Secondary | ICD-10-CM | POA: Diagnosis not present

## 2020-02-13 DIAGNOSIS — J3081 Allergic rhinitis due to animal (cat) (dog) hair and dander: Secondary | ICD-10-CM | POA: Diagnosis not present

## 2020-02-13 DIAGNOSIS — J3089 Other allergic rhinitis: Secondary | ICD-10-CM | POA: Diagnosis not present

## 2020-02-20 ENCOUNTER — Telehealth: Payer: Self-pay | Admitting: Family Medicine

## 2020-02-20 DIAGNOSIS — M1711 Unilateral primary osteoarthritis, right knee: Secondary | ICD-10-CM

## 2020-02-22 DIAGNOSIS — J3081 Allergic rhinitis due to animal (cat) (dog) hair and dander: Secondary | ICD-10-CM | POA: Diagnosis not present

## 2020-02-22 DIAGNOSIS — J3089 Other allergic rhinitis: Secondary | ICD-10-CM | POA: Diagnosis not present

## 2020-02-22 DIAGNOSIS — J301 Allergic rhinitis due to pollen: Secondary | ICD-10-CM | POA: Diagnosis not present

## 2020-02-25 NOTE — Telephone Encounter (Signed)
Pt is scheduled for a cpe on September 15th and she is wondering if these meds can be refilled until she is able to be seen?   Medication refill:  Tramadol  Celecoxib  Pharmacy:  [facility] [address] [place]

## 2020-02-25 NOTE — Telephone Encounter (Signed)
Tramadol last filled 08/09/2019  Ok to fill?

## 2020-02-26 MED ORDER — TRAMADOL HCL 50 MG PO TABS: ORAL_TABLET | ORAL | 2 refills | Status: DC

## 2020-02-26 NOTE — Telephone Encounter (Signed)
ATC to let the patient know her tramadol has been sent in. unable to leave a message. Voicemail is full.

## 2020-02-26 NOTE — Addendum Note (Signed)
Addended by: Alysia Penna A on: 02/26/2020 12:19 PM   Modules accepted: Orders

## 2020-02-26 NOTE — Telephone Encounter (Signed)
Done

## 2020-02-28 DIAGNOSIS — M170 Bilateral primary osteoarthritis of knee: Secondary | ICD-10-CM | POA: Diagnosis not present

## 2020-02-28 DIAGNOSIS — M25562 Pain in left knee: Secondary | ICD-10-CM | POA: Diagnosis not present

## 2020-02-28 DIAGNOSIS — T8484XD Pain due to internal orthopedic prosthetic devices, implants and grafts, subsequent encounter: Secondary | ICD-10-CM | POA: Diagnosis not present

## 2020-02-28 DIAGNOSIS — M25561 Pain in right knee: Secondary | ICD-10-CM | POA: Diagnosis not present

## 2020-02-29 NOTE — Telephone Encounter (Signed)
ATC, unable to leave a voice mail.  

## 2020-03-05 DIAGNOSIS — J3089 Other allergic rhinitis: Secondary | ICD-10-CM | POA: Diagnosis not present

## 2020-03-05 DIAGNOSIS — J301 Allergic rhinitis due to pollen: Secondary | ICD-10-CM | POA: Diagnosis not present

## 2020-03-05 DIAGNOSIS — J3081 Allergic rhinitis due to animal (cat) (dog) hair and dander: Secondary | ICD-10-CM | POA: Diagnosis not present

## 2020-03-14 DIAGNOSIS — J3089 Other allergic rhinitis: Secondary | ICD-10-CM | POA: Diagnosis not present

## 2020-03-14 DIAGNOSIS — J3081 Allergic rhinitis due to animal (cat) (dog) hair and dander: Secondary | ICD-10-CM | POA: Diagnosis not present

## 2020-03-14 DIAGNOSIS — J301 Allergic rhinitis due to pollen: Secondary | ICD-10-CM | POA: Diagnosis not present

## 2020-03-21 ENCOUNTER — Telehealth: Payer: Self-pay | Admitting: Physical Medicine and Rehabilitation

## 2020-03-21 NOTE — Telephone Encounter (Signed)
Pt called stating Loma Sousa asked her to [facility] in regards to setting an appt; pt states she has a referral from [facility]  [phone]

## 2020-03-24 NOTE — Telephone Encounter (Signed)
Scheduled for OV on 9/29 at 1030.

## 2020-03-26 ENCOUNTER — Other Ambulatory Visit: Payer: Self-pay

## 2020-03-26 ENCOUNTER — Ambulatory Visit (INDEPENDENT_AMBULATORY_CARE_PROVIDER_SITE_OTHER): Payer: Medicare Other | Admitting: Family Medicine

## 2020-03-26 ENCOUNTER — Ambulatory Visit (INDEPENDENT_AMBULATORY_CARE_PROVIDER_SITE_OTHER)
Admission: RE | Admit: 2020-03-26 | Discharge: 2020-03-26 | Disposition: A | Discharge status: Home / Self Care | Payer: Medicare Other | Source: Ambulatory Visit | Attending: Family Medicine | Admitting: Family Medicine

## 2020-03-26 ENCOUNTER — Encounter: Payer: Self-pay | Admitting: Family Medicine

## 2020-03-26 VITALS — BP 130/80 | HR 72 | Temp 98.2°F | Ht 60.5 in | Wt 144.0 lb

## 2020-03-26 DIAGNOSIS — M1711 Unilateral primary osteoarthritis, right knee: Secondary | ICD-10-CM | POA: Diagnosis not present

## 2020-03-26 DIAGNOSIS — I10 Essential (primary) hypertension: Secondary | ICD-10-CM

## 2020-03-26 DIAGNOSIS — M5418 Radiculopathy, sacral and sacrococcygeal region: Secondary | ICD-10-CM | POA: Diagnosis not present

## 2020-03-26 DIAGNOSIS — Z23 Encounter for immunization: Secondary | ICD-10-CM | POA: Diagnosis not present

## 2020-03-26 DIAGNOSIS — R739 Hyperglycemia, unspecified: Secondary | ICD-10-CM | POA: Diagnosis not present

## 2020-03-26 DIAGNOSIS — J301 Allergic rhinitis due to pollen: Secondary | ICD-10-CM | POA: Diagnosis not present

## 2020-03-26 DIAGNOSIS — J4520 Mild intermittent asthma, uncomplicated: Secondary | ICD-10-CM

## 2020-03-26 DIAGNOSIS — J3089 Other allergic rhinitis: Secondary | ICD-10-CM | POA: Diagnosis not present

## 2020-03-26 DIAGNOSIS — J3081 Allergic rhinitis due to animal (cat) (dog) hair and dander: Secondary | ICD-10-CM | POA: Diagnosis not present

## 2020-03-26 DIAGNOSIS — M8580 Other specified disorders of bone density and structure, unspecified site: Secondary | ICD-10-CM | POA: Diagnosis not present

## 2020-03-26 DIAGNOSIS — M545 Low back pain: Secondary | ICD-10-CM | POA: Diagnosis not present

## 2020-03-26 NOTE — Addendum Note (Signed)
Addended by: Matilde Sprang on: 03/26/2020 10:50 AM   Modules accepted: Orders

## 2020-03-26 NOTE — Progress Notes (Signed)
° °  Subjective:    Patient ID: Aimee Lawrence, female    DOB: 02/15/1946, 74 y.o.   MRN: [id number]  HPI Here to follow up on issues. She feels well. Her asthma is stable and her BP has been well controlled.    Review of Systems  Constitutional: Negative.   HENT: Negative.   Eyes: Negative.   Respiratory: Negative.   Cardiovascular: Negative.   Gastrointestinal: Negative.   Genitourinary: Negative for decreased urine volume, difficulty urinating, dyspareunia, dysuria, enuresis, flank pain, frequency, hematuria, pelvic pain and urgency.  Musculoskeletal: Negative.   Skin: Negative.   Neurological: Negative.   Psychiatric/Behavioral: Negative.        Objective:   Physical Exam Constitutional:      General: She is not in acute distress.    Appearance: She is well-developed.  HENT:     Head: Normocephalic and atraumatic.     Right Ear: External ear normal.     Left Ear: External ear normal.     Nose: Nose normal.     Mouth/Throat:     Pharynx: No oropharyngeal exudate.  Eyes:     General: No scleral icterus.    Conjunctiva/sclera: Conjunctivae normal.     Pupils: Pupils are equal, round, and reactive to light.  Neck:     Thyroid: No thyromegaly.     Vascular: No JVD.  Cardiovascular:     Rate and Rhythm: Normal rate and regular rhythm.     Heart sounds: Normal heart sounds. No murmur heard.  No friction rub. No gallop.   Pulmonary:     Effort: Pulmonary effort is normal. No respiratory distress.     Breath sounds: Normal breath sounds. No wheezing or rales.  Chest:     Chest wall: No tenderness.  Abdominal:     General: Bowel sounds are normal. There is no distension.     Palpations: Abdomen is soft. There is no mass.     Tenderness: There is no abdominal tenderness. There is no guarding or rebound.  Musculoskeletal:        General: No tenderness. Normal range of motion.     Cervical back: Normal range of motion and neck supple.  Lymphadenopathy:      Cervical: No cervical adenopathy.  Skin:    General: Skin is warm and dry.     Findings: No erythema or rash.  Neurological:     Mental Status: She is alert and oriented to person, place, and time.     Cranial Nerves: No cranial nerve deficit.     Motor: No abnormal muscle tone.     Coordination: Coordination normal.     Deep Tendon Reflexes: Reflexes are normal and symmetric. Reflexes normal.  Psychiatric:        Behavior: Behavior normal.        Thought Content: Thought content normal.        Judgment: Judgment normal.           Assessment & Plan:  Her asthma, osteoarthritis and HTN are stable. We will check fasting labs today. Given a flu shot.  Alysia Penna, MD

## 2020-03-27 LAB — LIPID PANEL
Cholesterol: 190 mg/dL (ref <200)
HDL: 58 mg/dL (ref >=50)
LDL Cholesterol (Calc): 113 mg/dL (calc) — ABNORMAL HIGH
Non-HDL Cholesterol (Calc): 132 mg/dL (calc) — ABNORMAL HIGH (ref <130)
Total CHOL/HDL Ratio: 3.3 (calc) (ref <5.0)
Triglycerides: 91 mg/dL (ref <150)

## 2020-03-27 LAB — CBC WITH DIFFERENTIAL/PLATELET
Absolute Monocytes: 542 cells/uL (ref 200–950)
Basophils Absolute: 82 cells/uL (ref 0–200)
Basophils Relative: 1.3 %
Eosinophils Absolute: 321 cells/uL (ref 15–500)
Eosinophils Relative: 5.1 %
HCT: 40.2 % (ref 35.0–45.0)
Hemoglobin: 13.3 g/dL (ref 11.7–15.5)
Lymphs Abs: 1222 cells/uL (ref 850–3900)
MCH: 30.2 pg (ref 27.0–33.0)
MCHC: 33.1 g/dL (ref 32.0–36.0)
MCV: 91.4 fL (ref 80.0–100.0)
MPV: 10.5 fL (ref 7.5–12.5)
Monocytes Relative: 8.6 %
Neutro Abs: 4133 cells/uL (ref 1500–7800)
Neutrophils Relative %: 65.6 %
Platelets: 245 10*3/uL (ref 140–400)
RBC: 4.4 10*6/uL (ref 3.80–5.10)
RDW: 12.6 % (ref 11.0–15.0)
Total Lymphocyte: 19.4 %
WBC: 6.3 10*3/uL (ref 3.8–10.8)

## 2020-03-27 LAB — HEPATIC FUNCTION PANEL
AG Ratio: 1.4 (calc) (ref 1.0–2.5)
ALT: 15 U/L (ref 6–29)
AST: 17 U/L (ref 10–35)
Albumin: 3.9 g/dL (ref 3.6–5.1)
Alkaline phosphatase (APISO): 90 U/L (ref 37–153)
Bilirubin, Direct: 0.1 mg/dL (ref 0.0–0.2)
Globulin: 2.7 g/dL (calc) (ref 1.9–3.7)
Indirect Bilirubin: 0.3 mg/dL (calc) (ref 0.2–1.2)
Total Bilirubin: 0.4 mg/dL (ref 0.2–1.2)
Total Protein: 6.6 g/dL (ref 6.1–8.1)

## 2020-03-27 LAB — BASIC METABOLIC PANEL
BUN: 18 mg/dL (ref 7–25)
CO2: 30 mmol/L (ref 20–32)
Calcium: 9.2 mg/dL (ref 8.6–10.4)
Chloride: 102 mmol/L (ref 98–110)
Creat: 0.71 mg/dL (ref 0.60–0.93)
Glucose, Bld: 87 mg/dL (ref 65–99)
Potassium: 4 mmol/L (ref 3.5–5.3)
Sodium: 140 mmol/L (ref 135–146)

## 2020-03-27 LAB — HEMOGLOBIN A1C
Hgb A1c MFr Bld: 6 % of total Hgb — ABNORMAL HIGH (ref <5.7)
Mean Plasma Glucose: 126 (calc)
eAG (mmol/L): 7 (calc)

## 2020-03-27 LAB — SPECIMEN COMPROMISED

## 2020-03-27 LAB — TSH: TSH: 1.8 mIU/L (ref 0.40–4.50)

## 2020-04-07 DIAGNOSIS — M25561 Pain in right knee: Secondary | ICD-10-CM | POA: Diagnosis not present

## 2020-04-07 DIAGNOSIS — T8484XD Pain due to internal orthopedic prosthetic devices, implants and grafts, subsequent encounter: Secondary | ICD-10-CM | POA: Diagnosis not present

## 2020-04-07 DIAGNOSIS — M25562 Pain in left knee: Secondary | ICD-10-CM | POA: Diagnosis not present

## 2020-04-07 DIAGNOSIS — M170 Bilateral primary osteoarthritis of knee: Secondary | ICD-10-CM | POA: Diagnosis not present

## 2020-04-09 ENCOUNTER — Encounter: Payer: Self-pay | Admitting: Physical Medicine and Rehabilitation

## 2020-04-09 ENCOUNTER — Other Ambulatory Visit: Payer: Self-pay

## 2020-04-09 ENCOUNTER — Ambulatory Visit: Payer: Medicare Other | Admitting: Physical Medicine and Rehabilitation

## 2020-04-09 VITALS — BP 121/75 | HR 76

## 2020-04-09 DIAGNOSIS — Z96651 Presence of right artificial knee joint: Secondary | ICD-10-CM

## 2020-04-09 DIAGNOSIS — G8929 Other chronic pain: Secondary | ICD-10-CM

## 2020-04-09 DIAGNOSIS — M25561 Pain in right knee: Secondary | ICD-10-CM

## 2020-04-09 DIAGNOSIS — J301 Allergic rhinitis due to pollen: Secondary | ICD-10-CM | POA: Diagnosis not present

## 2020-04-09 DIAGNOSIS — J3081 Allergic rhinitis due to animal (cat) (dog) hair and dander: Secondary | ICD-10-CM | POA: Diagnosis not present

## 2020-04-09 DIAGNOSIS — J3089 Other allergic rhinitis: Secondary | ICD-10-CM | POA: Diagnosis not present

## 2020-04-09 NOTE — Progress Notes (Signed)
Right knee pain. Had knee replacement or 4 years ago. No relief from surgery. Right anterior knee pain. Numeric Pain Rating Scale and Functional Assessment Average Pain 4 Pain Right Now 4 My pain is constant, dull and aching Pain is worse with: walking Pain improves with: nothing   In the last MONTH (on 0-10 scale) has pain interfered with the following?  1. General activity like being  able to carry out your everyday physical activities such as walking, climbing stairs, carrying groceries, or moving a chair?  Rating(5)  2. Relation with others like being able to carry out your usual social activities and roles such as  activities at home, at work and in your community. Rating(5)  3. Enjoyment of life such that you have  been bothered by emotional problems such as feeling anxious, depressed or irritable?  Rating(4)

## 2020-04-10 ENCOUNTER — Encounter: Payer: Self-pay | Admitting: Physical Medicine and Rehabilitation

## 2020-04-10 ENCOUNTER — Ambulatory Visit (INDEPENDENT_AMBULATORY_CARE_PROVIDER_SITE_OTHER): Payer: Medicare Other | Admitting: Physical Medicine and Rehabilitation

## 2020-04-10 ENCOUNTER — Ambulatory Visit: Payer: Self-pay

## 2020-04-10 VITALS — BP 114/68 | HR 81

## 2020-04-10 DIAGNOSIS — M25561 Pain in right knee: Secondary | ICD-10-CM | POA: Diagnosis not present

## 2020-04-10 DIAGNOSIS — G8929 Other chronic pain: Secondary | ICD-10-CM | POA: Diagnosis not present

## 2020-04-10 NOTE — Progress Notes (Signed)
Aimee Lawrence - 74 y.o. female MRN [id number]  Date of birth: 04-21-1946  Office Visit Note: Visit Date: 04/09/2020 PCP: [name], MD Referred by: [name], MD  Subjective: Chief Complaint  Patient presents with  . Right Knee - Pain   HPI: Aimee Lawrence is a 74 y.o. female who comes in today For evaluation management of chronic recalcitrant right knee pain despite prior total knee replacement and conservative care including medication management and physical therapy and time.  She comes in today at the request of Dr. Teresa Coombs.  She reports severe right knee pain constant dull and aching.  Somewhat worse with walking really not better with anything medication wise or therapy etc.  She reports lack of range of motion particularly with knee flexion.  She has good extension.  She does not feel this is unsteady or slipping.  She did have right knee replacement 4 years ago.  This was performed by Dr. Christena Flake.  To further complicate the issues she has allergy to nonsteroidal anti-inflammatories.  She does use Tylenol as well as tramadol at times prescribed by her primary care physician Dr. Alysia Penna.  She denies any radicular type component to this no pain from the hip down no pain from the back down.  No paresthesias no numbness.  No left-sided complaints.  She has had no focal weakness no trauma.  Review of Systems  Musculoskeletal: Positive for joint pain.  All other systems reviewed and are negative.  Otherwise per HPI.  Assessment & Plan: Visit Diagnoses:  1. Chronic pain of right knee   2. History of total knee arthroplasty, right     Plan: Findings:  Chronic recalcitrant right knee pain despite right total knee replacement by Dr. Christena Flake in 2017.  She is intolerant of nonsteroidal anti-inflammatories and no relief from other medications.  She has had multiple bouts of physical therapy and exercise.  She has tried other modalities without much  help.  At this point we went over potential for radiofrequency ablation of the right knee as a last resort procedure.  The first step would be approval for fluoroscopically guided nerve blocks of the genicular nerves.  This will be done with pain diary.  If she achieves more than 50% relief then would look at radiofrequency ablation of the same genicular nerves.    Meds & Orders: No orders of the defined types were placed in this encounter.  No orders of the defined types were placed in this encounter.   Follow-up: Return for Genicular nerve block of the right knee.   Procedures: No procedures performed  No notes on file   Clinical History: No specialty comments available.   She reports that she has never smoked. She has never used smokeless tobacco.  Recent Labs    03/26/20 1044  HGBA1C 6.0*    Objective:  VS:  HT:    WT:   BMI:     BP:121/75  HR:76bpm  TEMP: ( )  RESP:  Physical Exam Vitals and nursing note reviewed.  Constitutional:      General: She is not in acute distress.    Appearance: Normal appearance. She is well-developed. She is not ill-appearing.  HENT:     Head: Normocephalic and atraumatic.  Eyes:     Conjunctiva/sclera: Conjunctivae normal.     Pupils: Pupils are equal, round, and reactive to light.  Cardiovascular:     Rate and Rhythm: Normal rate.  Pulses: Normal pulses.  Pulmonary:     Effort: Pulmonary effort is normal.  Musculoskeletal:     Right lower leg: Edema present.     Left lower leg: Edema present.     Comments: Examination of the right knee shows anterior surgical scar without any joint line tenderness or instability.  Good varus and valgus stability.  She does lack range of motion with flexion but good extension.  Skin:    General: Skin is warm and dry.     Findings: No erythema or rash.  Neurological:     General: No focal deficit present.     Mental Status: She is alert and oriented to person, place, and time.     Sensory: No  sensory deficit.     Motor: No weakness or abnormal muscle tone.     Coordination: Coordination normal.     Gait: Gait abnormal.  Psychiatric:        Mood and Affect: Mood normal.        Behavior: Behavior normal.     Ortho Exam  Imaging: No results found.  Past Medical/Family/Surgical/Social History: Medications & Allergies reviewed per EMR, new medications updated. Patient Active Problem List   Diagnosis Date Noted  . Abnormal finding on imaging 08/29/2017  . Right hip pain 08/15/2017  . S/P TKR (total knee replacement), right 08/11/2017  . Osteoarthritis of hip 08/11/2017  . Primary localized osteoarthritis of right knee 06/15/2016  . Primary osteoarthritis of right knee 06/15/2016  . Nasal polyps 11/20/2012  . Essential hypertension 05/28/2010  . DEPRESSION 02/28/2007  . Asthma 02/28/2007  . Osteopenia 02/28/2007  . Personal hx colon polyps 02/28/2007   Past Medical History:  Diagnosis Date  . Allergy    takes Doxycycline daily and uses Flonase daily  . Arthritis   . Asthma    Albuterol inhaler as needed.  Marland Kitchen Dyspnea    rarely and sitting  . GERD (gastroesophageal reflux disease)    not on any meds  . History of bronchitis   . History of shingles   . Hx of colonic polyps    benign  . Joint pain   . Joint swelling   . Osteopenia    takes Vit D3 and Calcium daily  . Primary localized osteoarthritis of right knee    Family History  Problem Relation Age of Onset  . Alcohol abuse Other   . Arthritis Other   . Diabetes Other   . Heart disease Other   . Breast cancer Mother   . Pneumonia Mother   . Colon cancer Neg Hx   . Stomach cancer Neg Hx    Past Surgical History:  Procedure Laterality Date  . CHOLECYSTECTOMY  2007   lap choli  . COLONOSCOPY  10/22/2015   per Dr. Carlean Purl, no polyps, repeat in 5 yrs   . KNEE CLOSED REDUCTION Right 09/14/2016   Procedure: CLOSED MANIPULATION KNEE;  Surgeon: Melrose Nakayama, MD;  Location: [place];  Service: Orthopedics;   Laterality: Right;  . NASAL POLYP SURGERY  2007  . SINUS ENDO W/FUSION  06/27/2012   Procedure: ENDOSCOPIC SINUS SURGERY WITH FUSION NAVIGATION;  Surgeon: Rozetta Nunnery, MD;  Location: [place];  Service: ENT;  Laterality: N/A;  FESS FUSION PROTOCOL  . TOTAL KNEE ARTHROPLASTY Right 06/15/2016   Procedure: RIGHT TOTAL KNEE ARTHROPLASTY;  Surgeon: Melrose Nakayama, MD;  Location: [place];  Service: Orthopedics;  Laterality: Right;   Social History   Occupational History  .  Occupation: Retired Pharmacist, hospital  Tobacco Use  . Smoking status: Never Smoker  . Smokeless tobacco: Never Used  Substance and Sexual Activity  . Alcohol use: No    Alcohol/week: 0.0 standard drinks    Comment: no etoh   . Drug use: No  . Sexual activity: Not on file

## 2020-04-10 NOTE — Progress Notes (Signed)
Pt state right knee pain. Pt state walking for a long period of time makes the pain worse. Pt states sitting down to rest and pain meds helps ease the pain.  Numeric Pain Rating Scale and Functional Assessment Average Pain 4   In the last MONTH (on 0-10 scale) has pain interfered with the following?  1. General activity like being  able to carry out your everyday physical activities such as walking, climbing stairs, carrying groceries, or moving a chair?  Rating(6)   +Driver, -BT, -Dye Allergies.

## 2020-04-14 NOTE — Progress Notes (Signed)
Aimee Lawrence - 74 y.o. female MRN [id number]  Date of birth: 1945/08/12  Office Visit Note: Visit Date: 04/10/2020 PCP: [name], MD Referred by: [name], MD  Subjective: Chief Complaint  Patient presents with  . Right Knee - Pain   HPI:  Aimee Lawrence is a 74 y.o. female who comes in today For diagnostic genicular nerve blocks of the right knee.  Please see our prior notes for further details and justification but basically patient is status post failed knee replacement with continued severe chronic recalcitrant right knee pain.  Depending on relief after pain diary would look at ablation of the same nerves.  This would be done as part of a comprehensive pain management program with in-house physical therapy and access to biopsychosocial counseling if needed.  Referring: Dr. Teresa Coombs  ROS Otherwise per HPI.  Assessment & Plan: Visit Diagnoses:  1. Chronic pain of right knee     Plan: No additional findings.   Meds & Orders: No orders of the defined types were placed in this encounter.   Orders Placed This Encounter  Procedures  . Nerve Block  . XR C-ARM NO REPORT    Follow-up: Return for Review Pain Diary.   Procedures: No procedures performed  Genicular Nerve Blockade with Fluoroscopic Guidance  Patient: Aimee Lawrence      Date of Birth: [birth year] MRN: [id number] PCP: [name], MD      Visit Date: 04/10/2020   Universal Protocol:    Date/Time: 10/04/215:14 AM  Consent Given By: the patient  Position: supine  Additional Comments: Vital signs were monitored before and after the procedure. Patient was prepped and draped in the usual sterile fashion. The correct patient, procedure, and site was verified.   Injection Procedure Details:  Procedure Site One Meds Administered: No orders of the defined types were placed in this encounter.    Laterality: Right  Location/Site:  Knee, medial and lateral superior  genicular nerve and inferior medial genicular nerve  Needle size: 22 g  Needle type: Spinal  Needle Placement: As noted below.  Procedure Details: The fluoroscope was positioned in a 90 degree anteroposterior (AP) fluoroscopic view and then tilted to obtain a true AP view of the knee joint.  The target areas are the mid-point of the curve formed where the femoral shaft and epicondyles meet as well as the midpoint of the curve on the medial side where the tibial shaft meets the plateau.  Using fluoroscopic guidance the target areas were infiltrated using a 27-gauge needle and 1-2 mL of 1% lidocaine without epinephrine to provide local anesthetic.  Then, for each target area a 3-1/2 inch 22-gauge spinal needle was driven down under intermittent fluoroscopic guidance to the periosteum of the target site.  Once all 3 needles were in place in the AP view, a lateral view was obtained at each needle was driven control to the midpoint of the shafts respectively.  A spot film was obtained of the lateral view and saved.  Next, an AP view was obtained once again to confirm placement.  A spot film then was obtained and saved.  After fluoroscopic imaging was used to determine and confirm placement, 0.5-1 mL of injectate was delivered at each target location.  The patient was given a pain diary to complete and return to the office.      Additional Comments:  The patient tolerated the procedure well Dressing: Band-Aid    Post-procedure details: Patient was  observed during the procedure. Post-procedure instructions were reviewed. Follow-Up Instructions:  Patient left the clinic in stable condition.           Clinical History: No specialty comments available.     Objective:  VS:  HT:    WT:   BMI:     BP:114/68  HR:81bpm  TEMP: ( )  RESP:  Physical Exam Vitals and nursing note reviewed.  Constitutional:      General: She is not in acute distress.    Appearance: Normal appearance.  She is well-developed. She is obese. She is not ill-appearing.  HENT:     Head: Normocephalic and atraumatic.  Eyes:     Conjunctiva/sclera: Conjunctivae normal.     Pupils: Pupils are equal, round, and reactive to light.  Cardiovascular:     Rate and Rhythm: Normal rate.     Pulses: Normal pulses.  Pulmonary:     Effort: Pulmonary effort is normal.  Musculoskeletal:        General: Tenderness present.     Right lower leg: Edema present.     Left lower leg: Edema present.     Comments: Right knee shows well-healed surgical scar and does lack range of motion particularly with knee flexion.  Skin:    General: Skin is warm and dry.     Findings: No erythema or rash.  Neurological:     General: No focal deficit present.     Mental Status: She is alert and oriented to person, place, and time.     Sensory: No sensory deficit.     Motor: No weakness or abnormal muscle tone.     Coordination: Coordination normal.     Gait: Gait abnormal.  Psychiatric:        Mood and Affect: Mood normal.        Behavior: Behavior normal.      Imaging: No results found.

## 2020-04-14 NOTE — Procedures (Signed)
Genicular Nerve Blockade with Fluoroscopic Guidance  Patient: Aimee Lawrence      Date of Birth: [birth year] MRN: [id number] PCP: [name], MD      Visit Date: 04/10/2020   Universal Protocol:    Date/Time: 10/04/215:14 AM  Consent Given By: the patient  Position: supine  Additional Comments: Vital signs were monitored before and after the procedure. Patient was prepped and draped in the usual sterile fashion. The correct patient, procedure, and site was verified.   Injection Procedure Details:  Procedure Site One Meds Administered: No orders of the defined types were placed in this encounter.    Laterality: Right  Location/Site:  Knee, medial and lateral superior genicular nerve and inferior medial genicular nerve  Needle size: 22 g  Needle type: Spinal  Needle Placement: As noted below.  Procedure Details: The fluoroscope was positioned in a 90 degree anteroposterior (AP) fluoroscopic view and then tilted to obtain a true AP view of the knee joint.  The target areas are the mid-point of the curve formed where the femoral shaft and epicondyles meet as well as the midpoint of the curve on the medial side where the tibial shaft meets the plateau.  Using fluoroscopic guidance the target areas were infiltrated using a 27-gauge needle and 1-2 mL of 1% lidocaine without epinephrine to provide local anesthetic.  Then, for each target area a 3-1/2 inch 22-gauge spinal needle was driven down under intermittent fluoroscopic guidance to the periosteum of the target site.  Once all 3 needles were in place in the AP view, a lateral view was obtained at each needle was driven control to the midpoint of the shafts respectively.  A spot film was obtained of the lateral view and saved.  Next, an AP view was obtained once again to confirm placement.  A spot film then was obtained and saved.  After fluoroscopic imaging was used to determine and confirm placement, 0.5-1 mL of injectate  was delivered at each target location.  The patient was given a pain diary to complete and return to the office.      Additional Comments:  The patient tolerated the procedure well Dressing: Band-Aid    Post-procedure details: Patient was observed during the procedure. Post-procedure instructions were reviewed. Follow-Up Instructions:  Patient left the clinic in stable condition.

## 2020-04-22 DIAGNOSIS — J3089 Other allergic rhinitis: Secondary | ICD-10-CM | POA: Diagnosis not present

## 2020-04-22 DIAGNOSIS — J301 Allergic rhinitis due to pollen: Secondary | ICD-10-CM | POA: Diagnosis not present

## 2020-04-22 DIAGNOSIS — J3081 Allergic rhinitis due to animal (cat) (dog) hair and dander: Secondary | ICD-10-CM | POA: Diagnosis not present

## 2020-04-25 ENCOUNTER — Telehealth: Payer: Self-pay | Admitting: Physical Medicine and Rehabilitation

## 2020-04-25 NOTE — Telephone Encounter (Signed)
Called pt and couldn't leave message.

## 2020-04-25 NOTE — Telephone Encounter (Signed)
Pt not req Auth#, pt can be sch.

## 2020-04-25 NOTE — Telephone Encounter (Signed)
Needs auth and scheduling for [phone]- right genicular nerve RFA.

## 2020-04-28 NOTE — Telephone Encounter (Signed)
Called pt and mail boxes full and couldn't leave a message.

## 2020-04-29 NOTE — Telephone Encounter (Signed)
Called pt and mail boxes full and couldn't leave a message. #2

## 2020-05-01 NOTE — Telephone Encounter (Signed)
Called pt and mail boxes full and couldn't leave a message. #3

## 2020-05-09 DIAGNOSIS — J301 Allergic rhinitis due to pollen: Secondary | ICD-10-CM | POA: Diagnosis not present

## 2020-05-09 DIAGNOSIS — J3089 Other allergic rhinitis: Secondary | ICD-10-CM | POA: Diagnosis not present

## 2020-05-09 DIAGNOSIS — J3081 Allergic rhinitis due to animal (cat) (dog) hair and dander: Secondary | ICD-10-CM | POA: Diagnosis not present

## 2020-05-14 DIAGNOSIS — J3081 Allergic rhinitis due to animal (cat) (dog) hair and dander: Secondary | ICD-10-CM | POA: Diagnosis not present

## 2020-05-14 DIAGNOSIS — J301 Allergic rhinitis due to pollen: Secondary | ICD-10-CM | POA: Diagnosis not present

## 2020-05-14 DIAGNOSIS — J3089 Other allergic rhinitis: Secondary | ICD-10-CM | POA: Diagnosis not present

## 2020-05-23 DIAGNOSIS — J3089 Other allergic rhinitis: Secondary | ICD-10-CM | POA: Diagnosis not present

## 2020-05-23 DIAGNOSIS — J301 Allergic rhinitis due to pollen: Secondary | ICD-10-CM | POA: Diagnosis not present

## 2020-05-23 DIAGNOSIS — J3081 Allergic rhinitis due to animal (cat) (dog) hair and dander: Secondary | ICD-10-CM | POA: Diagnosis not present

## 2020-05-27 ENCOUNTER — Telehealth: Payer: Self-pay | Admitting: Family Medicine

## 2020-05-27 NOTE — Telephone Encounter (Signed)
Attempted to schedule AWV. Unable to LVM.  Will try at later time.   Left message for patient to schedule Annual Wellness Visit.  Please schedule with Nurse Health Advisor Shannon Crews, RN at [facility]  

## 2020-05-30 ENCOUNTER — Telehealth: Payer: Self-pay

## 2020-05-30 NOTE — Telephone Encounter (Signed)
Have called pt mult times to sch her Right knee RFA, that not req Auth for her insurance. Couldn't leave vioce message due to mailbox being full. Called pt on 10/11, 10/19, 10/26 and 11/3. Called pt today and was able to leave message.

## 2020-06-02 DIAGNOSIS — J4520 Mild intermittent asthma, uncomplicated: Secondary | ICD-10-CM | POA: Diagnosis not present

## 2020-06-02 DIAGNOSIS — J3089 Other allergic rhinitis: Secondary | ICD-10-CM | POA: Diagnosis not present

## 2020-06-02 DIAGNOSIS — J3081 Allergic rhinitis due to animal (cat) (dog) hair and dander: Secondary | ICD-10-CM | POA: Diagnosis not present

## 2020-06-02 DIAGNOSIS — J301 Allergic rhinitis due to pollen: Secondary | ICD-10-CM | POA: Diagnosis not present

## 2020-06-03 ENCOUNTER — Telehealth: Payer: Self-pay | Admitting: Physical Medicine and Rehabilitation

## 2020-06-03 NOTE — Telephone Encounter (Signed)
Patient called needing to schedule an appointment with Dr Ernestina Patches. The number to contact patient is [phone]

## 2020-06-04 NOTE — Telephone Encounter (Signed)
Called pt back and lvm #1/ Also inbox pt to return call to schedule appointment.

## 2020-06-10 NOTE — Telephone Encounter (Signed)
Called pt and lvm #2. Closing Phone call.

## 2020-06-11 DIAGNOSIS — J301 Allergic rhinitis due to pollen: Secondary | ICD-10-CM | POA: Diagnosis not present

## 2020-06-11 DIAGNOSIS — J3081 Allergic rhinitis due to animal (cat) (dog) hair and dander: Secondary | ICD-10-CM | POA: Diagnosis not present

## 2020-06-11 DIAGNOSIS — J3089 Other allergic rhinitis: Secondary | ICD-10-CM | POA: Diagnosis not present

## 2020-06-25 ENCOUNTER — Other Ambulatory Visit: Payer: Self-pay | Admitting: Family Medicine

## 2020-06-25 DIAGNOSIS — J301 Allergic rhinitis due to pollen: Secondary | ICD-10-CM | POA: Diagnosis not present

## 2020-06-25 DIAGNOSIS — J3089 Other allergic rhinitis: Secondary | ICD-10-CM | POA: Diagnosis not present

## 2020-06-25 DIAGNOSIS — J3081 Allergic rhinitis due to animal (cat) (dog) hair and dander: Secondary | ICD-10-CM | POA: Diagnosis not present

## 2020-06-25 NOTE — Telephone Encounter (Signed)
Pt is calling in stating that she is out of Rx tramadol Veatrice Bourbon) 50 MG   Pharm: [facility] [address]

## 2020-07-13 NOTE — Progress Notes (Deleted)
Virtual Visit via Video Note  I connected with@ on jan 2 2022at  8:30 AM EST by a video enabled telemedicine application and verified that I am speaking with the correct person using two identifiers. Location patient: home Location provider:work or home office Persons participating in the virtual visit: patient, provider  [place] national recommendations  regarding COVID 19 pandemic   video visit is advised over in office visit for this patient.  Patient aware  of the limitations of evaluation and management by telemedicine and  availability of in person appointments. and agreed to proceed.   HPI: Aimee Lawrence presents for video visit  PCP  Dr Sarajane Jews  appt NA     ROS: See pertinent positives and negatives per HPI.  Past Medical History:  Diagnosis Date  . Allergy    takes Doxycycline daily and uses Flonase daily  . Arthritis   . Asthma    Albuterol inhaler as needed.  Marland Kitchen Dyspnea    rarely and sitting  . GERD (gastroesophageal reflux disease)    not on any meds  . History of bronchitis   . History of shingles   . Hx of colonic polyps    benign  . Joint pain   . Joint swelling   . Osteopenia    takes Vit D3 and Calcium daily  . Primary localized osteoarthritis of right knee     Past Surgical History:  Procedure Laterality Date  . CHOLECYSTECTOMY  2007   lap choli  . COLONOSCOPY  10/22/2015   per Dr. Carlean Purl, no polyps, repeat in 5 yrs   . KNEE CLOSED REDUCTION Right 09/14/2016   Procedure: CLOSED MANIPULATION KNEE;  Surgeon: Melrose Nakayama, MD;  Location: [facility];  Service: Orthopedics;  Laterality: Right;  . NASAL POLYP SURGERY  2007  . SINUS ENDO W/FUSION  06/27/2012   Procedure: ENDOSCOPIC SINUS SURGERY WITH FUSION NAVIGATION;  Surgeon: Rozetta Nunnery, MD;  Location: [facility];  Service: ENT;  Laterality: N/A;  FESS FUSION PROTOCOL  . TOTAL KNEE ARTHROPLASTY Right 06/15/2016   Procedure: RIGHT TOTAL KNEE ARTHROPLASTY;  Surgeon: Melrose Nakayama,  MD;  Location: [facility];  Service: Orthopedics;  Laterality: Right;    Family History  Problem Relation Age of Onset  . Alcohol abuse Other   . Arthritis Other   . Diabetes Other   . Heart disease Other   . Breast cancer Mother   . Pneumonia Mother   . Colon cancer Neg Hx   . Stomach cancer Neg Hx     Social History   Tobacco Use  . Smoking status: Never Smoker  . Smokeless tobacco: Never Used  Substance Use Topics  . Alcohol use: No    Alcohol/week: 0.0 standard drinks    Comment: no etoh   . Drug use: No      Current Outpatient Medications:  .  traMADol (ULTRAM) 50 MG tablet, TAKE TWO TABLETS BY MOUTH EVERY 8 HOURS AS NEEDED FOR MODERATE PAIN, Disp: 180 tablet, Rfl: 1 .  acetaminophen (TYLENOL) 500 MG tablet, Take 500 mg by mouth every 6 (six) hours as needed for moderate pain., Disp: , Rfl:  .  albuterol (PROVENTIL HFA;VENTOLIN HFA) 108 (90 Base) MCG/ACT inhaler, Inhale 2 puffs into the lungs every 6 (six) hours as needed for wheezing or shortness of breath., Disp: 1 Inhaler, Rfl: 1 .  Calcium Carbonate-Vitamin D (CALCIUM 500/D PO), Take 2 tablets by mouth daily. 500 mg-200 mg, Disp: , Rfl:  .  Cholecalciferol (  VITAMIN D3) 2000 units capsule, Take 4,000 Units by mouth daily. , Disp: , Rfl:  .  FIBER SELECT GUMMIES PO, Take 1 tablet by mouth daily., Disp: , Rfl:  .  fluticasone (FLONASE) 50 MCG/ACT nasal spray, Place 1 spray into both nostrils daily. , Disp: , Rfl:  .  montelukast (SINGULAIR) 10 MG tablet, Take 1 tablet by mouth once daily or as directed., Disp: 30 tablet, Rfl: 3  EXAM: BP Readings from Last 3 Encounters:  04/10/20 114/68  04/09/20 121/75  03/26/20 130/80    VITALS per patient if applicable:  GENERAL: alert, oriented, appears well and in no acute distress  HEENT: atraumatic, conjunttiva clear, no obvious abnormalities on inspection of external nose and ears  NECK: normal movements of the head and neck  LUNGS: on inspection no signs of respiratory  distress, breathing rate appears normal, no obvious gross SOB, gasping or wheezing  CV: no obvious cyanosis  MS: moves all visible extremities without noticeable abnormality  PSYCH/NEURO: pleasant and cooperative, no obvious depression or anxiety, speech and thought processing grossly intact Lab Results  Component Value Date   WBC 6.3 03/26/2020   HGB 13.3 03/26/2020   HCT 40.2 03/26/2020   PLT 245 03/26/2020   GLUCOSE 87 03/26/2020   CHOL 190 03/26/2020   TRIG 91 03/26/2020   HDL 58 03/26/2020   LDLDIRECT 158.7 05/28/2010   LDLCALC 113 (H) 03/26/2020   ALT 15 03/26/2020   AST 17 03/26/2020   NA 140 03/26/2020   K 4.0 03/26/2020   CL 102 03/26/2020   CREATININE 0.71 03/26/2020   BUN 18 03/26/2020   CO2 30 03/26/2020   TSH 1.80 03/26/2020   INR 0.99 06/04/2016   HGBA1C 6.0 (H) 03/26/2020    ASSESSMENT AND PLAN:  Discussed the following assessment and plan:  No diagnosis found.  Counseled.   Expectant management and discussion of plan and treatment with opportunity to ask questions and all were answered. The patient agreed with the plan and demonstrated an understanding of the instructions.   Advised to call back or seek an in-person evaluation if worsening  or having  further concerns . No follow-ups on file. I provided *** minutes of non-face-to-face time during this encounter.   Berniece Andreas, MD

## 2020-07-14 ENCOUNTER — Telehealth: Payer: Self-pay

## 2020-07-14 ENCOUNTER — Telehealth: Payer: Medicare Other | Admitting: Internal Medicine

## 2020-07-14 NOTE — Telephone Encounter (Signed)
Called patient for her VV and she advised me that she was feeling better and didn't need to talk to the doctor.  Asked to cancel the appointment so got front desk to cancel it.  Dm/cma

## 2020-07-17 DIAGNOSIS — J3089 Other allergic rhinitis: Secondary | ICD-10-CM | POA: Diagnosis not present

## 2020-07-17 DIAGNOSIS — J301 Allergic rhinitis due to pollen: Secondary | ICD-10-CM | POA: Diagnosis not present

## 2020-07-17 DIAGNOSIS — J3081 Allergic rhinitis due to animal (cat) (dog) hair and dander: Secondary | ICD-10-CM | POA: Diagnosis not present

## 2020-07-24 ENCOUNTER — Telehealth: Payer: Self-pay | Admitting: Family Medicine

## 2020-07-24 DIAGNOSIS — J3081 Allergic rhinitis due to animal (cat) (dog) hair and dander: Secondary | ICD-10-CM | POA: Diagnosis not present

## 2020-07-24 DIAGNOSIS — J301 Allergic rhinitis due to pollen: Secondary | ICD-10-CM | POA: Diagnosis not present

## 2020-07-24 DIAGNOSIS — J3089 Other allergic rhinitis: Secondary | ICD-10-CM | POA: Diagnosis not present

## 2020-07-24 NOTE — Telephone Encounter (Signed)
Tried calling pt to schedule Medicare Annual Wellness Visit (AWV) either virtually or in office.  No answer voice mail full   Last AWV ; please schedule at anytime with [facility] Nurse Health Advisor 1 or 2   This should be a 45 minute visit.

## 2020-07-30 DIAGNOSIS — J3081 Allergic rhinitis due to animal (cat) (dog) hair and dander: Secondary | ICD-10-CM | POA: Diagnosis not present

## 2020-07-30 DIAGNOSIS — J301 Allergic rhinitis due to pollen: Secondary | ICD-10-CM | POA: Diagnosis not present

## 2020-07-30 DIAGNOSIS — J3089 Other allergic rhinitis: Secondary | ICD-10-CM | POA: Diagnosis not present

## 2020-08-01 DIAGNOSIS — J3081 Allergic rhinitis due to animal (cat) (dog) hair and dander: Secondary | ICD-10-CM | POA: Diagnosis not present

## 2020-08-01 DIAGNOSIS — J301 Allergic rhinitis due to pollen: Secondary | ICD-10-CM | POA: Diagnosis not present

## 2020-08-01 DIAGNOSIS — J3089 Other allergic rhinitis: Secondary | ICD-10-CM | POA: Diagnosis not present

## 2020-08-12 ENCOUNTER — Ambulatory Visit: Payer: Medicare Other | Admitting: Physical Medicine and Rehabilitation

## 2020-08-12 ENCOUNTER — Other Ambulatory Visit: Payer: Self-pay

## 2020-08-12 ENCOUNTER — Encounter: Payer: Self-pay | Admitting: Physical Medicine and Rehabilitation

## 2020-08-12 ENCOUNTER — Ambulatory Visit: Payer: Self-pay

## 2020-08-12 VITALS — BP 119/74 | HR 89

## 2020-08-12 DIAGNOSIS — Z96651 Presence of right artificial knee joint: Secondary | ICD-10-CM

## 2020-08-12 DIAGNOSIS — G8929 Other chronic pain: Secondary | ICD-10-CM | POA: Diagnosis not present

## 2020-08-12 DIAGNOSIS — M25561 Pain in right knee: Secondary | ICD-10-CM

## 2020-08-12 MED ORDER — METHYLPREDNISOLONE ACETATE 80 MG/ML IJ SUSP
80.0000 mg | Freq: Once | INTRAMUSCULAR | Status: AC
  Administered 2020-08-12: 80 mg

## 2020-08-12 NOTE — Patient Instructions (Signed)

## 2020-08-12 NOTE — Progress Notes (Signed)
Pt state right knee pain. Pt state walking and standing for a long period of time makes the pain worse. Pt state she take pain meds to help ease her pain.  Numeric Pain Rating Scale and Functional Assessment Average Pain 4   In the last MONTH (on 0-10 scale) has pain interfered with the following?  1. General activity like being  able to carry out your everyday physical activities such as walking, climbing stairs, carrying groceries, or moving a chair?  Rating(8)   +Driver, -BT, -Dye Allergies.

## 2020-08-20 DIAGNOSIS — J3081 Allergic rhinitis due to animal (cat) (dog) hair and dander: Secondary | ICD-10-CM | POA: Diagnosis not present

## 2020-08-20 DIAGNOSIS — J3089 Other allergic rhinitis: Secondary | ICD-10-CM | POA: Diagnosis not present

## 2020-08-20 DIAGNOSIS — J301 Allergic rhinitis due to pollen: Secondary | ICD-10-CM | POA: Diagnosis not present

## 2020-08-26 DIAGNOSIS — J301 Allergic rhinitis due to pollen: Secondary | ICD-10-CM | POA: Diagnosis not present

## 2020-08-26 DIAGNOSIS — J3081 Allergic rhinitis due to animal (cat) (dog) hair and dander: Secondary | ICD-10-CM | POA: Diagnosis not present

## 2020-08-26 DIAGNOSIS — J3089 Other allergic rhinitis: Secondary | ICD-10-CM | POA: Diagnosis not present

## 2020-09-11 ENCOUNTER — Telehealth: Payer: Self-pay | Admitting: Family Medicine

## 2020-09-11 NOTE — Telephone Encounter (Signed)
Tried to call patient to  schedule Medicare Annual Wellness Visit (AWV) either virtually or in office. No answer   Last AWV 06/11/16 please schedule at anytime with [facility] Nurse Health Advisor 1 or 2   This should be a 45 minute visit.

## 2020-09-17 NOTE — Progress Notes (Signed)
Aimee Lawrence - 75 y.o. female MRN [id number]  Date of birth: Nov 22, 1945  Office Visit Note: Visit Date: 08/12/2020 PCP: [name], MD Referred by: [name], MD  Subjective: Chief Complaint  Patient presents with  . Right Knee - Pain   HPI:  Aimee Lawrence is a 75 y.o. female who comes in today for planned radiofrequency ablation of the Right knee/genicular nerve branches.  Patient has had double diagnostic blocks of the superior medial, superior lateral and inferior medial genicular nerves with more than 50% relief.  These are documented on pain diary.  They have had Right knee pain for quite some time, more than 3 months, which has been an ongoing situation with recalcitrant right knee pain.   She is status post right total knee arthroplasty they have no radicular pain and no prior joint replacement.  They have had physical therapy as well as home exercise program. They have had multiple knee injections, both cortisone and Viscosupplementation.  Accordingly they meet all the criteria and qualification for for radiofrequency ablation and we are going to complete this today hopefully for more longer term relief as part of comprehensive management program.  ROS Otherwise per HPI.  Assessment & Plan: Visit Diagnoses:    ICD-10-CM   1. Chronic pain of right knee  M25.561 XR C-ARM NO REPORT   G89.29 Nerve Block    methylPREDNISolone acetate (DEPO-MEDROL) injection 80 mg  2. History of total knee arthroplasty, right  Z96.651 XR C-ARM NO REPORT    Nerve Block    methylPREDNISolone acetate (DEPO-MEDROL) injection 80 mg    Plan: No additional findings.   Meds & Orders:  Meds ordered this encounter  Medications  . methylPREDNISolone acetate (DEPO-MEDROL) injection 80 mg    Orders Placed This Encounter  Procedures  . Nerve Block  . XR C-ARM NO REPORT    Follow-up: Return for visit to requesting physician as needed.   Procedures: No procedures performed   Genicular Nerve Neurotomy with Fluoroscopic Guidance  Patient: Aimee Lawrence      Date of Birth: Nov 29, 1945 MRN: [id number] PCP: [name], MD      Visit Date: 08/12/2020   Universal Protocol:    Date/Time: 03/09/226:01 AM  Consent Given By: the patient  Position: SUPINE  Additional Comments: Vital signs were monitored before and after the procedure. Patient was prepped and draped in the usual sterile fashion. The correct patient, procedure, and site was verified.  Injection Procedure Details:   Procedure diagnoses: Chronic pain of right knee [M25.561, G89.29]    Meds Administered:  Meds ordered this encounter  Medications  . methylPREDNISolone acetate (DEPO-MEDROL) injection 80 mg    Laterality: Right  Location/Site:  Knee: respective superior medial, superior lateral and inferior medial genicular nerves  Needle size: 18g, 21mm active tip  Needle type: Cosman/[employer] RF Cannula  Findings:   -Comments: Biplanar orthogonal views demonstrated excellent placement of the cannulas along the genicular nerve paths.  Procedure Details: The fluoroscope was positioned in a 90 degree anteroposterior (AP) fluoroscopic view and then tilted to obtain a true AP view of the knee joint.  The target areas for the superior medial, superior lateral and inferior medial genicular nerves are the mid-point of the curve formed where the femoral shaft and epicondyles meet as well as the midpoint of the curve on the medial side where the tibial shaft meets the plateau.  Using fluoroscopic guidance the target areas were infiltrated using a 27-gauge  needle and 1-2 mL of 1% lidocaine without epinephrine to provide local anesthetic.  Then, for each target area a radiofrequency cannula was driven down under intermittent fluoroscopic guidance to the periosteum of the target site.  Once all 3 needles were in place in the AP view, a lateral view was obtained and each needle was driven  under control to the midpoint of the shafts respectively.  A spot film was obtained of the lateral view and saved.  Next, an AP view was obtained once again to confirm placement.  A spot film then was obtained and saved.  After fluoroscopic imaging was used to determine and confirm placement, motor stimulation was provided to confirm that there was no inadvertent motor stimulation.  Subsequently, each target area was infiltrated with 1-2 mL of the anesthetizing solution documented above.  After 2 minutes to allow for good anesthesia, a 90 second lesion was performed with the temperature maintained at [place].  This was repeated for each target area.   Additional Comments:  The patient tolerated the procedure well Dressing: band-aid    Post-procedure details: Patient was observed during the procedure. Post-procedure instructions were reviewed.  Patient left the clinic in stable condition.    Clinical History: No specialty comments available.     Objective:  VS:  HT:    WT:   BMI:     BP:119/74  HR:89bpm  TEMP: ( )  RESP:  Physical Exam Vitals and nursing note reviewed.  Constitutional:      General: She is not in acute distress.    Appearance: Normal appearance. She is not ill-appearing.  HENT:     Head: Normocephalic and atraumatic.     Right Ear: External ear normal.     Left Ear: External ear normal.  Eyes:     Extraocular Movements: Extraocular movements intact.  Cardiovascular:     Rate and Rhythm: Normal rate.     Pulses: Normal pulses.  Pulmonary:     Effort: Pulmonary effort is normal. No respiratory distress.  Abdominal:     General: There is no distension.     Palpations: Abdomen is soft.  Musculoskeletal:        General: Tenderness present.     Cervical back: Neck supple.     Right lower leg: No edema.     Left lower leg: No edema.     Comments: Patient has no mechanical knee findings on the left with good varus and valgus stability with joint line  tenderness.  Skin:    Findings: No erythema, lesion or rash.  Neurological:     General: No focal deficit present.     Mental Status: She is alert and oriented to person, place, and time.     Sensory: No sensory deficit.     Motor: No weakness or abnormal muscle tone.     Coordination: Coordination normal.     Gait: Gait abnormal.  Psychiatric:        Mood and Affect: Mood normal.        Behavior: Behavior normal.      Imaging: No results found.

## 2020-09-17 NOTE — Procedures (Signed)
Genicular Nerve Neurotomy with Fluoroscopic Guidance  Patient: Aimee Lawrence      Date of Birth: [birth year] MRN: [id number] PCP: [name], MD      Visit Date: 08/12/2020   Universal Protocol:    Date/Time: 03/09/226:01 AM  Consent Given By: the patient  Position: SUPINE  Additional Comments: Vital signs were monitored before and after the procedure. Patient was prepped and draped in the usual sterile fashion. The correct patient, procedure, and site was verified.  Injection Procedure Details:   Procedure diagnoses: Chronic pain of right knee [M25.561, G89.29]    Meds Administered:  Meds ordered this encounter  Medications  . methylPREDNISolone acetate (DEPO-MEDROL) injection 80 mg    Laterality: Right  Location/Site:  Knee: respective superior medial, superior lateral and inferior medial genicular nerves  Needle size: 18g, 27mm active tip  Needle type: Cosman/[employer] RF Cannula  Findings:   -Comments: Biplanar orthogonal views demonstrated excellent placement of the cannulas along the genicular nerve paths.  Procedure Details: The fluoroscope was positioned in a 90 degree anteroposterior (AP) fluoroscopic view and then tilted to obtain a true AP view of the knee joint.  The target areas for the superior medial, superior lateral and inferior medial genicular nerves are the mid-point of the curve formed where the femoral shaft and epicondyles meet as well as the midpoint of the curve on the medial side where the tibial shaft meets the plateau.  Using fluoroscopic guidance the target areas were infiltrated using a 27-gauge needle and 1-2 mL of 1% lidocaine without epinephrine to provide local anesthetic.  Then, for each target area a radiofrequency cannula was driven down under intermittent fluoroscopic guidance to the periosteum of the target site.  Once all 3 needles were in place in the AP view, a lateral view was obtained and each needle was driven  under control to the midpoint of the shafts respectively.  A spot film was obtained of the lateral view and saved.  Next, an AP view was obtained once again to confirm placement.  A spot film then was obtained and saved.  After fluoroscopic imaging was used to determine and confirm placement, motor stimulation was provided to confirm that there was no inadvertent motor stimulation.  Subsequently, each target area was infiltrated with 1-2 mL of the anesthetizing solution documented above.  After 2 minutes to allow for good anesthesia, a 90 second lesion was performed with the temperature maintained at [place].  This was repeated for each target area.   Additional Comments:  The patient tolerated the procedure well Dressing: band-aid    Post-procedure details: Patient was observed during the procedure. Post-procedure instructions were reviewed.  Patient left the clinic in stable condition.

## 2020-09-24 ENCOUNTER — Telehealth: Payer: Self-pay | Admitting: Family Medicine

## 2020-09-24 ENCOUNTER — Other Ambulatory Visit: Payer: Self-pay | Admitting: Family Medicine

## 2020-09-24 NOTE — Telephone Encounter (Signed)
Last refill- 06-25-2020 Last office visit- 03/26/2020  No future appointment scheduled

## 2020-09-24 NOTE — Telephone Encounter (Signed)
Pt call and stated she need a refill on  traMADol Veatrice Bourbon) 50 MG tablet sent to  [facility] [address], [place] Phone:  [phone]  Fax:  [phone]

## 2020-10-02 DIAGNOSIS — J3089 Other allergic rhinitis: Secondary | ICD-10-CM | POA: Diagnosis not present

## 2020-10-02 DIAGNOSIS — J3081 Allergic rhinitis due to animal (cat) (dog) hair and dander: Secondary | ICD-10-CM | POA: Diagnosis not present

## 2020-10-02 DIAGNOSIS — J301 Allergic rhinitis due to pollen: Secondary | ICD-10-CM | POA: Diagnosis not present

## 2020-10-15 DIAGNOSIS — J3089 Other allergic rhinitis: Secondary | ICD-10-CM | POA: Diagnosis not present

## 2020-10-15 DIAGNOSIS — J3081 Allergic rhinitis due to animal (cat) (dog) hair and dander: Secondary | ICD-10-CM | POA: Diagnosis not present

## 2020-10-15 DIAGNOSIS — J301 Allergic rhinitis due to pollen: Secondary | ICD-10-CM | POA: Diagnosis not present

## 2020-10-20 DIAGNOSIS — J301 Allergic rhinitis due to pollen: Secondary | ICD-10-CM | POA: Diagnosis not present

## 2020-10-20 DIAGNOSIS — J3089 Other allergic rhinitis: Secondary | ICD-10-CM | POA: Diagnosis not present

## 2020-10-20 DIAGNOSIS — J3081 Allergic rhinitis due to animal (cat) (dog) hair and dander: Secondary | ICD-10-CM | POA: Diagnosis not present

## 2020-11-21 DIAGNOSIS — J301 Allergic rhinitis due to pollen: Secondary | ICD-10-CM | POA: Diagnosis not present

## 2020-11-21 DIAGNOSIS — J3081 Allergic rhinitis due to animal (cat) (dog) hair and dander: Secondary | ICD-10-CM | POA: Diagnosis not present

## 2020-11-21 DIAGNOSIS — J3089 Other allergic rhinitis: Secondary | ICD-10-CM | POA: Diagnosis not present

## 2020-11-24 DIAGNOSIS — H2513 Age-related nuclear cataract, bilateral: Secondary | ICD-10-CM | POA: Diagnosis not present

## 2020-12-12 DIAGNOSIS — J301 Allergic rhinitis due to pollen: Secondary | ICD-10-CM | POA: Diagnosis not present

## 2020-12-12 DIAGNOSIS — J3081 Allergic rhinitis due to animal (cat) (dog) hair and dander: Secondary | ICD-10-CM | POA: Diagnosis not present

## 2020-12-12 DIAGNOSIS — J3089 Other allergic rhinitis: Secondary | ICD-10-CM | POA: Diagnosis not present

## 2020-12-13 ENCOUNTER — Other Ambulatory Visit: Payer: Self-pay | Admitting: Family Medicine

## 2020-12-15 ENCOUNTER — Telehealth: Payer: Self-pay | Admitting: Family Medicine

## 2020-12-15 MED ORDER — TRAMADOL HCL 50 MG PO TABS
100.0000 mg | ORAL_TABLET | Freq: Four times a day (QID) | ORAL | 2 refills | Status: DC | PRN
Start: 2020-12-15 — End: 2021-04-07

## 2020-12-15 NOTE — Telephone Encounter (Signed)
Last office visit- 03/26/20 Last refill-09/26/20-180 tabs,1 tab q8hrs, with 1 refill  No future office visit scheduled

## 2020-12-15 NOTE — Telephone Encounter (Signed)
Last refill-09/26/20,  180 tabs, 1 refill Last office visit-03/26/20  No future office visit scheduled

## 2020-12-15 NOTE — Telephone Encounter (Signed)
Pt call and stated she need a refill on her  traMADol Veatrice Bourbon) 50 MG tablet sent to  [facility] [address] [place] Phone:  [phone]  Fax:  [phone]

## 2020-12-15 NOTE — Telephone Encounter (Signed)
Done

## 2020-12-16 NOTE — Telephone Encounter (Signed)
LVM informing pt refill has been sent to [facility].

## 2020-12-18 ENCOUNTER — Telehealth: Payer: Self-pay | Admitting: Family Medicine

## 2020-12-18 NOTE — Telephone Encounter (Signed)
Left message for patient to call back and schedule Medicare Annual Wellness Visit (AWV) either virtually or in office.   Last AWV 06/11/16  please schedule at anytime with [facility] Nurse Health Advisor 1 or 2   This should be a 45 minute visit.

## 2021-01-20 ENCOUNTER — Telehealth: Payer: Self-pay | Admitting: Family Medicine

## 2021-01-20 NOTE — Telephone Encounter (Signed)
Left message for patient to call back and schedule Medicare Annual Wellness Visit (AWV) either virtually or in office.   Last AWV 06/11/16  please schedule at anytime with [facility] Nurse Health Advisor 1 or 2   This should be a 45 minute visit.

## 2021-01-28 NOTE — Telephone Encounter (Signed)
Scheduled 02/17/21 Documented on spreadsheet

## 2021-01-28 NOTE — Telephone Encounter (Signed)
Pt called the office to get scheduled

## 2021-02-17 ENCOUNTER — Other Ambulatory Visit: Payer: Self-pay

## 2021-02-17 ENCOUNTER — Ambulatory Visit (INDEPENDENT_AMBULATORY_CARE_PROVIDER_SITE_OTHER): Payer: Medicare Other

## 2021-02-17 VITALS — BP 130/80 | HR 92 | Temp 98.4°F

## 2021-02-17 DIAGNOSIS — Z86010 Personal history of colonic polyps: Secondary | ICD-10-CM

## 2021-02-17 DIAGNOSIS — Z1211 Encounter for screening for malignant neoplasm of colon: Secondary | ICD-10-CM

## 2021-02-17 DIAGNOSIS — Z0000 Encounter for general adult medical examination without abnormal findings: Secondary | ICD-10-CM | POA: Diagnosis not present

## 2021-02-17 DIAGNOSIS — Z1231 Encounter for screening mammogram for malignant neoplasm of breast: Secondary | ICD-10-CM

## 2021-02-17 NOTE — Progress Notes (Signed)
Subjective:   Aimee Lawrence is a 75 y.o. female who presents for Medicare Annual (Subsequent) preventive examination.  Review of Systems     Cardiac Risk Factors include: advanced age (>56mn, >>71women);hypertension     Objective:    Today's Vitals   02/17/21 1015 02/17/21 1016  BP:  130/80  Pulse:  92  Temp:  98.4 F (36.9 C)  SpO2:  96%  PainSc: 3     There is no height or weight on file to calculate BMI.  Advanced Directives 02/17/2021 09/13/2016 06/11/2016 06/04/2016 10/08/2015 06/23/2012  Does Patient Have a Medical Advance Directive? Yes Yes Yes Yes Yes Patient has advance directive, copy not in chart  Type of Advance Directive Living will [place] will - Living will [place] will -  Does patient want to make changes to medical advance directive? - - - No - Patient declined No - Patient declined -  Copy of [place] Chart? - No - copy requested - - No - copy requested -    Current Medications (verified) Outpatient Encounter Medications as of 02/17/2021  Medication Sig   acetaminophen (TYLENOL) 500 MG tablet Take 500 mg by mouth every 6 (six) hours as needed for moderate pain.   Calcium Carbonate-Vitamin D (CALCIUM 500/D PO) Take 2 tablets by mouth daily. 500 mg-200 mg   Cholecalciferol (VITAMIN D3) 2000 units capsule Take 4,000 Units by mouth daily.    FIBER SELECT GUMMIES PO Take 1 tablet by mouth daily.   fluticasone (FLONASE) 50 MCG/ACT nasal spray Place 1 spray into both nostrils daily.    montelukast (SINGULAIR) 10 MG tablet Take 1 tablet by mouth once daily or as directed.   Spacer/Aero-Holding Chambers (AEROCHAMBER PLUS) inhaler See admin instructions.   traMADol (ULTRAM) 50 MG tablet Take 2 tablets (100 mg total) by mouth every 6 (six) hours as needed.   albuterol (PROVENTIL HFA;VENTOLIN HFA) 108 (90 Base) MCG/ACT inhaler Inhale 2 puffs into the lungs every 6 (six) hours as needed for  wheezing or shortness of breath. (Patient not taking: Reported on 02/17/2021)   EPINEPHrine 0.3 mg/0.3 mL IJ SOAJ injection INJECT INTO THE MIDDLE OF THE OUTER THIGH AND HOLD FOR 3 SECONDS AS NEEDED FOR SEVERE ALLERGIC REACTION THEN CALL 911 IF USED.   No facility-administered encounter medications on file as of 02/17/2021.    Allergies (verified) Aleve [naproxen sodium], Aspirin, Ibuprofen, Ampicillin, and Erythromycin   History: Past Medical History:  Diagnosis Date   Allergy    takes Doxycycline daily and uses Flonase daily   Arthritis    Asthma    Albuterol inhaler as needed.   Dyspnea    rarely and sitting   GERD (gastroesophageal reflux disease)    not on any meds   History of bronchitis    History of shingles    Hx of colonic polyps    benign   Joint pain    Joint swelling    Osteopenia    takes Vit D3 and Calcium daily   Primary localized osteoarthritis of right knee    Past Surgical History:  Procedure Laterality Date   CHOLECYSTECTOMY  2007   lap choli   COLONOSCOPY  10/22/2015   per Dr. GCarlean Purl no polyps, repeat in 5 yrs    KNEE CLOSED REDUCTION Right 09/14/2016   Procedure: CLOSED MANIPULATION KNEE;  Surgeon: PMelrose Nakayama MD;  [facility]  Service: Orthopedics;  Laterality: Right;   NASAL POLYP  SURGERY  2007   SINUS ENDO W/FUSION  06/27/2012   Procedure: ENDOSCOPIC SINUS SURGERY WITH FUSION NAVIGATION;  Surgeon: Rozetta Nunnery, MD;  Location: [place];  Service: ENT;  Laterality: N/A;  FESS FUSION PROTOCOL   TOTAL KNEE ARTHROPLASTY Right 06/15/2016   Procedure: RIGHT TOTAL KNEE ARTHROPLASTY;  Surgeon: Melrose Nakayama, MD;  Location: [place];  Service: Orthopedics;  Laterality: Right;   Family History  Problem Relation Age of Onset   Alcohol abuse Other    Arthritis Other    Diabetes Other    Heart disease Other    Breast cancer Mother    Pneumonia Mother    Colon cancer Neg Hx    Stomach cancer Neg Hx    Social History    Socioeconomic History   Marital status: Single    Spouse name: Not on file   Number of children: Not on file   Years of education: Not on file   Highest education level: Not on file  Occupational History   Occupation: Retired Pharmacist, hospital  Tobacco Use   Smoking status: Never   Smokeless tobacco: Never  Substance and Sexual Activity   Alcohol use: No    Alcohol/week: 0.0 standard drinks    Comment: no etoh    Drug use: No   Sexual activity: Not on file  Other Topics Concern   Not on file  Social History Narrative   Retired   Single   Regular exercise- no         Social Determinants of Radio broadcast assistant Strain: Low Risk    Difficulty of Paying Living Expenses: Not hard at all  Food Insecurity: No Food Insecurity   Worried About Charity fundraiser in the Last Year: Never true   Arboriculturist in the Last Year: Never true  Transportation Needs: No Transportation Needs   Lack of Transportation (Medical): No   Lack of Transportation (Non-Medical): No  Physical Activity: Inactive   Days of Exercise per Week: 0 days   Minutes of Exercise per Session: 0 min  Stress: No Stress Concern Present   Feeling of Stress : Not at all  Social Connections: Moderately Isolated   Frequency of Communication with Friends and Family: More than three times a week   Frequency of Social Gatherings with Friends and Family: More than three times a week   Attends Religious Services: 1 to 4 times per year   Active Member of [employer] or Organizations: No   Attends Music therapist: Never   Marital Status: Never married    Tobacco Counseling Counseling given: Not Answered   Clinical Intake:  Pre-visit preparation completed: Yes  Pain : 0-10 Pain Score: 3  Pain Location: Knee Pain Orientation: Right, Left Pain Descriptors / Indicators: Dull Pain Frequency: Intermittent     BMI - recorded: 27.66 Nutritional Status: BMI 25 -29 Overweight Nutritional Risks:  None Diabetes: No  How often do you need to have someone help you when you read instructions, pamphlets, or other written materials from your doctor or pharmacy?: 1 - Never  Diabetic?No  Interpreter Needed?: No  Information entered by :: Charlott Rakes, LPN   Activities of Daily Living In your present state of health, do you have any difficulty performing the following activities: 02/17/2021  Hearing? Y  Comment HOH  Vision? N  Difficulty concentrating or making decisions? N  Walking or climbing stairs? Y  Dressing or bathing? Y  Comment use an  tool to help with putting socks and shoes on  Doing errands, shopping? N  Preparing Food and eating ? N  Using the Toilet? N  In the past six months, have you accidently leaked urine? N  Do you have problems with loss of bowel control? N  Managing your Medications? N  Managing your Finances? N  Housekeeping or managing your Housekeeping? N  Some recent data might be hidden    Patient Care Team: [name], MD as PCP - General [name], DO as Referring Physician (Sports Medicine)  Indicate any recent Medical Services you may have received from other than [facility] providers in the past year (date may be approximate).     Assessment:   This is a routine wellness examination for [facility].  Hearing/Vision screen Hearing Screening - Comments:: Pt is HOH  Vision Screening - Comments:: Pt follows up with [facility] but looking for a provider closer   Dietary issues and exercise activities discussed: Current Exercise Habits: The patient does not participate in regular exercise at present   Goals Addressed             This Visit's Progress    Patient Stated       Get hearing aids to hear better       Depression Screen PHQ 2/9 Scores 02/17/2021 07/10/2018 06/11/2016 06/03/2015 05/28/2014  PHQ - 2 Score 0 0 0 0 0    Fall Risk Fall Risk  02/17/2021 03/26/2020 07/10/2018 08/15/2017 08/15/2017  Falls in the past year? 0 0  0 No No  Number falls in past yr: 0 0 - - -  Injury with Fall? 0 0 - - -  Risk for fall due to : Impaired mobility No Fall Risks - - -  Follow up Falls prevention discussed - - - -    FALL RISK PREVENTION PERTAINING TO THE HOME:  Any stairs in or around the home? Yes  If so, are there any without handrails? No  Home free of loose throw rugs in walkways, pet beds, electrical cords, etc? Yes  Adequate lighting in your home to reduce risk of falls? Yes   ASSISTIVE DEVICES UTILIZED TO PREVENT FALLS:  Life alert? No  Use of a cane, walker or w/c? Yes  Grab bars in the bathroom? Yes  Shower chair or bench in shower? No  Elevated toilet seat or a handicapped toilet? No   TIMED UP AND GO:  Was the test performed? Yes .  Length of time to ambulate 10 feet: 15 sec.   Gait slow and steady without use of assistive device  Cognitive Function: MMSE - Mini Mental State Exam 06/11/2016  Not completed: (No Data)     6CIT Screen 02/17/2021  What Year? 0 points  What month? 0 points  What time? 0 points  Count back from 20 0 points  Months in reverse 2 points  Repeat phrase 0 points  Total Score 2    Immunizations Immunization History  Administered Date(s) Administered   Fluad Quad(high Dose 65+) 03/26/2020   Hepatitis A 03/06/2019   Influenza, High Dose Seasonal PF 04/15/2017, 09/08/2017, 04/13/2018, 05/31/2018, 05/31/2019, 06/02/2020   Influenza,inj,Quad PF,6+ Mos 03/28/2013, 05/28/2014   Influenza-Unspecified 05/11/2015, 05/12/2016, 03/29/2017, 03/06/2019   PFIZER(Purple Top)SARS-COV-2 Vaccination 08/02/2019, 08/23/2019, 06/02/2020   Pneumococcal Conjugate-13 06/30/2015   Pneumococcal Polysaccharide-23 07/13/2007, 09/08/2017, 06/02/2020   Pneumococcal-Unspecified 03/06/2019   Td 05/28/2010   Tdap 03/06/2019   Zoster Recombinat (Shingrix) 04/13/2018, 08/10/2018, 03/05/2019   Zoster,  Live 07/12/2004    TDAP status: Up to date  Flu Vaccine status: Due, Education has been  provided regarding the importance of this vaccine. Advised may receive this vaccine at local pharmacy or Health Dept. Aware to provide a copy of the vaccination record if obtained from local pharmacy or Health Dept. Verbalized acceptance and understanding.  Pneumococcal vaccine status: Up to date  Covid-19 vaccine status: Completed vaccines  Qualifies for Shingles Vaccine? Yes   Zostavax completed Yes   Shingrix Completed?: Yes  Screening Tests Health Maintenance  Topic Date Due   COVID-19 Vaccine (4 - Booster for Pfizer series) 09/30/2020   COLONOSCOPY (Pts 45-26yr Insurance coverage will need to be confirmed)  10/21/2020   INFLUENZA VACCINE  02/09/2021   TETANUS/TDAP  03/05/2029   DEXA SCAN  Completed   Hepatitis C Screening  Completed   PNA vac Low Risk Adult  Completed   Zoster Vaccines- Shingrix  Completed   HPV VACCINES  Aged Out    Health Maintenance  Health Maintenance Due  Topic Date Due   COVID-19 Vaccine (4 - Booster for PMicanopyseries) 09/30/2020   COLONOSCOPY (Pts 45-445yrInsurance coverage will need to be confirmed)  10/21/2020   INFLUENZA VACCINE  02/09/2021    Colorectal cancer screening: Referral to GI placed 02/17/21. Pt aware the office will call re: appt.  Mammogram status: Completed 09/07/18. Repeat every year  Bone Density status: Completed 02/13/15. Results reflect: Bone density results: OSTEOPENIA. Repeat every 2 years.   Additional Screening:  Hepatitis C Screening:  Completed 06/30/15  Vision Screening: Recommended annual ophthalmology exams for early detection of glaucoma and other disorders of the eye. Is the patient up to date with their annual eye exam?  Yes  Who is the provider or what is the name of the office in which the patient attends annual eye exams? Pt has to follow up with new provider  If pt is not established with a provider, would they like to be referred to a provider to establish care? No .   Dental Screening: Recommended  annual dental exams for proper oral hygiene  Community Resource Referral / Chronic Care Management: CRR required this visit?  No   CCM required this visit?  No      Plan:     I have personally reviewed and noted the following in the patient's chart:   Medical and social history Use of alcohol, tobacco or illicit drugs  Current medications and supplements including opioid prescriptions.  Functional ability and status Nutritional status Physical activity Advanced directives List of other physicians Hospitalizations, surgeries, and ER visits in previous 12 months Vitals Screenings to include cognitive, depression, and falls Referrals and appointments  In addition, I have reviewed and discussed with patient certain preventive protocols, quality metrics, and best practice recommendations. A written personalized care plan for preventive services as well as general preventive health recommendations were provided to patient.     TiWillette BraceLPN   [id number] Nurse Notes: None

## 2021-02-17 NOTE — Patient Instructions (Signed)
[name] , Thank you for taking time to come for your Medicare Wellness Visit. I appreciate your ongoing commitment to your health goals. Please review the following plan we discussed and let me know if I can assist you in the future.   Screening recommendations/referrals: Colonoscopy: order placed 02/17/21 Mammogram: Done 09/07/18 repeat every year Bone Density: Done 02/13/15 repeat every 2 years  Recommended yearly ophthalmology/optometry visit for glaucoma screening and checkup Recommended yearly dental visit for hygiene and checkup  Vaccinations: Influenza vaccine: Due  Pneumococcal vaccine: Completed  Tdap vaccine: Done 03/06/19 repeat every 10 years  Shingles vaccine: Completed 04/13/18, 08/10/18 & 03/05/19   Covid-19:Completed 1/21, 2/11, & 06/02/20  Advanced directives: Please bring a copy of your health care power of attorney and living will to the office at your convenience.  Conditions/risks identified: Work on getting hearing aids   Next appointment: Follow up in one year for your annual wellness visit    Preventive Care 11 Years and Older, Female Preventive care refers to lifestyle choices and visits with your health care provider that can promote health and wellness. What does preventive care include? A yearly physical exam. This is also called an annual well check. Dental exams once or twice a year. Routine eye exams. Ask your health care provider how often you should have your eyes checked. Personal lifestyle choices, including: Daily care of your teeth and gums. Regular physical activity. Eating a healthy diet. Avoiding tobacco and drug use. Limiting alcohol use. Practicing safe sex. Taking low-dose aspirin every day. Taking vitamin and mineral supplements as recommended by your health care provider. What happens during an annual well check? The services and screenings done by your health care provider during your annual well check will depend on your age, overall  health, lifestyle risk factors, and family history of disease. Counseling  Your health care provider may ask you questions about your: Alcohol use. Tobacco use. Drug use. Emotional well-being. Home and relationship well-being. Sexual activity. Eating habits. History of falls. Memory and ability to understand (cognition). Work and work Statistician. Reproductive health. Screening  You may have the following tests or measurements: Height, weight, and BMI. Blood pressure. Lipid and cholesterol levels. These may be checked every 5 years, or more frequently if you are over 71 years old. Skin check. Lung cancer screening. You may have this screening every year starting at age 44 if you have a 30-pack-year history of smoking and currently smoke or have quit within the past 15 years. Fecal occult blood test (FOBT) of the stool. You may have this test every year starting at age 32. Flexible sigmoidoscopy or colonoscopy. You may have a sigmoidoscopy every 5 years or a colonoscopy every 10 years starting at age 19. Hepatitis C blood test. Hepatitis B blood test. Sexually transmitted disease (STD) testing. Diabetes screening. This is done by checking your blood sugar (glucose) after you have not eaten for a while (fasting). You may have this done every 1-3 years. Bone density scan. This is done to screen for osteoporosis. You may have this done starting at age 85. Mammogram. This may be done every 1-2 years. Talk to your health care provider about how often you should have regular mammograms. Talk with your health care provider about your test results, treatment options, and if necessary, the need for more tests. Vaccines  Your health care provider may recommend certain vaccines, such as: Influenza vaccine. This is recommended every year. Tetanus, diphtheria, and acellular pertussis (Tdap, Td) vaccine. You  may need a Td booster every 10 years. Zoster vaccine. You may need this after age  30. Pneumococcal 13-valent conjugate (PCV13) vaccine. One dose is recommended after age 85. Pneumococcal polysaccharide (PPSV23) vaccine. One dose is recommended after age 79. Talk to your health care provider about which screenings and vaccines you need and how often you need them. This information is not intended to replace advice given to you by your health care provider. Make sure you discuss any questions you have with your health care provider. Document Released: 07/25/2015 Document Revised: 03/17/2016 Document Reviewed: 04/29/2015 Elsevier Interactive Patient Education  2017 [place] Prevention in the Home Falls can cause injuries. They can happen to people of all ages. There are many things you can do to make your home safe and to help prevent falls. What can I do on the outside of my home? Regularly fix the edges of walkways and driveways and fix any cracks. Remove anything that might make you trip as you walk through a door, such as a raised step or threshold. Trim any bushes or trees on the path to your home. Use bright outdoor lighting. Clear any walking paths of anything that might make someone trip, such as rocks or tools. Regularly check to see if handrails are loose or broken. Make sure that both sides of any steps have handrails. Any raised decks and porches should have guardrails on the edges. Have any leaves, snow, or ice cleared regularly. Use sand or salt on walking paths during winter. Clean up any spills in your garage right away. This includes oil or grease spills. What can I do in the bathroom? Use night lights. Install grab bars by the toilet and in the tub and shower. Do not use towel bars as grab bars. Use non-skid mats or decals in the tub or shower. If you need to sit down in the shower, use a plastic, non-slip stool. Keep the floor dry. Clean up any water that spills on the floor as soon as it happens. Remove soap buildup in the tub or shower  regularly. Attach bath mats securely with double-sided non-slip rug tape. Do not have throw rugs and other things on the floor that can make you trip. What can I do in the bedroom? Use night lights. Make sure that you have a light by your bed that is easy to reach. Do not use any sheets or blankets that are too big for your bed. They should not hang down onto the floor. Have a firm chair that has side arms. You can use this for support while you get dressed. Do not have throw rugs and other things on the floor that can make you trip. What can I do in the kitchen? Clean up any spills right away. Avoid walking on wet floors. Keep items that you use a lot in easy-to-reach places. If you need to reach something above you, use a strong step stool that has a grab bar. Keep electrical cords out of the way. Do not use floor polish or wax that makes floors slippery. If you must use wax, use non-skid floor wax. Do not have throw rugs and other things on the floor that can make you trip. What can I do with my stairs? Do not leave any items on the stairs. Make sure that there are handrails on both sides of the stairs and use them. Fix handrails that are broken or loose. Make sure that handrails are as long as the  stairways. Check any carpeting to make sure that it is firmly attached to the stairs. Fix any carpet that is loose or worn. Avoid having throw rugs at the top or bottom of the stairs. If you do have throw rugs, attach them to the floor with carpet tape. Make sure that you have a light switch at the top of the stairs and the bottom of the stairs. If you do not have them, ask someone to add them for you. What else can I do to help prevent falls? Wear shoes that: Do not have high heels. Have rubber bottoms. Are comfortable and fit you well. Are closed at the toe. Do not wear sandals. If you use a stepladder: Make sure that it is fully opened. Do not climb a closed stepladder. Make sure that  both sides of the stepladder are locked into place. Ask someone to hold it for you, if possible. Clearly mark and make sure that you can see: Any grab bars or handrails. First and last steps. Where the edge of each step is. Use tools that help you move around (mobility aids) if they are needed. These include: Canes. Walkers. Scooters. Crutches. Turn on the lights when you go into a dark area. Replace any light bulbs as soon as they burn out. Set up your furniture so you have a clear path. Avoid moving your furniture around. If any of your floors are uneven, fix them. If there are any pets around you, be aware of where they are. Review your medicines with your doctor. Some medicines can make you feel dizzy. This can increase your chance of falling. Ask your doctor what other things that you can do to help prevent falls. This information is not intended to replace advice given to you by your health care provider. Make sure you discuss any questions you have with your health care provider. Document Released: 04/24/2009 Document Revised: 12/04/2015 Document Reviewed: 08/02/2014 Elsevier Interactive Patient Education  2017 [employer].

## 2021-03-24 ENCOUNTER — Encounter: Payer: Self-pay | Admitting: Internal Medicine

## 2021-04-03 DIAGNOSIS — J3081 Allergic rhinitis due to animal (cat) (dog) hair and dander: Secondary | ICD-10-CM | POA: Diagnosis not present

## 2021-04-03 DIAGNOSIS — J301 Allergic rhinitis due to pollen: Secondary | ICD-10-CM | POA: Diagnosis not present

## 2021-04-03 DIAGNOSIS — J3089 Other allergic rhinitis: Secondary | ICD-10-CM | POA: Diagnosis not present

## 2021-04-04 ENCOUNTER — Other Ambulatory Visit: Payer: Self-pay | Admitting: Family Medicine

## 2021-04-06 NOTE — Telephone Encounter (Signed)
Last office visit- 03/26/20 Last refill-12/15/20--180 tabs, 2 refills- 2 tabs po q6hrs   No future OV scheduled,  is PMV required for refills?

## 2021-04-06 NOTE — Telephone Encounter (Signed)
PT called to request a refill of their traMADol (ULTRAM) 50 MG tablet called into the pharmacy on file due to they are out of the medication.

## 2021-04-10 DIAGNOSIS — J3081 Allergic rhinitis due to animal (cat) (dog) hair and dander: Secondary | ICD-10-CM | POA: Diagnosis not present

## 2021-04-10 DIAGNOSIS — J301 Allergic rhinitis due to pollen: Secondary | ICD-10-CM | POA: Diagnosis not present

## 2021-04-10 DIAGNOSIS — J3089 Other allergic rhinitis: Secondary | ICD-10-CM | POA: Diagnosis not present

## 2021-04-17 DIAGNOSIS — J3081 Allergic rhinitis due to animal (cat) (dog) hair and dander: Secondary | ICD-10-CM | POA: Diagnosis not present

## 2021-04-17 DIAGNOSIS — J301 Allergic rhinitis due to pollen: Secondary | ICD-10-CM | POA: Diagnosis not present

## 2021-04-17 DIAGNOSIS — J3089 Other allergic rhinitis: Secondary | ICD-10-CM | POA: Diagnosis not present

## 2021-05-01 DIAGNOSIS — J3081 Allergic rhinitis due to animal (cat) (dog) hair and dander: Secondary | ICD-10-CM | POA: Diagnosis not present

## 2021-05-01 DIAGNOSIS — J301 Allergic rhinitis due to pollen: Secondary | ICD-10-CM | POA: Diagnosis not present

## 2021-05-01 DIAGNOSIS — J3089 Other allergic rhinitis: Secondary | ICD-10-CM | POA: Diagnosis not present

## 2021-05-05 DIAGNOSIS — H25811 Combined forms of age-related cataract, right eye: Secondary | ICD-10-CM | POA: Diagnosis not present

## 2021-05-05 DIAGNOSIS — Z01818 Encounter for other preprocedural examination: Secondary | ICD-10-CM | POA: Diagnosis not present

## 2021-05-05 DIAGNOSIS — H25812 Combined forms of age-related cataract, left eye: Secondary | ICD-10-CM | POA: Diagnosis not present

## 2021-05-07 DIAGNOSIS — L304 Erythema intertrigo: Secondary | ICD-10-CM | POA: Diagnosis not present

## 2021-05-07 DIAGNOSIS — L821 Other seborrheic keratosis: Secondary | ICD-10-CM | POA: Diagnosis not present

## 2021-05-07 DIAGNOSIS — L814 Other melanin hyperpigmentation: Secondary | ICD-10-CM | POA: Diagnosis not present

## 2021-05-07 DIAGNOSIS — D225 Melanocytic nevi of trunk: Secondary | ICD-10-CM | POA: Diagnosis not present

## 2021-05-08 ENCOUNTER — Ambulatory Visit
Admission: RE | Admit: 2021-05-08 | Discharge: 2021-05-08 | Disposition: A | Discharge status: Home / Self Care | Payer: Medicare Other | Source: Ambulatory Visit | Attending: Family Medicine | Admitting: Family Medicine

## 2021-05-08 DIAGNOSIS — Z1231 Encounter for screening mammogram for malignant neoplasm of breast: Secondary | ICD-10-CM

## 2021-05-11 DIAGNOSIS — H25811 Combined forms of age-related cataract, right eye: Secondary | ICD-10-CM | POA: Diagnosis not present

## 2021-05-13 DIAGNOSIS — J301 Allergic rhinitis due to pollen: Secondary | ICD-10-CM | POA: Diagnosis not present

## 2021-05-13 DIAGNOSIS — J3089 Other allergic rhinitis: Secondary | ICD-10-CM | POA: Diagnosis not present

## 2021-05-13 DIAGNOSIS — J3081 Allergic rhinitis due to animal (cat) (dog) hair and dander: Secondary | ICD-10-CM | POA: Diagnosis not present

## 2021-05-15 ENCOUNTER — Encounter: Payer: Self-pay | Admitting: Physician Assistant

## 2021-05-15 ENCOUNTER — Ambulatory Visit: Payer: Medicare Other | Admitting: Physician Assistant

## 2021-05-15 VITALS — BP 118/74 | HR 84 | Ht 59.5 in | Wt 157.4 lb

## 2021-05-15 DIAGNOSIS — Z86010 Personal history of colonic polyps: Secondary | ICD-10-CM

## 2021-05-15 NOTE — Patient Instructions (Signed)
Follow up as needed.  If you are age 77 or older, your body mass index should be between 23-30. Your Body mass index is 31.25 kg/m. If this is out of the aforementioned range listed, please consider follow up with your Primary Care Provider.  If you are age 80 or younger, your body mass index should be between 19-25. Your Body mass index is 31.25 kg/m. If this is out of the aformentioned range listed, please consider follow up with your Primary Care Provider.   ________________________________________________________  The [place] [facility] providers would like to encourage you to use [facility] to communicate with providers for non-urgent requests or questions.  Due to long hold times on the telephone, sending your provider a message by [facility] may be a faster and more efficient way to get a response.  Please allow 48 business hours for a response.  Please remember that this is for non-urgent requests.  _______________________________________________________

## 2021-05-15 NOTE — Progress Notes (Signed)
Chief Complaint: History of adenomatous polyps  HPI:    Aimee Lawrence is a 75 year old female with a past medical history as listed below including reflux, known to Dr. Carlean Purl, history of adenomatous polyps.     07/20/2012 colonoscopy with 4 adenomatous polyps the largest 15 mm.  Repeat recommended in 3 years.    10/22/2015 colonoscopy for personal history of colonic polyp was completely normal.  Repeat recommended in 5 years for surveillance.    03/24/2021 patient was sent a letter by our office explaining that colonoscopy was now "not" recommended for screening.    Today, the patient presents to clinic and tells me that her primary care provider told her she needed another colonoscopy.  She is not having any GI issues at all.  Was unaware that our office sent her a letter with the new recommendations after changes in our surveillance program.  Overall she is doing well and does not want to have a colonoscopy if she does not need one, but is here if she does.    Denies fever, chills, weight loss, blood in her stool, change in bowel habits, abdominal pain, heartburn, reflux, nausea or vomiting.    Past Medical History:  Diagnosis Date   Allergy    takes Doxycycline daily and uses Flonase daily   Arthritis    Asthma    Albuterol inhaler as needed.   Dyspnea    rarely and sitting   GERD (gastroesophageal reflux disease)    not on any meds   History of bronchitis    History of shingles    Hx of colonic polyps    benign   Joint pain    Joint swelling    Osteopenia    takes Vit D3 and Calcium daily   Primary localized osteoarthritis of right knee     Past Surgical History:  Procedure Laterality Date   CHOLECYSTECTOMY  2007   lap choli   COLONOSCOPY  10/22/2015   per Dr. Carlean Purl, no polyps, repeat in 5 yrs    KNEE CLOSED REDUCTION Right 09/14/2016   Procedure: CLOSED MANIPULATION KNEE;  Surgeon: Melrose Nakayama, MD;  Location: [facility];  Service: Orthopedics;  Laterality: Right;   NASAL  POLYP SURGERY  2007   SINUS ENDO W/FUSION  06/27/2012   Procedure: ENDOSCOPIC SINUS SURGERY WITH FUSION NAVIGATION;  Surgeon: Rozetta Nunnery, MD;  Location: [facility];  Service: ENT;  Laterality: N/A;  FESS FUSION PROTOCOL   TOTAL KNEE ARTHROPLASTY Right 06/15/2016   Procedure: RIGHT TOTAL KNEE ARTHROPLASTY;  Surgeon: Melrose Nakayama, MD;  Location: [facility];  Service: Orthopedics;  Laterality: Right;    Current Outpatient Medications  Medication Sig Dispense Refill   traMADol (ULTRAM) 50 MG tablet TAKE TWO TABLETS BY MOUTH EVERY 6 HOURS AS NEEDED 180 tablet 5   acetaminophen (TYLENOL) 500 MG tablet Take 500 mg by mouth every 6 (six) hours as needed for moderate pain.     albuterol (PROVENTIL HFA;VENTOLIN HFA) 108 (90 Base) MCG/ACT inhaler Inhale 2 puffs into the lungs every 6 (six) hours as needed for wheezing or shortness of breath. (Patient not taking: Reported on 02/17/2021) 1 Inhaler 1   Calcium Carbonate-Vitamin D (CALCIUM 500/D PO) Take 2 tablets by mouth daily. 500 mg-200 mg     Cholecalciferol (VITAMIN D3) 2000 units capsule Take 4,000 Units by mouth daily.      EPINEPHrine 0.3 mg/0.3 mL IJ SOAJ injection INJECT INTO THE MIDDLE OF THE OUTER THIGH AND HOLD FOR 3  SECONDS AS NEEDED FOR SEVERE ALLERGIC REACTION THEN CALL 911 IF USED.     FIBER SELECT GUMMIES PO Take 1 tablet by mouth daily.     fluticasone (FLONASE) 50 MCG/ACT nasal spray Place 1 spray into both nostrils daily.      montelukast (SINGULAIR) 10 MG tablet Take 1 tablet by mouth once daily or as directed. 30 tablet 3   Spacer/Aero-Holding Chambers (AEROCHAMBER PLUS) inhaler See admin instructions.     No current facility-administered medications for this visit.    Allergies as of 05/15/2021 - Review Complete 02/17/2021  Allergen Reaction Noted   Aleve [naproxen sodium] Shortness Of Breath 05/29/2012   Aspirin Shortness Of Breath 06/29/2011   Ibuprofen Shortness Of Breath 05/29/2012   Ampicillin Rash  02/28/2007   Erythromycin Nausea And Vomiting 02/28/2007    Family History  Problem Relation Age of Onset   Alcohol abuse Other    Arthritis Other    Diabetes Other    Heart disease Other    Breast cancer Mother    Pneumonia Mother    Colon cancer Neg Hx    Stomach cancer Neg Hx     Social History   Socioeconomic History   Marital status: Single    Spouse name: Not on file   Number of children: Not on file   Years of education: Not on file   Highest education level: Not on file  Occupational History   Occupation: Retired Pharmacist, hospital  Tobacco Use   Smoking status: Never   Smokeless tobacco: Never  Substance and Sexual Activity   Alcohol use: No    Alcohol/week: 0.0 standard drinks    Comment: no etoh    Drug use: No   Sexual activity: Not on file  Other Topics Concern   Not on file  Social History Narrative   Retired   Single   Regular exercise- no         Social Determinants of Radio broadcast assistant Strain: Low Risk    Difficulty of Paying Living Expenses: Not hard at all  Food Insecurity: No Food Insecurity   Worried About Charity fundraiser in the Last Year: Never true   Arboriculturist in the Last Year: Never true  Transportation Needs: No Transportation Needs   Lack of Transportation (Medical): No   Lack of Transportation (Non-Medical): No  Physical Activity: Inactive   Days of Exercise per Week: 0 days   Minutes of Exercise per Session: 0 min  Stress: No Stress Concern Present   Feeling of Stress : Not at all  Social Connections: Moderately Isolated   Frequency of Communication with Friends and Family: More than three times a week   Frequency of Social Gatherings with Friends and Family: More than three times a week   Attends Religious Services: 1 to 4 times per year   Active Member of [employer] or Organizations: No   Attends Music therapist: Never   Marital Status: Never married  Human resources officer Violence: Not At Risk   Fear of  Current or Ex-Partner: No   Emotionally Abused: No   Physically Abused: No   Sexually Abused: No    Review of Systems:    Constitutional: No weight loss, fever or chills Skin: No rash  Cardiovascular: No chest pain  Respiratory: No SOB  Gastrointestinal: See HPI and otherwise negative Genitourinary: No dysuria Neurological: No headache, dizziness or syncope Musculoskeletal: No new muscle or joint pain Hematologic: No bleeding Psychiatric: No  history of depression or anxiety   Physical Exam:  Vital signs: BP 118/74 (BP Location: Left Arm, Patient Position: Sitting, Cuff Size: Normal)   Pulse 84   Ht 4' 11.5" (1.511 m) Comment: height measured without shoes  Wt 157 lb 6 oz (71.4 kg)   BMI 31.25 kg/m    Constitutional:   Pleasant Elderly Caucasian female appears to be in NAD, Well developed, Well nourished, alert and cooperative Head:  Normocephalic and atraumatic. Eyes:   PEERL, EOMI. No icterus. Conjunctiva pink. Ears:  Normal auditory acuity. Neck:  Supple Throat: Oral cavity and pharynx without inflammation, swelling or lesion.  Respiratory: Respirations even and unlabored. Lungs clear to auscultation bilaterally.   No wheezes, crackles, or rhonchi.  Cardiovascular: Normal S1, S2. No MRG. Regular rate and rhythm. No peripheral edema, cyanosis or pallor.  Gastrointestinal:  Soft, nondistended, nontender. No rebound or guarding. Normal bowel sounds. No appreciable masses or hepatomegaly. Rectal:  Not performed.  Msk:  Symmetrical without gross deformities. Without edema, no deformity or joint abnormality.  Neurologic:  Alert and  oriented x4;  grossly normal neurologically.  Skin:   Dry and intact without significant lesions or rashes. Psychiatric:  Demonstrates good judgement and reason without abnormal affect or behaviors.  RELEVANT LABS AND IMAGING: CBC    Component Value Date/Time   WBC 6.3 03/26/2020 1044   RBC 4.40 03/26/2020 1044   HGB 13.3 03/26/2020 1044   HCT  40.2 03/26/2020 1044   PLT 245 03/26/2020 1044   MCV 91.4 03/26/2020 1044   MCH 30.2 03/26/2020 1044   MCHC 33.1 03/26/2020 1044   RDW 12.6 03/26/2020 1044   LYMPHSABS 1,222 03/26/2020 1044   MONOABS 0.6 07/10/2018 0941   EOSABS 321 03/26/2020 1044   BASOSABS 82 03/26/2020 1044    CMP     Component Value Date/Time   NA 140 03/26/2020 1044   NA 141 06/22/2016 0000   K 4.0 03/26/2020 1044   CL 102 03/26/2020 1044   CO2 30 03/26/2020 1044   GLUCOSE 87 03/26/2020 1044   BUN 18 03/26/2020 1044   BUN 15 06/22/2016 0000   CREATININE 0.71 03/26/2020 1044   CALCIUM 9.2 03/26/2020 1044   PROT 6.6 03/26/2020 1044   ALBUMIN 4.2 07/10/2018 0941   AST 17 03/26/2020 1044   ALT 15 03/26/2020 1044   ALKPHOS 120 (H) 07/10/2018 0941   BILITOT 0.4 03/26/2020 1044   GFRNONAA >60 06/04/2016 0851   GFRAA >60 06/04/2016 0851    Assessment: 1.  History of adenomatous polyps: On first colonoscopy in 2014, none on repeat in 2017, now patient is 73 and recommendations are against surveillance at this point  Plan: 1.  Discussed with patient that she no longer needs a surveillance colonoscopy.  She is not having any GI symptoms at this time and is happy to not have one.  Discussed that if she develops any symptoms including an abrupt change in bowel habits, abdominal pain, blood in her stool or drastic weight loss without trying then she needs to call and let us know and at that point this may be recommended. 2.  Patient follow in clinic with [place] as needed.  Ellouise Newer, PA-C [facility] 05/15/2021, 8:31 AM  Cc: [name], MD

## 2021-05-22 DIAGNOSIS — J3089 Other allergic rhinitis: Secondary | ICD-10-CM | POA: Diagnosis not present

## 2021-05-22 DIAGNOSIS — J301 Allergic rhinitis due to pollen: Secondary | ICD-10-CM | POA: Diagnosis not present

## 2021-05-22 DIAGNOSIS — J3081 Allergic rhinitis due to animal (cat) (dog) hair and dander: Secondary | ICD-10-CM | POA: Diagnosis not present

## 2021-05-28 DIAGNOSIS — J301 Allergic rhinitis due to pollen: Secondary | ICD-10-CM | POA: Diagnosis not present

## 2021-05-28 DIAGNOSIS — J3081 Allergic rhinitis due to animal (cat) (dog) hair and dander: Secondary | ICD-10-CM | POA: Diagnosis not present

## 2021-05-28 DIAGNOSIS — J3089 Other allergic rhinitis: Secondary | ICD-10-CM | POA: Diagnosis not present

## 2021-06-01 DIAGNOSIS — H25812 Combined forms of age-related cataract, left eye: Secondary | ICD-10-CM | POA: Diagnosis not present

## 2021-06-03 DIAGNOSIS — J3089 Other allergic rhinitis: Secondary | ICD-10-CM | POA: Diagnosis not present

## 2021-06-03 DIAGNOSIS — J301 Allergic rhinitis due to pollen: Secondary | ICD-10-CM | POA: Diagnosis not present

## 2021-06-03 DIAGNOSIS — J3081 Allergic rhinitis due to animal (cat) (dog) hair and dander: Secondary | ICD-10-CM | POA: Diagnosis not present

## 2021-06-08 DIAGNOSIS — J3089 Other allergic rhinitis: Secondary | ICD-10-CM | POA: Diagnosis not present

## 2021-06-08 DIAGNOSIS — J3081 Allergic rhinitis due to animal (cat) (dog) hair and dander: Secondary | ICD-10-CM | POA: Diagnosis not present

## 2021-06-08 DIAGNOSIS — J4520 Mild intermittent asthma, uncomplicated: Secondary | ICD-10-CM | POA: Diagnosis not present

## 2021-06-08 DIAGNOSIS — J301 Allergic rhinitis due to pollen: Secondary | ICD-10-CM | POA: Diagnosis not present

## 2021-06-12 DIAGNOSIS — J3089 Other allergic rhinitis: Secondary | ICD-10-CM | POA: Diagnosis not present

## 2021-06-12 DIAGNOSIS — J3081 Allergic rhinitis due to animal (cat) (dog) hair and dander: Secondary | ICD-10-CM | POA: Diagnosis not present

## 2021-06-12 DIAGNOSIS — J301 Allergic rhinitis due to pollen: Secondary | ICD-10-CM | POA: Diagnosis not present

## 2021-06-17 DIAGNOSIS — J301 Allergic rhinitis due to pollen: Secondary | ICD-10-CM | POA: Diagnosis not present

## 2021-06-17 DIAGNOSIS — J3089 Other allergic rhinitis: Secondary | ICD-10-CM | POA: Diagnosis not present

## 2021-06-17 DIAGNOSIS — J3081 Allergic rhinitis due to animal (cat) (dog) hair and dander: Secondary | ICD-10-CM | POA: Diagnosis not present

## 2021-06-25 DIAGNOSIS — J301 Allergic rhinitis due to pollen: Secondary | ICD-10-CM | POA: Diagnosis not present

## 2021-06-25 DIAGNOSIS — J3081 Allergic rhinitis due to animal (cat) (dog) hair and dander: Secondary | ICD-10-CM | POA: Diagnosis not present

## 2021-06-25 DIAGNOSIS — J3089 Other allergic rhinitis: Secondary | ICD-10-CM | POA: Diagnosis not present

## 2021-06-30 DIAGNOSIS — J3089 Other allergic rhinitis: Secondary | ICD-10-CM | POA: Diagnosis not present

## 2021-06-30 DIAGNOSIS — J3081 Allergic rhinitis due to animal (cat) (dog) hair and dander: Secondary | ICD-10-CM | POA: Diagnosis not present

## 2021-06-30 DIAGNOSIS — J301 Allergic rhinitis due to pollen: Secondary | ICD-10-CM | POA: Diagnosis not present

## 2021-07-21 DIAGNOSIS — J3081 Allergic rhinitis due to animal (cat) (dog) hair and dander: Secondary | ICD-10-CM | POA: Diagnosis not present

## 2021-07-21 DIAGNOSIS — J301 Allergic rhinitis due to pollen: Secondary | ICD-10-CM | POA: Diagnosis not present

## 2021-07-21 DIAGNOSIS — J3089 Other allergic rhinitis: Secondary | ICD-10-CM | POA: Diagnosis not present

## 2021-07-23 DIAGNOSIS — J3089 Other allergic rhinitis: Secondary | ICD-10-CM | POA: Diagnosis not present

## 2021-07-23 DIAGNOSIS — J301 Allergic rhinitis due to pollen: Secondary | ICD-10-CM | POA: Diagnosis not present

## 2021-07-23 DIAGNOSIS — J3081 Allergic rhinitis due to animal (cat) (dog) hair and dander: Secondary | ICD-10-CM | POA: Diagnosis not present

## 2021-07-29 DIAGNOSIS — J3089 Other allergic rhinitis: Secondary | ICD-10-CM | POA: Diagnosis not present

## 2021-07-29 DIAGNOSIS — J301 Allergic rhinitis due to pollen: Secondary | ICD-10-CM | POA: Diagnosis not present

## 2021-07-29 DIAGNOSIS — J3081 Allergic rhinitis due to animal (cat) (dog) hair and dander: Secondary | ICD-10-CM | POA: Diagnosis not present

## 2021-08-10 DIAGNOSIS — J301 Allergic rhinitis due to pollen: Secondary | ICD-10-CM | POA: Diagnosis not present

## 2021-08-10 DIAGNOSIS — J3089 Other allergic rhinitis: Secondary | ICD-10-CM | POA: Diagnosis not present

## 2021-08-10 DIAGNOSIS — J3081 Allergic rhinitis due to animal (cat) (dog) hair and dander: Secondary | ICD-10-CM | POA: Diagnosis not present

## 2021-08-11 DIAGNOSIS — J301 Allergic rhinitis due to pollen: Secondary | ICD-10-CM | POA: Diagnosis not present

## 2021-08-11 DIAGNOSIS — J3089 Other allergic rhinitis: Secondary | ICD-10-CM | POA: Diagnosis not present

## 2021-08-11 DIAGNOSIS — J3081 Allergic rhinitis due to animal (cat) (dog) hair and dander: Secondary | ICD-10-CM | POA: Diagnosis not present

## 2021-08-17 DIAGNOSIS — J301 Allergic rhinitis due to pollen: Secondary | ICD-10-CM | POA: Diagnosis not present

## 2021-08-17 DIAGNOSIS — J3089 Other allergic rhinitis: Secondary | ICD-10-CM | POA: Diagnosis not present

## 2021-08-19 DIAGNOSIS — J301 Allergic rhinitis due to pollen: Secondary | ICD-10-CM | POA: Diagnosis not present

## 2021-08-19 DIAGNOSIS — J3081 Allergic rhinitis due to animal (cat) (dog) hair and dander: Secondary | ICD-10-CM | POA: Diagnosis not present

## 2021-08-19 DIAGNOSIS — J3089 Other allergic rhinitis: Secondary | ICD-10-CM | POA: Diagnosis not present

## 2021-08-21 ENCOUNTER — Ambulatory Visit (INDEPENDENT_AMBULATORY_CARE_PROVIDER_SITE_OTHER): Payer: Medicare HMO | Admitting: Family Medicine

## 2021-08-21 ENCOUNTER — Encounter: Payer: Self-pay | Admitting: Family Medicine

## 2021-08-21 VITALS — BP 110/70 | HR 78 | Temp 98.3°F | Ht 61.0 in | Wt 160.0 lb

## 2021-08-21 DIAGNOSIS — M25511 Pain in right shoulder: Secondary | ICD-10-CM | POA: Diagnosis not present

## 2021-08-21 DIAGNOSIS — Z0000 Encounter for general adult medical examination without abnormal findings: Secondary | ICD-10-CM | POA: Diagnosis not present

## 2021-08-21 DIAGNOSIS — J3089 Other allergic rhinitis: Secondary | ICD-10-CM | POA: Diagnosis not present

## 2021-08-21 DIAGNOSIS — J301 Allergic rhinitis due to pollen: Secondary | ICD-10-CM | POA: Diagnosis not present

## 2021-08-21 DIAGNOSIS — M25562 Pain in left knee: Secondary | ICD-10-CM

## 2021-08-21 DIAGNOSIS — J3081 Allergic rhinitis due to animal (cat) (dog) hair and dander: Secondary | ICD-10-CM | POA: Diagnosis not present

## 2021-08-21 DIAGNOSIS — G8929 Other chronic pain: Secondary | ICD-10-CM | POA: Diagnosis not present

## 2021-08-21 LAB — LIPID PANEL
Cholesterol: 199 mg/dL (ref 0–200)
HDL: 64.4 mg/dL
LDL Cholesterol: 124 mg/dL — ABNORMAL HIGH (ref 0–99)
NonHDL: 134.79
Total CHOL/HDL Ratio: 3
Triglycerides: 55 mg/dL (ref 0.0–149.0)
VLDL: 11 mg/dL (ref 0.0–40.0)

## 2021-08-21 LAB — CBC WITH DIFFERENTIAL/PLATELET
Basophils Absolute: 0.2 K/uL — ABNORMAL HIGH (ref 0.0–0.1)
Basophils Relative: 4.3 % — ABNORMAL HIGH (ref 0.0–3.0)
Eosinophils Absolute: 0.4 K/uL (ref 0.0–0.7)
Eosinophils Relative: 9.1 % — ABNORMAL HIGH (ref 0.0–5.0)
HCT: 40.1 % (ref 36.0–46.0)
Hemoglobin: 13.1 g/dL (ref 12.0–15.0)
Lymphocytes Relative: 33.6 % (ref 12.0–46.0)
Lymphs Abs: 1.6 K/uL (ref 0.7–4.0)
MCHC: 32.8 g/dL (ref 30.0–36.0)
MCV: 89.8 fl (ref 78.0–100.0)
Monocytes Absolute: 0.5 K/uL (ref 0.1–1.0)
Monocytes Relative: 11.1 % (ref 3.0–12.0)
Neutro Abs: 2 K/uL (ref 1.4–7.7)
Neutrophils Relative %: 41.9 % — ABNORMAL LOW (ref 43.0–77.0)
Platelets: 251 K/uL (ref 150.0–400.0)
RBC: 4.47 Mil/uL (ref 3.87–5.11)
RDW: 14.2 % (ref 11.5–15.5)
WBC: 4.8 K/uL (ref 4.0–10.5)

## 2021-08-21 LAB — BASIC METABOLIC PANEL WITH GFR
BUN: 18 mg/dL (ref 6–23)
CO2: 35 meq/L — ABNORMAL HIGH (ref 19–32)
Calcium: 9.6 mg/dL (ref 8.4–10.5)
Chloride: 102 meq/L (ref 96–112)
Creatinine, Ser: 0.87 mg/dL (ref 0.40–1.20)
GFR: 64.09 mL/min
Glucose, Bld: 76 mg/dL (ref 70–99)
Potassium: 4.5 meq/L (ref 3.5–5.1)
Sodium: 141 meq/L (ref 135–145)

## 2021-08-21 LAB — TSH: TSH: 2.08 u[IU]/mL (ref 0.35–5.50)

## 2021-08-21 LAB — HEPATIC FUNCTION PANEL
ALT: 10 U/L (ref 0–35)
AST: 17 U/L (ref 0–37)
Albumin: 3.8 g/dL (ref 3.5–5.2)
Alkaline Phosphatase: 90 U/L (ref 39–117)
Bilirubin, Direct: 0 mg/dL (ref 0.0–0.3)
Total Bilirubin: 0.3 mg/dL (ref 0.2–1.2)
Total Protein: 7.2 g/dL (ref 6.0–8.3)

## 2021-08-21 LAB — HEMOGLOBIN A1C: Hgb A1c MFr Bld: 6.1 % (ref 4.6–6.5)

## 2021-08-21 MED ORDER — MONTELUKAST SODIUM 10 MG PO TABS: ORAL_TABLET | ORAL | 3 refills | Status: AC

## 2021-08-21 MED ORDER — AEROCHAMBER PLUS MISC: 1.0000 | Freq: Once | 2 refills | Status: AC

## 2021-08-21 MED ORDER — TRAMADOL HCL 50 MG PO TABS: ORAL_TABLET | ORAL | 5 refills | Status: DC

## 2021-08-21 NOTE — Progress Notes (Signed)
Subjective:    Patient ID: Aimee Lawrence, female    DOB: 08-15-1945, 76 y.o.   MRN: [id number]  HPI Here for a well exam. She feels fine except for her OA. Lately the main joints affected are the left knee and the right shoulder. She takes Tylenol as needed. She has seen [name] in the past for cortisone shots, and she wants to see him again. She spoke to Dr. Celesta Aver office about a colonoscopy last year but she was told that she no longer needs these because her age is over 38.    Review of Systems  Constitutional: Negative.   HENT: Negative.    Eyes: Negative.   Respiratory: Negative.    Cardiovascular: Negative.   Gastrointestinal: Negative.   Genitourinary:  Negative for decreased urine volume, difficulty urinating, dyspareunia, dysuria, enuresis, flank pain, frequency, hematuria, pelvic pain and urgency.  Musculoskeletal:  Positive for arthralgias.  Skin: Negative.   Neurological: Negative.  Negative for headaches.  Psychiatric/Behavioral: Negative.        Objective:   Physical Exam Constitutional:      General: She is not in acute distress.    Appearance: Normal appearance. She is well-developed.     Comments: Walks with a cane   HENT:     Head: Normocephalic and atraumatic.     Right Ear: External ear normal.     Left Ear: External ear normal.     Nose: Nose normal.     Mouth/Throat:     Pharynx: No oropharyngeal exudate.  Eyes:     General: No scleral icterus.    Conjunctiva/sclera: Conjunctivae normal.     Pupils: Pupils are equal, round, and reactive to light.  Neck:     Thyroid: No thyromegaly.     Vascular: No JVD.  Cardiovascular:     Rate and Rhythm: Normal rate and regular rhythm.     Heart sounds: Normal heart sounds. No murmur heard.   No friction rub. No gallop.  Pulmonary:     Effort: Pulmonary effort is normal. No respiratory distress.     Breath sounds: Normal breath sounds. No wheezing or rales.  Chest:     Chest wall: No tenderness.   Abdominal:     General: Bowel sounds are normal. There is no distension.     Palpations: Abdomen is soft. There is no mass.     Tenderness: There is no abdominal tenderness. There is no guarding or rebound.  Musculoskeletal:        General: No tenderness. Normal range of motion.     Cervical back: Normal range of motion and neck supple.  Lymphadenopathy:     Cervical: No cervical adenopathy.  Skin:    General: Skin is warm and dry.     Findings: No erythema or rash.  Neurological:     General: No focal deficit present.     Mental Status: She is alert and oriented to person, place, and time.     Cranial Nerves: No cranial nerve deficit.     Motor: No abnormal muscle tone.     Coordination: Coordination normal.     Deep Tendon Reflexes: Reflexes are normal and symmetric. Reflexes normal.  Psychiatric:        Behavior: Behavior normal.        Thought Content: Thought content normal.        Judgment: Judgment normal.          Assessment & Plan:  Well exam. We discussed  diet and exercise. Get fasting labs. Refer to Sports Medicine for the joint pains. Alysia Penna, MD

## 2021-08-27 DIAGNOSIS — J3081 Allergic rhinitis due to animal (cat) (dog) hair and dander: Secondary | ICD-10-CM | POA: Diagnosis not present

## 2021-08-27 DIAGNOSIS — J3089 Other allergic rhinitis: Secondary | ICD-10-CM | POA: Diagnosis not present

## 2021-08-27 DIAGNOSIS — J301 Allergic rhinitis due to pollen: Secondary | ICD-10-CM | POA: Diagnosis not present

## 2021-09-03 DIAGNOSIS — J3081 Allergic rhinitis due to animal (cat) (dog) hair and dander: Secondary | ICD-10-CM | POA: Diagnosis not present

## 2021-09-03 DIAGNOSIS — J301 Allergic rhinitis due to pollen: Secondary | ICD-10-CM | POA: Diagnosis not present

## 2021-09-03 DIAGNOSIS — J3089 Other allergic rhinitis: Secondary | ICD-10-CM | POA: Diagnosis not present

## 2021-09-11 DIAGNOSIS — J3081 Allergic rhinitis due to animal (cat) (dog) hair and dander: Secondary | ICD-10-CM | POA: Diagnosis not present

## 2021-09-11 DIAGNOSIS — J3089 Other allergic rhinitis: Secondary | ICD-10-CM | POA: Diagnosis not present

## 2021-09-11 DIAGNOSIS — J301 Allergic rhinitis due to pollen: Secondary | ICD-10-CM | POA: Diagnosis not present

## 2021-09-18 DIAGNOSIS — J3081 Allergic rhinitis due to animal (cat) (dog) hair and dander: Secondary | ICD-10-CM | POA: Diagnosis not present

## 2021-09-18 DIAGNOSIS — J301 Allergic rhinitis due to pollen: Secondary | ICD-10-CM | POA: Diagnosis not present

## 2021-09-18 DIAGNOSIS — J3089 Other allergic rhinitis: Secondary | ICD-10-CM | POA: Diagnosis not present

## 2021-09-22 DIAGNOSIS — M25561 Pain in right knee: Secondary | ICD-10-CM | POA: Diagnosis not present

## 2021-09-22 DIAGNOSIS — M25562 Pain in left knee: Secondary | ICD-10-CM | POA: Diagnosis not present

## 2021-09-22 DIAGNOSIS — M170 Bilateral primary osteoarthritis of knee: Secondary | ICD-10-CM | POA: Diagnosis not present

## 2021-09-24 DIAGNOSIS — H903 Sensorineural hearing loss, bilateral: Secondary | ICD-10-CM | POA: Diagnosis not present

## 2021-10-05 DIAGNOSIS — J3081 Allergic rhinitis due to animal (cat) (dog) hair and dander: Secondary | ICD-10-CM | POA: Diagnosis not present

## 2021-10-05 DIAGNOSIS — J301 Allergic rhinitis due to pollen: Secondary | ICD-10-CM | POA: Diagnosis not present

## 2021-10-05 DIAGNOSIS — J3089 Other allergic rhinitis: Secondary | ICD-10-CM | POA: Diagnosis not present

## 2021-10-20 DIAGNOSIS — M9906 Segmental and somatic dysfunction of lower extremity: Secondary | ICD-10-CM | POA: Diagnosis not present

## 2021-10-20 DIAGNOSIS — M25561 Pain in right knee: Secondary | ICD-10-CM | POA: Diagnosis not present

## 2021-10-20 DIAGNOSIS — M170 Bilateral primary osteoarthritis of knee: Secondary | ICD-10-CM | POA: Diagnosis not present

## 2021-10-26 ENCOUNTER — Encounter: Payer: Self-pay | Admitting: Physical Therapy

## 2021-10-26 ENCOUNTER — Ambulatory Visit: Payer: Medicare HMO | Admitting: Physical Therapy

## 2021-10-26 DIAGNOSIS — M6281 Muscle weakness (generalized): Secondary | ICD-10-CM | POA: Diagnosis not present

## 2021-10-26 DIAGNOSIS — J301 Allergic rhinitis due to pollen: Secondary | ICD-10-CM | POA: Diagnosis not present

## 2021-10-26 DIAGNOSIS — G8929 Other chronic pain: Secondary | ICD-10-CM

## 2021-10-26 DIAGNOSIS — M25562 Pain in left knee: Secondary | ICD-10-CM

## 2021-10-26 DIAGNOSIS — M25511 Pain in right shoulder: Secondary | ICD-10-CM | POA: Diagnosis not present

## 2021-10-26 DIAGNOSIS — M25561 Pain in right knee: Secondary | ICD-10-CM | POA: Diagnosis not present

## 2021-10-26 DIAGNOSIS — J3089 Other allergic rhinitis: Secondary | ICD-10-CM | POA: Diagnosis not present

## 2021-10-26 DIAGNOSIS — J3081 Allergic rhinitis due to animal (cat) (dog) hair and dander: Secondary | ICD-10-CM | POA: Diagnosis not present

## 2021-10-26 NOTE — Therapy (Signed)
?OUTPATIENT PHYSICAL THERAPY TREATMENT NOTE ? ? ?Patient Name: Aimee Lawrence ?MRN: [id number] ?DOB:[birth year], 76 y.o., female ?Today's Date: 10/29/2021 ? ?PCP: [name], MD ?REFERRING PROVIDER: [name], MD ? ?END OF SESSION:  ? PT End of Session - 10/29/21 1433   ? ? Visit Number 2   ? Number of Visits 12   ? Date for PT Re-Evaluation 12/07/21   ? Authorization Type humana -auth approved 12 visists from 10/26/21 to 12/09/21   ? Progress Note Due on Visit 10   ? PT Start Time 1430   ? PT Stop Time 1510   ? PT Time Calculation (min) 40 min   ? Activity Tolerance Patient tolerated treatment well   ? Behavior During Therapy [facility] for tasks assessed/performed   ? ?  ?  ? ?  ? ? ?Past Medical History:  ?Diagnosis Date  ? Allergy   ? takes Doxycycline daily and uses Flonase daily  ? Arthritis   ? Asthma   ? Albuterol inhaler as needed.  ? Dyspnea   ? rarely and sitting  ? GERD (gastroesophageal reflux disease)   ? not on any meds  ? History of bronchitis   ? History of shingles   ? Hx of colonic polyps   ? benign  ? Joint pain   ? Joint swelling   ? Osteopenia   ? takes Vit D3 and Calcium daily  ? Primary localized osteoarthritis of right knee   ? ?Past Surgical History:  ?Procedure Laterality Date  ? APPENDECTOMY    ? CHOLECYSTECTOMY  2007  ? lap choli  ? COLONOSCOPY  10/22/2015  ? per Dr. Carlean Purl, no polyps, repeat in 5 yrs   ? KNEE CLOSED REDUCTION Right 09/14/2016  ? Procedure: CLOSED MANIPULATION KNEE;  Surgeon: Melrose Nakayama, MD;  Location: [place];  Service: Orthopedics;  Laterality: Right;  ? NASAL POLYP SURGERY  2007  ? SINUS ENDO W/FUSION  06/27/2012  ? Procedure: ENDOSCOPIC SINUS SURGERY WITH FUSION NAVIGATION;  Surgeon: Rozetta Nunnery, MD;  Location: [place];  Service: ENT;  Laterality: N/A;  FESS FUSION PROTOCOL  ? TOTAL KNEE ARTHROPLASTY Right 06/15/2016  ? Procedure: RIGHT TOTAL KNEE ARTHROPLASTY;  Surgeon: Melrose Nakayama, MD;  Location: [place];  Service: Orthopedics;   Laterality: Right;  ? ?Patient Active Problem List  ? Diagnosis Date Noted  ? Abnormal finding on imaging 08/29/2017  ? Right hip pain 08/15/2017  ? S/P TKR (total knee replacement), right 08/11/2017  ? Osteoarthritis of hip 08/11/2017  ? Primary localized osteoarthritis of right knee 06/15/2016  ? Primary osteoarthritis of right knee 06/15/2016  ? Nasal polyps 11/20/2012  ? Essential hypertension 05/28/2010  ? DEPRESSION 02/28/2007  ? Asthma 02/28/2007  ? Osteopenia 02/28/2007  ? Personal hx colon polyps 02/28/2007  ? ? ?PCP: [name], MD ?  ?REFERRING PROVIDER: [name], DO ?  ?REFERRING DIAG: M25.561 (ICD-10-CM) - Pain in right knee ?M25.562 (ICD-10-CM) - Pain in left knee ?  ?THERAPY DIAG:  ?Chronic pain of left knee ?  ?Muscle weakness (generalized) ?  ?Chronic pain of right knee ?  ?Chronic right shoulder pain ?  ?ONSET DATE:  ?M25.561 (ICD-10-CM) - Pain in right knee ?M25.562 (ICD-10-CM) - Pain in left knee ?  ?SUBJECTIVE:  ?  ?SUBJECTIVE STATEMENT: ?10/29/2021 ?States she was doing her exercises out in the waiting room and is sore in her legs but otherwise is alright ? ?Eval: States she would like to be  able to move her right knee better and she has left knee pain. States that she has to crawl up and down her stairs and she has been gardening but she does use a grabber sometimes. States that she tries to bend her knees while sitting in her recliner. States she was going to therapy after her manipulation but it got expensive. States that she had some minor relief with her recent knee ablation last year but the pain came back. Goes to [employer] and wants to get back to the pool. Patient wants to avoid surgery on her left knee as she did not do well with her right knee surgery. ?  ?PERTINENT HISTORY: ?osteopenia, history of bronchitis and asthma, 08/12/20 right knee ablation, multiple knee injections, history of tka R 12/17 with manipulation on 3/18, hard of hearing, ?  ?PAIN:  ?Are you having pain?  Yes: NPRS scale: 2/10 ?Pain location: in both knees  ?Pain description: achy ?Aggravating factors: standing, stairs, bending,  ?Relieving factors: rest ?  ?PRECAUTIONS: Fall ?  ?WEIGHT BEARING RESTRICTIONS No ?  ?FALLS:  ?Has patient fallen in last 6 months? Yes. Number of falls 1  walking in garden and leaning forward ?  ?LIVING ENVIRONMENT: ?Lives with: lives alone and nearby friend ?Lives in: House/apartment ?Stairs: Yes: Internal: 10 steps; on left going up and External: 4 steps; on right going up states she doesn't have to go up the stairs in the house ?Has following equipment at home: Single point cane uses occasionally, also has quad cane, grabber and sock donner ?  ?OCCUPATION: not currently working ?  ?  ?PLOF: Independent ?  ?PATIENT GOALS to move your right knee more and have less pain in left knee ?  ?  ?OBJECTIVE:  ?  ?DIAGNOSTIC FINDINGS: no recent imaging ?  ?  ?COGNITION: ?          Overall cognitive status: Within functional limits for tasks assessed               ?           ?SENSATION: ?WFL ?  ?  ?POSTURE:  ?Slumped posture, limited hip and knee extension ?  ?PALPATION: ?Tenderness to palpation along right knee muscles ?  ?  ?  ?           LE Measurements ?      ?Lower Extremity Right ?10/26/2021 Left ?10/26/2021  ?  A/PROM MMT A/PROM MMT  ?Hip Flexion   4+   4  ?Hip Extension          ?Hip Abduction          ?Hip Adduction          ?Hip Internal rotation          ?Hip External rotation          ?Knee Flexion 74* 3+ 122* 4  ?Knee Extension Lacking 10 4* Lacking 10* 4  ?Ankle Dorsiflexion          ?Ankle Plantarflexion          ?Ankle Inversion          ?Ankle Eversion          ? (Blank rows = not tested) ?           * pain ?  ?  ?  ?  ?FUNCTIONAL TESTS:  ?STS: kicks out right leg, uncontrolled descent, uses UE to assist ?Stairs 6" height - step to with unilateral railing going up, step  to with bilateral railing coming down ?  ?GAIT: ?Distance walked: 25 feet ?Assistive device utilized: Single point  cane ?Level of assistance: SBA ?Comments: limited hip and knee motion, bent forward,  ?  ?  ?  ?TODAY'S TREATMENT: ?10/29/2021 ?Therapeutic Exercise: ?   Aerobic: rocking back and forth on bike 4 minutes  ?Supine: heel slide hold x3 60" holds, with strap,  ?Prone: ?   Seated: LAQ 2x10 5" holds R, AAROM R knee flexion 5 minutes total  ?   Standing: ?Neuromuscular Re-education: ?Manual Therapy: Contract/relax into max knee flexion 5 minutes ?Therapeutic Activity: STS from elevated surface - 15 minutes cues to bend knees, slow descent tactile support 22.5 inches ? ?  ?  ?PATIENT EDUCATION:  ?Education details: on  HEP, on DN, on ice and current presentation ?Person educated: Patient ?Education method: Explanation, Demonstration, and Handouts ?Education comprehension: verbalized understanding ?  ?  ?  ?HOME EXERCISE PROGRAM: ?[id number] ?  ?ASSESSMENT: ?  ?CLINICAL IMPRESSION: ?10/29/2021 ?Session focused on functional mobility and knee ROM. This was tolerated well. Will measure motion next session. Patient inquired about DN and ice, educated about both modalities and current presentation. Will continue with current POC.  ? ?Eval: Patient is a 76 y.o. female who was seen today for physical therapy evaluation and treatment for bilateral knee pain, and right knee ROM limitations. Patient presents with extensive right knee history with history of R TKA in 2017, right knee manipulation in 2018 and right knee nerve ablation in 2022. Given right knee history, patient's overall outcomes are guarded.Discussed extensively the work required to anticipate change and goal being to achieve about the motion she had around the time of her manipulation (last documented knee flexion measurement at 95 degrees). Patient would benefit from skilled PT to improve overall right knee function and reduce pain in left knee to avoid need for additional surgeries on left knee.  ?  ?  ?OBJECTIVE IMPAIRMENTS Abnormal gait, decreased activity tolerance,  decreased balance, decreased knowledge of use of DME, decreased mobility, difficulty walking, decreased ROM, decreased strength, increased edema, and pain.  ?  ?ACTIVITY LIMITATIONS cleaning, community act

## 2021-10-26 NOTE — Therapy (Signed)
?OUTPATIENT PHYSICAL THERAPY LOWER EXTREMITY EVALUATION ? ? ?Patient Name: Aimee Lawrence ?MRN: [id number] ?DOB:1945-12-15, 76 y.o., female ?Today's Date: 10/26/2021 ? ? PT End of Session - 10/26/21 1211   ? ? Visit Number 1   ? Number of Visits 12   ? Date for PT Re-Evaluation 12/07/21   ? Authorization Type humana -auth requested   ? Progress Note Due on Visit 10   ? PT Start Time 1215   ? PT Stop Time 6295   ? PT Time Calculation (min) 34 min   ? Activity Tolerance Patient tolerated treatment well   ? Behavior During Therapy [facility] for tasks assessed/performed   ? ?  ?  ? ?  ? ? ?Past Medical History:  ?Diagnosis Date  ? Allergy   ? takes Doxycycline daily and uses Flonase daily  ? Arthritis   ? Asthma   ? Albuterol inhaler as needed.  ? Dyspnea   ? rarely and sitting  ? GERD (gastroesophageal reflux disease)   ? not on any meds  ? History of bronchitis   ? History of shingles   ? Hx of colonic polyps   ? benign  ? Joint pain   ? Joint swelling   ? Osteopenia   ? takes Vit D3 and Calcium daily  ? Primary localized osteoarthritis of right knee   ? ?Past Surgical History:  ?Procedure Laterality Date  ? APPENDECTOMY    ? CHOLECYSTECTOMY  2007  ? lap choli  ? COLONOSCOPY  10/22/2015  ? per Dr. Carlean Purl, no polyps, repeat in 5 yrs   ? KNEE CLOSED REDUCTION Right 09/14/2016  ? Procedure: CLOSED MANIPULATION KNEE;  Surgeon: Melrose Nakayama, MD;  Location: [place];  Service: Orthopedics;  Laterality: Right;  ? NASAL POLYP SURGERY  2007  ? SINUS ENDO W/FUSION  06/27/2012  ? Procedure: ENDOSCOPIC SINUS SURGERY WITH FUSION NAVIGATION;  Surgeon: Rozetta Nunnery, MD;  Location: [place];  Service: ENT;  Laterality: N/A;  FESS FUSION PROTOCOL  ? TOTAL KNEE ARTHROPLASTY Right 06/15/2016  ? Procedure: RIGHT TOTAL KNEE ARTHROPLASTY;  Surgeon: Melrose Nakayama, MD;  Location: [place];  Service: Orthopedics;  Laterality: Right;  ? ?Patient Active Problem List  ? Diagnosis Date Noted  ? Abnormal finding on imaging  08/29/2017  ? Right hip pain 08/15/2017  ? S/P TKR (total knee replacement), right 08/11/2017  ? Osteoarthritis of hip 08/11/2017  ? Primary localized osteoarthritis of right knee 06/15/2016  ? Primary osteoarthritis of right knee 06/15/2016  ? Nasal polyps 11/20/2012  ? Essential hypertension 05/28/2010  ? DEPRESSION 02/28/2007  ? Asthma 02/28/2007  ? Osteopenia 02/28/2007  ? Personal hx colon polyps 02/28/2007  ? ? ?PCP: [name], MD ? ?REFERRING PROVIDER: [name], DO ? ?REFERRING DIAG: M25.561 (ICD-10-CM) - Pain in right knee ?M25.562 (ICD-10-CM) - Pain in left knee ? ?THERAPY DIAG:  ?Chronic pain of left knee ? ?Muscle weakness (generalized) ? ?Chronic pain of right knee ? ?Chronic right shoulder pain ? ?ONSET DATE:  ?M25.561 (ICD-10-CM) - Pain in right knee ?M25.562 (ICD-10-CM) - Pain in left knee ? ?SUBJECTIVE:  ? ?SUBJECTIVE STATEMENT: ?States she would like to be able to move her right knee better and she has left knee pain. States that she has to crawl up and down her stairs and she has been gardening but she does use a grabber sometimes. States that she tries to bend her knees while sitting in her recliner. States she was going to  therapy after her manipulation but it got expensive. States that she had some minor relief with her recent knee ablation last year but the pain came back. Goes to [employer] and wants to get back to the pool. Patient wants to avoid surgery on her left knee as she did not do well with her right knee surgery. ? ?PERTINENT HISTORY: ?osteopenia, history of bronchitis and asthma, 08/12/20 right knee ablation, multiple knee injections, history of tka R 12/17 with manipulation on 3/18, hard of hearing, ? ?PAIN:  ?Are you having pain? Yes: NPRS scale: 2/10 ?Pain location: in both knees  ?Pain description: achy ?Aggravating factors: standing, stairs, bending,  ?Relieving factors: rest ? ?PRECAUTIONS: Fall ? ?WEIGHT BEARING RESTRICTIONS No ? ?FALLS:  ?Has patient fallen in last 6  months? Yes. Number of falls 1  walking in garden and leaning forward ? ?LIVING ENVIRONMENT: ?Lives with: lives alone and nearby friend ?Lives in: House/apartment ?Stairs: Yes: Internal: 10 steps; on left going up and External: 4 steps; on right going up states she doesn't have to go up the stairs in the house ?Has following equipment at home: Single point cane uses occasionally, also has quad cane, grabber and sock donner ? ?OCCUPATION: not currently working ? ? ?PLOF: Independent ? ?PATIENT GOALS to move your right knee more and have less pain in left knee ? ? ?OBJECTIVE:  ? ?DIAGNOSTIC FINDINGS: no recent imaging ? ? ?COGNITION: ? Overall cognitive status: Within functional limits for tasks assessed   ?  ?SENSATION: ?WFL ? ? ?POSTURE:  ?Slumped posture, limited hip and knee extension ? ?PALPATION: ?Tenderness to palpation along right knee muscles ? ? ? ? LE Measurements ?Lower Extremity Right ?10/26/2021 Left ?10/26/2021  ? A/PROM MMT A/PROM MMT  ?Hip Flexion  4+  4  ?Hip Extension      ?Hip Abduction      ?Hip Adduction      ?Hip Internal rotation      ?Hip External rotation      ?Knee Flexion 74* 3+ 122* 4  ?Knee Extension Lacking 10 4* Lacking 10* 4  ?Ankle Dorsiflexion      ?Ankle Plantarflexion      ?Ankle Inversion      ?Ankle Eversion      ? (Blank rows = not tested) ? * pain ? ? ? ? ?FUNCTIONAL TESTS:  ?STS: kicks out right leg, uncontrolled descent, uses UE to assist ?Stairs 6" height - step to with unilateral railing going up, step to with bilateral railing coming down ? ?GAIT: ?Distance walked: 25 feet ?Assistive device utilized: Single point cane ?Level of assistance: SBA ?Comments: limited hip and knee motion, bent forward,  ? ? ? ?TODAY'S TREATMENT: ?10/26/2021 ? ?Therapeutic Exercise: ? Aerobic: ?Supine: heel slide hold x3 60" holds, with strap,  ?Prone: ? Seated: hamstring stretch x3 60" holds bilateral ? Standing: ?Neuromuscular Re-education: ?Manual Therapy: ?Therapeutic Activity: ?Self  Care: ?Trigger Point Dry Needling:  ?Modalities:  ? ? ? ?PATIENT EDUCATION:  ?Education details: on current presentation, on HEP, on anticipated outcomes, realistic goals, basic aquatic therapy exercises to try  and POC ?Person educated: Patient ?Education method: Explanation, Demonstration, and Handouts ?Education comprehension: verbalized understanding ? ? ? ?HOME EXERCISE PROGRAM: ?[id number] ? ?ASSESSMENT: ? ?CLINICAL IMPRESSION: ?Patient is a 76 y.o. female who was seen today for physical therapy evaluation and treatment for bilateral knee pain, and right knee ROM limitations. Patient presents with extensive right knee history with history of R TKA in 2017, right knee manipulation in  2018 and right knee nerve ablation in 2022. Given right knee history, patient's overall outcomes are guarded.Discussed extensively the work required to anticipate change and goal being to achieve about the motion she had around the time of her manipulation (last documented knee flexion measurement at 95 degrees). Patient would benefit from skilled PT to improve overall right knee function and reduce pain in left knee to avoid need for additional surgeries on left knee.  ? ? ?OBJECTIVE IMPAIRMENTS Abnormal gait, decreased activity tolerance, decreased balance, decreased knowledge of use of DME, decreased mobility, difficulty walking, decreased ROM, decreased strength, increased edema, and pain.  ? ?ACTIVITY LIMITATIONS cleaning, community activity, meal prep, and yard work.  ? ?PERSONAL FACTORS Age, Fitness, Time since onset of injury/illness/exacerbation, and 3+ comorbidities: R TKA, R knee manipulation, R knee nerve ablation, osteopenia  are also affecting patient's functional outcome.  ? ? ?REHAB POTENTIAL: Fair given right knee history and time since manipulation ? ?CLINICAL DECISION MAKING: Evolving/moderate complexity ? ?EVALUATION COMPLEXITY: Moderate ? ? ?GOALS: ?Goals reviewed with patient?  yes ? ?SHORT TERM  GOALS: ? ?Patient will be independent in self management strategies to improve quality of life and functional outcomes. ?Baseline: new program ?Target date: 11/16/2021 ?Goal status: INITIAL ? ?2.  Patient will report at least 50% im

## 2021-10-27 DIAGNOSIS — M9906 Segmental and somatic dysfunction of lower extremity: Secondary | ICD-10-CM | POA: Diagnosis not present

## 2021-10-27 DIAGNOSIS — M170 Bilateral primary osteoarthritis of knee: Secondary | ICD-10-CM | POA: Diagnosis not present

## 2021-10-27 DIAGNOSIS — M25562 Pain in left knee: Secondary | ICD-10-CM | POA: Diagnosis not present

## 2021-10-29 ENCOUNTER — Encounter: Payer: Self-pay | Admitting: Physical Therapy

## 2021-10-29 ENCOUNTER — Ambulatory Visit: Payer: Medicare HMO | Admitting: Physical Therapy

## 2021-10-29 DIAGNOSIS — M6281 Muscle weakness (generalized): Secondary | ICD-10-CM | POA: Diagnosis not present

## 2021-10-29 DIAGNOSIS — M25562 Pain in left knee: Secondary | ICD-10-CM

## 2021-10-29 DIAGNOSIS — J3081 Allergic rhinitis due to animal (cat) (dog) hair and dander: Secondary | ICD-10-CM | POA: Diagnosis not present

## 2021-10-29 DIAGNOSIS — M25561 Pain in right knee: Secondary | ICD-10-CM

## 2021-10-29 DIAGNOSIS — J301 Allergic rhinitis due to pollen: Secondary | ICD-10-CM | POA: Diagnosis not present

## 2021-10-29 DIAGNOSIS — G8929 Other chronic pain: Secondary | ICD-10-CM | POA: Diagnosis not present

## 2021-10-29 DIAGNOSIS — J3089 Other allergic rhinitis: Secondary | ICD-10-CM | POA: Diagnosis not present

## 2021-11-02 ENCOUNTER — Encounter: Payer: Self-pay | Admitting: Physical Therapy

## 2021-11-02 ENCOUNTER — Ambulatory Visit (INDEPENDENT_AMBULATORY_CARE_PROVIDER_SITE_OTHER): Payer: Medicare HMO | Admitting: Physical Therapy

## 2021-11-02 DIAGNOSIS — M25561 Pain in right knee: Secondary | ICD-10-CM | POA: Diagnosis not present

## 2021-11-02 DIAGNOSIS — M25562 Pain in left knee: Secondary | ICD-10-CM

## 2021-11-02 DIAGNOSIS — M6281 Muscle weakness (generalized): Secondary | ICD-10-CM

## 2021-11-02 DIAGNOSIS — G8929 Other chronic pain: Secondary | ICD-10-CM | POA: Diagnosis not present

## 2021-11-02 NOTE — Therapy (Signed)
?OUTPATIENT PHYSICAL THERAPY TREATMENT NOTE ? ? ?Patient Name: Aimee Lawrence ?MRN: [id number] ?DOB:Nov 15, 1945, 76 y.o., female ?Today's Date: 11/02/2021 ? ?PCP: [name], MD ?REFERRING PROVIDER: [name], MD ? ?END OF SESSION:  ? PT End of Session - 11/02/21 0934   ? ? Visit Number 3   ? Number of Visits 12   ? Date for PT Re-Evaluation 12/07/21   ? Authorization Type humana -auth approved 12 visists from 10/26/21 to 12/09/21   ? Progress Note Due on Visit 10   ? PT Start Time [phone]   ? PT Stop Time 1012   ? PT Time Calculation (min) 38 min   ? Activity Tolerance Patient tolerated treatment well   ? Behavior During Therapy [facility] for tasks assessed/performed   ? ?  ?  ? ?  ? ? ?Past Medical History:  ?Diagnosis Date  ? Allergy   ? takes Doxycycline daily and uses Flonase daily  ? Arthritis   ? Asthma   ? Albuterol inhaler as needed.  ? Dyspnea   ? rarely and sitting  ? GERD (gastroesophageal reflux disease)   ? not on any meds  ? History of bronchitis   ? History of shingles   ? Hx of colonic polyps   ? benign  ? Joint pain   ? Joint swelling   ? Osteopenia   ? takes Vit D3 and Calcium daily  ? Primary localized osteoarthritis of right knee   ? ?Past Surgical History:  ?Procedure Laterality Date  ? APPENDECTOMY    ? CHOLECYSTECTOMY  2007  ? lap choli  ? COLONOSCOPY  10/22/2015  ? per Dr. Carlean Purl, no polyps, repeat in 5 yrs   ? KNEE CLOSED REDUCTION Right 09/14/2016  ? Procedure: CLOSED MANIPULATION KNEE;  Surgeon: Melrose Nakayama, MD;  Location: [place];  Service: Orthopedics;  Laterality: Right;  ? NASAL POLYP SURGERY  2007  ? SINUS ENDO W/FUSION  06/27/2012  ? Procedure: ENDOSCOPIC SINUS SURGERY WITH FUSION NAVIGATION;  Surgeon: Rozetta Nunnery, MD;  Location: [place];  Service: ENT;  Laterality: N/A;  FESS FUSION PROTOCOL  ? TOTAL KNEE ARTHROPLASTY Right 06/15/2016  ? Procedure: RIGHT TOTAL KNEE ARTHROPLASTY;  Surgeon: Melrose Nakayama, MD;  Location: [place];  Service: Orthopedics;   Laterality: Right;  ? ?Patient Active Problem List  ? Diagnosis Date Noted  ? Abnormal finding on imaging 08/29/2017  ? Right hip pain 08/15/2017  ? S/P TKR (total knee replacement), right 08/11/2017  ? Osteoarthritis of hip 08/11/2017  ? Primary localized osteoarthritis of right knee 06/15/2016  ? Primary osteoarthritis of right knee 06/15/2016  ? Nasal polyps 11/20/2012  ? Essential hypertension 05/28/2010  ? DEPRESSION 02/28/2007  ? Asthma 02/28/2007  ? Osteopenia 02/28/2007  ? Personal hx colon polyps 02/28/2007  ? ? ?PCP: [name], MD ?  ?REFERRING PROVIDER: [name], DO ?  ?REFERRING DIAG: M25.561 (ICD-10-CM) - Pain in right knee ?M25.562 (ICD-10-CM) - Pain in left knee ?  ?THERAPY DIAG:  ?Chronic pain of left knee ?  ?Muscle weakness (generalized) ?  ?Chronic pain of right knee ?  ?Chronic right shoulder pain ?  ?ONSET DATE:  ?M25.561 (ICD-10-CM) - Pain in right knee ?M25.562 (ICD-10-CM) - Pain in left knee ?  ?SUBJECTIVE:  ?  ?SUBJECTIVE STATEMENT: ?11/02/2021 ?States on Thursday had good amount of pain/soreness about 6/10. States she wished she had ice after her last session. ? ?Eval: States she would like to be able  to move her right knee better and she has left knee pain. States that she has to crawl up and down her stairs and she has been gardening but she does use a grabber sometimes. States that she tries to bend her knees while sitting in her recliner. States she was going to therapy after her manipulation but it got expensive. States that she had some minor relief with her recent knee ablation last year but the pain came back. Goes to [employer] and wants to get back to the pool. Patient wants to avoid surgery on her left knee as she did not do well with her right knee surgery. ?  ?PERTINENT HISTORY: ?osteopenia, history of bronchitis and asthma, 08/12/20 right knee ablation, multiple knee injections, history of tka R 12/17 with manipulation on 3/18, hard of hearing, ?  ?PAIN:  ?Are you  having pain? Yes: NPRS scale: 3/10 ?Pain location: in both knees  ?Pain description: achy ?Aggravating factors: standing, stairs, bending,  ?Relieving factors: rest ?  ?PRECAUTIONS: Fall ?  ?WEIGHT BEARING RESTRICTIONS No ?  ?FALLS:  ?Has patient fallen in last 6 months? Yes. Number of falls 1  walking in garden and leaning forward ?  ?LIVING ENVIRONMENT: ?Lives with: lives alone and nearby friend ?Lives in: House/apartment ?Stairs: Yes: Internal: 10 steps; on left going up and External: 4 steps; on right going up states she doesn't have to go up the stairs in the house ?Has following equipment at home: Single point cane uses occasionally, also has quad cane, grabber and sock donner ?  ?OCCUPATION: not currently working ?  ?  ?PLOF: Independent ?  ?PATIENT GOALS to move your right knee more and have less pain in left knee ?  ?  ?OBJECTIVE:  ?  ?DIAGNOSTIC FINDINGS: no recent imaging ?  ?  ?COGNITION: ?          Overall cognitive status: Within functional limits for tasks assessed               ?           ?SENSATION: ?WFL ?  ?  ?POSTURE:  ?Slumped posture, limited hip and knee extension ?  ?PALPATION: ?Tenderness to palpation along right knee muscles ?  ?  ?  ?           LE Measurements ?      ?Lower Extremity Right ?10/26/2021 Left ?10/26/2021  ?  A/PROM MMT A/PROM MMT  ?Hip Flexion   4+   4  ?Hip Extension          ?Hip Abduction          ?Hip Adduction          ?Hip Internal rotation          ?Hip External rotation          ?Knee Flexion 74* 3+ 122* 4  ?Knee Extension Lacking 10 4* Lacking 10* 4  ?Ankle Dorsiflexion          ?Ankle Plantarflexion          ?Ankle Inversion          ?Ankle Eversion          ? (Blank rows = not tested) ?           * pain ?  ? Right Knee AROM/PROM ?Date Flexion   Extension  ?10/26/21  74  lacking 10  ?11/01/21 80 Lacking 10   ?    ?      ?      ?      ?      ? ?  FUNCTIONAL TESTS:  ?STS: kicks out right leg, uncontrolled descent, uses UE to assist ?Stairs 6" height - step to with  unilateral railing going up, step to with bilateral railing coming down ?  ?GAIT: ?Distance walked: 25 feet ?Assistive device utilized: Single point cane ?Level of assistance: SBA ?Comments: limited hip and knee motion, bent forward,  ?  ?  ?  ?TODAY'S TREATMENT: ?11/02/2021 ?Therapeutic Exercise: ?     ?Supine:ball flexion stretch on ball 3 minutes, R heel slide hold  stretches x5 20" holds, bridges 3x10 5" holds ?Prone: ?   Seated: LAQ 2x10 5" holds R, AAROM R knee flexion 5 minutes total, chair pose x10 10" holds  ?   Standing: ?Neuromuscular Re-education: ?Manual Therapy:  ?Therapeutic Activity: STS from elevated surface - 12 minutes cues to bend knees, slow descent tactile support 22.5 inches - pauses with decent ? ?CRYOTHERAPY- To both knees elevated knee end of session 5 minutes end of session - educated during this time ?  ?  ?PATIENT EDUCATION:  ?Education details: on  HEP, on rationale fore exercises, on current presentation and how to mimic exorcises at home. On importance of exercises/mobility throughout the day. ?Person educated: Patient ?Education method: Explanation, Demonstration, and Handouts ?Education comprehension: verbalized understanding ?  ?  ?  ?HOME EXERCISE PROGRAM: ?[id number] ?  ?ASSESSMENT: ?  ?CLINICAL IMPRESSION: ?11/02/2021 ?Continued to focus on mobility and functional knee strength. Continues to need cues for STS. Improving in knee flexion from eval. Educated with ice per patient request. Will continue with current POC.  ? ?Eval: Patient is a 76 y.o. female who was seen today for physical therapy evaluation and treatment for bilateral knee pain, and right knee ROM limitations. Patient presents with extensive right knee history with history of R TKA in 2017, right knee manipulation in 2018 and right knee nerve ablation in 2022. Given right knee history, patient's overall outcomes are guarded.Discussed extensively the work required to anticipate change and goal being to achieve about the  motion she had around the time of her manipulation (last documented knee flexion measurement at 95 degrees). Patient would benefit from skilled PT to improve overall right knee function and reduce pain in left knee to

## 2021-11-04 ENCOUNTER — Ambulatory Visit: Payer: Medicare HMO | Admitting: Physical Therapy

## 2021-11-04 ENCOUNTER — Encounter: Payer: Self-pay | Admitting: Physical Therapy

## 2021-11-04 DIAGNOSIS — M25561 Pain in right knee: Secondary | ICD-10-CM

## 2021-11-04 DIAGNOSIS — M6281 Muscle weakness (generalized): Secondary | ICD-10-CM | POA: Diagnosis not present

## 2021-11-04 DIAGNOSIS — G8929 Other chronic pain: Secondary | ICD-10-CM | POA: Diagnosis not present

## 2021-11-04 DIAGNOSIS — M25562 Pain in left knee: Secondary | ICD-10-CM

## 2021-11-04 NOTE — Therapy (Signed)
?OUTPATIENT PHYSICAL THERAPY TREATMENT NOTE ? ? ?Patient Name: Aimee Lawrence ?MRN: [id number] ?DOB:[birth year], 76 y.o., female ?Today's Date: 11/04/2021 ? ?PCP: [name], MD ?REFERRING PROVIDER: [name], MD ? ?END OF SESSION:  ? PT End of Session - 11/04/21 5465   ? ? Visit Number 4   ? Number of Visits 12   ? Date for PT Re-Evaluation 12/07/21   ? Authorization Type humana -auth approved 12 visists from 10/26/21 to 12/09/21   ? Authorization - Visit Number 4   ? Authorization - Number of Visits 12   ? Progress Note Due on Visit 10   ? PT Start Time [phone]   ? PT Stop Time 1014   ? PT Time Calculation (min) 38 min   ? Activity Tolerance Patient tolerated treatment well   ? Behavior During Therapy [facility] for tasks assessed/performed   ? ?  ?  ? ?  ? ? ?Past Medical History:  ?Diagnosis Date  ? Allergy   ? takes Doxycycline daily and uses Flonase daily  ? Arthritis   ? Asthma   ? Albuterol inhaler as needed.  ? Dyspnea   ? rarely and sitting  ? GERD (gastroesophageal reflux disease)   ? not on any meds  ? History of bronchitis   ? History of shingles   ? Hx of colonic polyps   ? benign  ? Joint pain   ? Joint swelling   ? Osteopenia   ? takes Vit D3 and Calcium daily  ? Primary localized osteoarthritis of right knee   ? ?Past Surgical History:  ?Procedure Laterality Date  ? APPENDECTOMY    ? CHOLECYSTECTOMY  2007  ? lap choli  ? COLONOSCOPY  10/22/2015  ? per Dr. Carlean Purl, no polyps, repeat in 5 yrs   ? KNEE CLOSED REDUCTION Right 09/14/2016  ? Procedure: CLOSED MANIPULATION KNEE;  Surgeon: Melrose Nakayama, MD;  Location: [place];  Service: Orthopedics;  Laterality: Right;  ? NASAL POLYP SURGERY  2007  ? SINUS ENDO W/FUSION  06/27/2012  ? Procedure: ENDOSCOPIC SINUS SURGERY WITH FUSION NAVIGATION;  Surgeon: Rozetta Nunnery, MD;  Location: [place];  Service: ENT;  Laterality: N/A;  FESS FUSION PROTOCOL  ? TOTAL KNEE ARTHROPLASTY Right 06/15/2016  ? Procedure: RIGHT TOTAL KNEE ARTHROPLASTY;   Surgeon: Melrose Nakayama, MD;  Location: [place];  Service: Orthopedics;  Laterality: Right;  ? ?Patient Active Problem List  ? Diagnosis Date Noted  ? Abnormal finding on imaging 08/29/2017  ? Right hip pain 08/15/2017  ? S/P TKR (total knee replacement), right 08/11/2017  ? Osteoarthritis of hip 08/11/2017  ? Primary localized osteoarthritis of right knee 06/15/2016  ? Primary osteoarthritis of right knee 06/15/2016  ? Nasal polyps 11/20/2012  ? Essential hypertension 05/28/2010  ? DEPRESSION 02/28/2007  ? Asthma 02/28/2007  ? Osteopenia 02/28/2007  ? Personal hx colon polyps 02/28/2007  ? ? ?PCP: [name], MD ?  ?REFERRING PROVIDER: [name], DO ?  ?REFERRING DIAG: M25.561 (ICD-10-CM) - Pain in right knee ?M25.562 (ICD-10-CM) - Pain in left knee ?  ?THERAPY DIAG:  ?Chronic pain of left knee ?  ?Muscle weakness (generalized) ?  ?Chronic pain of right knee ?  ?Chronic right shoulder pain ?  ?ONSET DATE:  ? ?  ?SUBJECTIVE:  ?  ?SUBJECTIVE STATEMENT: ?11/04/2021 ?States she has had some soreness in both legs, didn't hurt after last session but she had a busy day. Reports yesterday morning she felt  like she could barely move her leg but then by the afternoon he was better.  ? ?Eval: States she would like to be able to move her right knee better and she has left knee pain. States that she has to crawl up and down her stairs and she has been gardening but she does use a grabber sometimes. States that she tries to bend her knees while sitting in her recliner. States she was going to therapy after her manipulation but it got expensive. States that she had some minor relief with her recent knee ablation last year but the pain came back. Goes to [employer] and wants to get back to the pool. Patient wants to avoid surgery on her left knee as she did not do well with her right knee surgery. ?  ?PERTINENT HISTORY: ?osteopenia, history of bronchitis and asthma, 08/12/20 right knee ablation, multiple knee injections,  history of tka R 12/17 with manipulation on 3/18, hard of hearing, ?  ?PAIN:  ?Are you having pain? Yes: NPRS scale: 3/10 ?Pain location: in both knees  ?Pain description: achy ?Aggravating factors: standing, stairs, bending,  ?Relieving factors: rest ?  ?PRECAUTIONS: Fall ?  ?WEIGHT BEARING RESTRICTIONS No ?  ?FALLS:  ?Has patient fallen in last 6 months? Yes. Number of falls 1  walking in garden and leaning forward ?  ?LIVING ENVIRONMENT: ?Lives with: lives alone and nearby friend ?Lives in: House/apartment ?Stairs: Yes: Internal: 10 steps; on left going up and External: 4 steps; on right going up states she doesn't have to go up the stairs in the house ?Has following equipment at home: Single point cane uses occasionally, also has quad cane, grabber and sock donner ?  ?OCCUPATION: not currently working ?  ?  ?PLOF: Independent ?  ?PATIENT GOALS to move your right knee more and have less pain in left knee ?  ?  ?OBJECTIVE:  ?  ?DIAGNOSTIC FINDINGS: no recent imaging ?  ?  ?COGNITION: ?          Overall cognitive status: Within functional limits for tasks assessed               ?           ?SENSATION: ?WFL ?  ?  ?POSTURE:  ?Slumped posture, limited hip and knee extension ?  ?PALPATION: ?Tenderness to palpation along right knee muscles ?  ?  ?  ?           LE Measurements ?      ?Lower Extremity Right ?10/26/2021 Left ?10/26/2021  ?  A/PROM MMT A/PROM MMT  ?Hip Flexion   4+   4  ?Hip Extension          ?Hip Abduction          ?Hip Adduction          ?Hip Internal rotation          ?Hip External rotation          ?Knee Flexion 74* 3+ 122* 4  ?Knee Extension Lacking 10 4* Lacking 10* 4  ?Ankle Dorsiflexion          ?Ankle Plantarflexion          ?Ankle Inversion          ?Ankle Eversion          ? (Blank rows = not tested) ?           * pain ?  ? Right Knee AROM/PROM ?Date Flexion  Extension  ?10/26/21  74  lacking 10  ?11/01/21 80 Lacking 10   ?11/04/21 78 Lacking 10  ?      ?      ?      ?      ? ?FUNCTIONAL TESTS:   ?STS: kicks out right leg, uncontrolled descent, uses UE to assist ?Stairs 6" height - step to with unilateral railing going up, step to with bilateral railing coming down ?  ?GAIT: ?Distance walked: 25 feet ?Assistive device utilized: Single point cane ?Level of assistance: SBA ?Comments: limited hip and knee motion, bent forward,  ?  ?  ?  ?TODAY'S TREATMENT: ?11/04/2021 ?Therapeutic Exercise: ?Supine: R heel slide hold  stretches with strap x4 60" holds ?Prone: knee flexion 3x10 5" holds Bilateral, TKE 3x45 second bouts 5" holds bilateral ?Seated: Self mobilization to bilateral legs with tier tail 4 minutes each leg ?   Standing:Steps 4" UE support for balance (intermittent) x3 1 minutes sets R only. ?Neuromuscular Re-education: ?Manual Therapy:  ? ?Previous Interventions:  ?Therapeutic Activity: STS from elevated surface - 12 minutes cues to bend knees, slow descent tactile support 22.5 inches - pauses with decent ? ? Seated: LAQ 2x10 5" holds R, AAROM R knee flexion 5 minutes total, chair pose x10 10" holds  ? ?CRYOTHERAPY- To both knees elevated knee end of session 5 minutes end of session - educated during this time ?  ?  ?PATIENT EDUCATION:  ?Education details: on  HEP ?and how to mimic exorcises at home. On importance of exercises/mobility throughout the day. ?Person educated: Patient ?Education method: Explanation, Demonstration, and Handouts ?Education comprehension: verbalized understanding ?  ?  ?  ?HOME EXERCISE PROGRAM: ?[id number] ?  ?ASSESSMENT: ?  ?CLINICAL IMPRESSION: ?11/04/2021 ?Added prone exercises which were tolerated well. Soreness noted in bilateral legs today. Added self soft tissue mobilization which was tolerated moderately well. No ice at end of session. ? ?Eval: Patient is a 76 y.o. female who was seen today for physical therapy evaluation and treatment for bilateral knee pain, and right knee ROM limitations. Patient presents with extensive right knee history with history of R TKA in  2017, right knee manipulation in 2018 and right knee nerve ablation in 2022. Given right knee history, patient's overall outcomes are guarded.Discussed extensively the work required to anticipate change and goal b

## 2021-11-06 DIAGNOSIS — J301 Allergic rhinitis due to pollen: Secondary | ICD-10-CM | POA: Diagnosis not present

## 2021-11-06 DIAGNOSIS — J3081 Allergic rhinitis due to animal (cat) (dog) hair and dander: Secondary | ICD-10-CM | POA: Diagnosis not present

## 2021-11-06 DIAGNOSIS — J3089 Other allergic rhinitis: Secondary | ICD-10-CM | POA: Diagnosis not present

## 2021-11-11 ENCOUNTER — Ambulatory Visit: Payer: Medicare HMO | Admitting: Physical Therapy

## 2021-11-11 DIAGNOSIS — G8929 Other chronic pain: Secondary | ICD-10-CM | POA: Diagnosis not present

## 2021-11-11 DIAGNOSIS — M6281 Muscle weakness (generalized): Secondary | ICD-10-CM | POA: Diagnosis not present

## 2021-11-11 DIAGNOSIS — M25562 Pain in left knee: Secondary | ICD-10-CM

## 2021-11-11 DIAGNOSIS — M25561 Pain in right knee: Secondary | ICD-10-CM

## 2021-11-11 NOTE — Therapy (Signed)
?OUTPATIENT PHYSICAL THERAPY TREATMENT NOTE ? ? ?Patient Name: Aimee Lawrence ?MRN: [id number] ?DOB:1945-10-13, 76 y.o., female ?Today's Date: 11/11/2021 ? ?PCP: [name], MD ?REFERRING PROVIDER: [name], MD ? ?END OF SESSION:  ? PT End of Session - 11/15/21 2024   ? ? Visit Number 5   ? Number of Visits 12   ? Date for PT Re-Evaluation 12/07/21   ? Authorization Type humana -auth approved 12 visists from 10/26/21 to 12/09/21   ? Authorization - Visit Number 5   ? Authorization - Number of Visits 12   ? Progress Note Due on Visit 10   ? PT Start Time 1302   ? PT Stop Time 1344   ? PT Time Calculation (min) 42 min   ? Activity Tolerance Patient tolerated treatment well   ? Behavior During Therapy [facility] for tasks assessed/performed   ? ?  ?  ? ?  ? ? ? ?Past Medical History:  ?Diagnosis Date  ? Allergy   ? takes Doxycycline daily and uses Flonase daily  ? Arthritis   ? Asthma   ? Albuterol inhaler as needed.  ? Dyspnea   ? rarely and sitting  ? GERD (gastroesophageal reflux disease)   ? not on any meds  ? History of bronchitis   ? History of shingles   ? Hx of colonic polyps   ? benign  ? Joint pain   ? Joint swelling   ? Osteopenia   ? takes Vit D3 and Calcium daily  ? Primary localized osteoarthritis of right knee   ? ?Past Surgical History:  ?Procedure Laterality Date  ? APPENDECTOMY    ? CHOLECYSTECTOMY  2007  ? lap choli  ? COLONOSCOPY  10/22/2015  ? per Dr. Carlean Purl, no polyps, repeat in 5 yrs   ? KNEE CLOSED REDUCTION Right 09/14/2016  ? Procedure: CLOSED MANIPULATION KNEE;  Surgeon: Melrose Nakayama, MD;  Location: [place];  Service: Orthopedics;  Laterality: Right;  ? NASAL POLYP SURGERY  2007  ? SINUS ENDO W/FUSION  06/27/2012  ? Procedure: ENDOSCOPIC SINUS SURGERY WITH FUSION NAVIGATION;  Surgeon: Rozetta Nunnery, MD;  Location: [place];  Service: ENT;  Laterality: N/A;  FESS FUSION PROTOCOL  ? TOTAL KNEE ARTHROPLASTY Right 06/15/2016  ? Procedure: RIGHT TOTAL KNEE ARTHROPLASTY;   Surgeon: Melrose Nakayama, MD;  Location: [place];  Service: Orthopedics;  Laterality: Right;  ? ?Patient Active Problem List  ? Diagnosis Date Noted  ? Abnormal finding on imaging 08/29/2017  ? Right hip pain 08/15/2017  ? S/P TKR (total knee replacement), right 08/11/2017  ? Osteoarthritis of hip 08/11/2017  ? Primary localized osteoarthritis of right knee 06/15/2016  ? Primary osteoarthritis of right knee 06/15/2016  ? Nasal polyps 11/20/2012  ? Essential hypertension 05/28/2010  ? DEPRESSION 02/28/2007  ? Asthma 02/28/2007  ? Osteopenia 02/28/2007  ? Personal hx colon polyps 02/28/2007  ? ? ?PCP: [name], MD ?  ?REFERRING PROVIDER: [name], DO ?  ?REFERRING DIAG: M25.561 (ICD-10-CM) - Pain in right knee ?M25.562 (ICD-10-CM) - Pain in left knee ?  ?THERAPY DIAG:  ?Chronic pain of left knee ?  ?Muscle weakness (generalized) ?  ?Chronic pain of right knee ?  ?Chronic right shoulder pain ?  ?ONSET DATE:  ? ?  ?SUBJECTIVE:  ?  ?SUBJECTIVE STATEMENT: ?11/11/2021 ?Pt with no new complaints.   ? ?Eval: States she would like to be able to move her right knee better and she  has left knee pain. States that she has to crawl up and down her stairs and she has been gardening but she does use a grabber sometimes. States that she tries to bend her knees while sitting in her recliner. States she was going to therapy after her manipulation but it got expensive. States that she had some minor relief with her recent knee ablation last year but the pain came back. Goes to [employer] and wants to get back to the pool. Patient wants to avoid surgery on her left knee as she did not do well with her right knee surgery. ?  ?PERTINENT HISTORY: ?osteopenia, history of bronchitis and asthma, 08/12/20 right knee ablation, multiple knee injections, history of tka R 12/17 with manipulation on 3/18, hard of hearing, ?  ?PAIN:  ?Are you having pain? Yes: NPRS scale: 3/10 ?Pain location: in both knees  ?Pain description: achy ?Aggravating  factors: standing, stairs, bending,  ?Relieving factors: rest ?  ?PRECAUTIONS: Fall ?  ?WEIGHT BEARING RESTRICTIONS No ?  ?FALLS:  ?Has patient fallen in last 6 months? Yes. Number of falls 1  walking in garden and leaning forward ?  ?LIVING ENVIRONMENT: ?Lives with: lives alone and nearby friend ?Lives in: House/apartment ?Stairs: Yes: Internal: 10 steps; on left going up and External: 4 steps; on right going up states she doesn't have to go up the stairs in the house ?Has following equipment at home: Single point cane uses occasionally, also has quad cane, grabber and sock donner ?  ?OCCUPATION: not currently working ?  ?  ?PLOF: Independent ?  ?PATIENT GOALS to move your right knee more and have less pain in left knee ?  ?  ?OBJECTIVE:  ?  ?DIAGNOSTIC FINDINGS: no recent imaging ?  ?  ?COGNITION: ?          Overall cognitive status: Within functional limits for tasks assessed               ?           ?SENSATION: ?WFL ?  ?  ?POSTURE:  ?Slumped posture, limited hip and knee extension ?  ?PALPATION: ?Tenderness to palpation along right knee muscles ?  ?  ?  ?           LE Measurements ?      ?Lower Extremity Right ?10/26/2021 Left ?10/26/2021  ?  A/PROM MMT A/PROM MMT  ?Hip Flexion   4+   4  ?Hip Extension          ?Hip Abduction          ?Hip Adduction          ?Hip Internal rotation          ?Hip External rotation          ?Knee Flexion 74* 3+ 122* 4  ?Knee Extension Lacking 10 4* Lacking 10* 4  ?Ankle Dorsiflexion          ?Ankle Plantarflexion          ?Ankle Inversion          ?Ankle Eversion          ? (Blank rows = not tested) ?           * pain ?  ? Right Knee AROM/PROM ?Date Flexion   Extension  ?10/26/21  74  lacking 10  ?11/01/21 80 Lacking 10   ?11/04/21 78 Lacking 10  ?      ?      ?      ?      ? ?  FUNCTIONAL TESTS:  ?STS: kicks out right leg, uncontrolled descent, uses UE to assist ?Stairs 6" height - step to with unilateral railing going up, step to with bilateral railing coming down ?  ?GAIT: ?Distance  walked: 25 feet ?Assistive device utilized: Single point cane ?Level of assistance: SBA ?Comments: limited hip and knee motion, bent forward,  ?  ?  ?  ?TODAY'S TREATMENT: ?11/11/2021 ?Therapeutic Exercise: ?Supine: R heel slide hold  stretches with strap x4 60" holds;  ?Prone: knee flexion 3x10 5" holds Bilateral,  ?Seated: LAQ x 20 on R; Sit to stand x 10 high mat table with education on mechanics for increased weight bearing on R LE.  ?   Standing: Steps 4" UE support for balance (intermittent) x3 1 minutes sets R only. ?Neuromuscular Re-education: ?Manual Therapy:  PROM for improving knee flexion, in seated, prone, and supine; R hip inf and post mobs;  ? ? ?Previous Interventions:  ?Therapeutic Activity: STS from elevated surface - 12 minutes cues to bend knees, slow descent tactile support 22.5 inches - pauses with decent ? ? Seated: LAQ 2x10 5" holds R, AAROM R knee flexion 5 minutes total, chair pose x10 10" holds  ? ?CRYOTHERAPY- To both knees elevated knee end of session 5 minutes end of session - educated during this time ?  ?  ?PATIENT EDUCATION:  ?Education details: HEP reviewed  ?Person educated: Patient ?Education method: Explanation, Demonstration, and Handouts ?Education comprehension: verbalized understanding ?  ?  ?  ?HOME EXERCISE PROGRAM: ?[id number] ?  ?ASSESSMENT: ?  ?CLINICAL IMPRESSION: ?11/11/2021: Pt with good tolerance for manual and knee flexion stretching. She also has noted stiffness in R hip with attempts for [facility] position for knee flexion stretch. She will benefit from continued ROM and functional strengthening.  ? ? ? ? ?Eval: Patient is a 76 y.o. female who was seen today for physical therapy evaluation and treatment for bilateral knee pain, and right knee ROM limitations. Patient presents with extensive right knee history with history of R TKA in 2017, right knee manipulation in 2018 and right knee nerve ablation in 2022. Given right knee history, patient's overall outcomes are  guarded.Discussed extensively the work required to anticipate change and goal being to achieve about the motion she had around the time of her manipulation (last documented knee flexion measurement at 95 degree

## 2021-11-12 DIAGNOSIS — J301 Allergic rhinitis due to pollen: Secondary | ICD-10-CM | POA: Diagnosis not present

## 2021-11-12 DIAGNOSIS — J3089 Other allergic rhinitis: Secondary | ICD-10-CM | POA: Diagnosis not present

## 2021-11-12 DIAGNOSIS — J3081 Allergic rhinitis due to animal (cat) (dog) hair and dander: Secondary | ICD-10-CM | POA: Diagnosis not present

## 2021-11-15 ENCOUNTER — Encounter: Payer: Self-pay | Admitting: Physical Therapy

## 2021-11-17 ENCOUNTER — Encounter: Payer: Self-pay | Admitting: Physical Therapy

## 2021-11-17 ENCOUNTER — Ambulatory Visit: Payer: Medicare HMO | Admitting: Physical Therapy

## 2021-11-17 DIAGNOSIS — G8929 Other chronic pain: Secondary | ICD-10-CM | POA: Diagnosis not present

## 2021-11-17 DIAGNOSIS — M25561 Pain in right knee: Secondary | ICD-10-CM | POA: Diagnosis not present

## 2021-11-17 DIAGNOSIS — M25562 Pain in left knee: Secondary | ICD-10-CM | POA: Diagnosis not present

## 2021-11-17 DIAGNOSIS — M6281 Muscle weakness (generalized): Secondary | ICD-10-CM

## 2021-11-17 NOTE — Therapy (Signed)
?OUTPATIENT PHYSICAL THERAPY TREATMENT NOTE ? ? ?Patient Name: Aimee Lawrence ?MRN: [id number] ?DOB:[birth year], 76 y.o., female ?Today's Date: 11/11/2021 ? ?PCP: [name], MD ?REFERRING PROVIDER: [name], MD ? ?END OF SESSION:  ? PT End of Session - 11/17/21 1102   ? ? Visit Number 6   ? Number of Visits 12   ? Date for PT Re-Evaluation 12/07/21   ? Authorization Type humana -auth approved 12 visists from 10/26/21 to 12/09/21   ? Authorization - Visit Number 6   ? Authorization - Number of Visits 12   ? Progress Note Due on Visit 10   ? PT Start Time 1102   ? PT Stop Time 1140   ? PT Time Calculation (min) 38 min   ? Activity Tolerance Patient tolerated treatment well   ? Behavior During Therapy [facility] for tasks assessed/performed   ? ?  ?  ? ?  ? ? ? ?Past Medical History:  ?Diagnosis Date  ? Allergy   ? takes Doxycycline daily and uses Flonase daily  ? Arthritis   ? Asthma   ? Albuterol inhaler as needed.  ? Dyspnea   ? rarely and sitting  ? GERD (gastroesophageal reflux disease)   ? not on any meds  ? History of bronchitis   ? History of shingles   ? Hx of colonic polyps   ? benign  ? Joint pain   ? Joint swelling   ? Osteopenia   ? takes Vit D3 and Calcium daily  ? Primary localized osteoarthritis of right knee   ? ?Past Surgical History:  ?Procedure Laterality Date  ? APPENDECTOMY    ? CHOLECYSTECTOMY  2007  ? lap choli  ? COLONOSCOPY  10/22/2015  ? per Dr. Carlean Purl, no polyps, repeat in 5 yrs   ? KNEE CLOSED REDUCTION Right 09/14/2016  ? Procedure: CLOSED MANIPULATION KNEE;  Surgeon: Melrose Nakayama, MD;  Location: [place];  Service: Orthopedics;  Laterality: Right;  ? NASAL POLYP SURGERY  2007  ? SINUS ENDO W/FUSION  06/27/2012  ? Procedure: ENDOSCOPIC SINUS SURGERY WITH FUSION NAVIGATION;  Surgeon: Rozetta Nunnery, MD;  Location: [place];  Service: ENT;  Laterality: N/A;  FESS FUSION PROTOCOL  ? TOTAL KNEE ARTHROPLASTY Right 06/15/2016  ? Procedure: RIGHT TOTAL KNEE ARTHROPLASTY;   Surgeon: Melrose Nakayama, MD;  Location: [place];  Service: Orthopedics;  Laterality: Right;  ? ?Patient Active Problem List  ? Diagnosis Date Noted  ? Abnormal finding on imaging 08/29/2017  ? Right hip pain 08/15/2017  ? S/P TKR (total knee replacement), right 08/11/2017  ? Osteoarthritis of hip 08/11/2017  ? Primary localized osteoarthritis of right knee 06/15/2016  ? Primary osteoarthritis of right knee 06/15/2016  ? Nasal polyps 11/20/2012  ? Essential hypertension 05/28/2010  ? DEPRESSION 02/28/2007  ? Asthma 02/28/2007  ? Osteopenia 02/28/2007  ? Personal hx colon polyps 02/28/2007  ? ? ?PCP: [name], MD ?  ?REFERRING PROVIDER: [name], DO ?  ?REFERRING DIAG: M25.561 (ICD-10-CM) - Pain in right knee ?M25.562 (ICD-10-CM) - Pain in left knee ?  ?THERAPY DIAG:  ?Chronic pain of left knee ?  ?Muscle weakness (generalized) ?  ?Chronic pain of right knee ?  ?Chronic right shoulder pain ?  ?ONSET DATE:  ? ?  ?SUBJECTIVE:  ?  ?SUBJECTIVE STATEMENT: ?11/11/2021 ?States she is feeling some pain and stiffness but nothing more than usual  ? ?Eval: States she would like to be able to  move her right knee better and she has left knee pain. States that she has to crawl up and down her stairs and she has been gardening but she does use a grabber sometimes. States that she tries to bend her knees while sitting in her recliner. States she was going to therapy after her manipulation but it got expensive. States that she had some minor relief with her recent knee ablation last year but the pain came back. Goes to [employer] and wants to get back to the pool. Patient wants to avoid surgery on her left knee as she did not do well with her right knee surgery. ?  ?PERTINENT HISTORY: ?osteopenia, history of bronchitis and asthma, 08/12/20 right knee ablation, multiple knee injections, history of tka R 12/17 with manipulation on 3/18, hard of hearing, ?  ?PAIN:  ?Are you having pain? Yes: NPRS scale: 3/10 ?Pain location: in both  knees  ?Pain description: achy ?Aggravating factors: standing, stairs, bending,  ?Relieving factors: rest ?  ?PRECAUTIONS: Fall ?  ?WEIGHT BEARING RESTRICTIONS No ?  ?FALLS:  ?Has patient fallen in last 6 months? Yes. Number of falls 1  walking in garden and leaning forward ?  ?LIVING ENVIRONMENT: ?Lives with: lives alone and nearby friend ?Lives in: House/apartment ?Stairs: Yes: Internal: 10 steps; on left going up and External: 4 steps; on right going up states she doesn't have to go up the stairs in the house ?Has following equipment at home: Single point cane uses occasionally, also has quad cane, grabber and sock donner ?  ?OCCUPATION: not currently working ?  ?  ?PLOF: Independent ?  ?PATIENT GOALS to move your right knee more and have less pain in left knee ?  ?  ?OBJECTIVE:  ?  ?DIAGNOSTIC FINDINGS: no recent imaging ?  ?  ?COGNITION: ?          Overall cognitive status: Within functional limits for tasks assessed               ?           ?SENSATION: ?WFL ?  ?  ?POSTURE:  ?Slumped posture, limited hip and knee extension ?  ?PALPATION: ?Tenderness to palpation along right knee muscles ?  ?  ?  ?           LE Measurements ?      ?Lower Extremity Right ?10/26/2021 Left ?10/26/2021  ?  A/PROM MMT A/PROM MMT  ?Hip Flexion   4+   4  ?Hip Extension          ?Hip Abduction          ?Hip Adduction          ?Hip Internal rotation          ?Hip External rotation          ?Knee Flexion 74* 3+ 122* 4  ?Knee Extension Lacking 10 4* Lacking 10* 4  ?Ankle Dorsiflexion          ?Ankle Plantarflexion          ?Ankle Inversion          ?Ankle Eversion          ? (Blank rows = not tested) ?           * pain ?  ? Right Knee AROM/PROM ?Date Flexion   Extension  ?10/26/21  74  lacking 10  ?11/01/21 80 Lacking 10   ?11/04/21 78 Lacking 10  ?11/17/21  82  Lacking 8  ?      ?      ?      ? ?  FUNCTIONAL TESTS:  ?STS: kicks out right leg, uncontrolled descent, uses UE to assist ?Stairs 6" height - step to with unilateral railing going up,  step to with bilateral railing coming down ?  ?GAIT: ?Distance walked: 25 feet ?Assistive device utilized: Single point cane ?Level of assistance: SBA ?Comments: limited hip and knee motion, bent forward,  ?  ?  ?  ?TODAY'S TREATMENT: ?11/17/2021 ?Therapeutic Activity: STS from elevated surface - 10 minutes cues to bend knees, slow descent tactile support 22.0 inches - pauses with decent ? ?Therapeutic Exercise: ?Supine: R heel slide hold  stretches with strap x4 60" holds;  ? Seated: LAQ 1x15 5" holds R, AAROM R knee flexion x15 5" holds, ?    Standing: knee flexion stretch with foot on 6" step x5 15" holds R ?Steps 4" UE support for balance R only 3 minutes total  ?Quadruped: rock backs x10 10" holds ?Prone: quad stretch with strap x3 60" holds R only, hip extension  ?Neuromuscular Re-education: ?Manual Therapy:   ? ? ?Previous Interventions:  ? ? Seated: LAQ 1x15 5" holds R, AAROM R knee flexion x15 5" holds,  ? ?CRYOTHERAPY- To both knees elevated knee end of session 5 minutes end of session - educated during this time ?  ?  ?PATIENT EDUCATION:  ?Education details: HEP reviewed  ?Person educated: Patient ?Education method: Explanation, Demonstration, and Handouts ?Education comprehension: verbalized understanding ?  ?  ?  ?HOME EXERCISE PROGRAM: ?[id number] ?  ?ASSESSMENT: ?  ?CLINICAL IMPRESSION: ?11/17/2021 ? ?Session continued to focused on knee motion and strength. Tolerated  well and improved motion noted in both flexion and extension on right. Fatigue and soreness noted end of session but no increase in pain. Will continue with current POC ? ? ?Eval: Patient is a 76 y.o. female who was seen today for physical therapy evaluation and treatment for bilateral knee pain, and right knee ROM limitations. Patient presents with extensive right knee history with history of R TKA in 2017, right knee manipulation in 2018 and right knee nerve ablation in 2022. Given right knee history, patient's overall outcomes are  guarded.Discussed extensively the work required to anticipate change and goal being to achieve about the motion she had around the time of her manipulation (last documented knee flexion measurement at 95 degre

## 2021-11-19 ENCOUNTER — Ambulatory Visit: Payer: Medicare HMO | Admitting: Physical Therapy

## 2021-11-19 ENCOUNTER — Encounter: Payer: Self-pay | Admitting: Physical Therapy

## 2021-11-19 DIAGNOSIS — M25561 Pain in right knee: Secondary | ICD-10-CM

## 2021-11-19 DIAGNOSIS — G8929 Other chronic pain: Secondary | ICD-10-CM | POA: Diagnosis not present

## 2021-11-19 DIAGNOSIS — M6281 Muscle weakness (generalized): Secondary | ICD-10-CM

## 2021-11-19 DIAGNOSIS — M25562 Pain in left knee: Secondary | ICD-10-CM

## 2021-11-19 NOTE — Therapy (Signed)
?OUTPATIENT PHYSICAL THERAPY TREATMENT NOTE ? ? ?Patient Name: Aimee Lawrence ?MRN: [id number] ?DOB:04/18/1946, 76 y.o., female ?Today's Date: 11/11/2021 ? ?PCP: [name], MD ?REFERRING PROVIDER: [name], MD ? ?END OF SESSION:  ? PT End of Session - 11/19/21 1018   ? ? Visit Number 7   ? Number of Visits 12   ? Date for PT Re-Evaluation 12/07/21   ? Authorization Type humana -auth approved 12 visists from 10/26/21 to 12/09/21   ? Authorization - Visit Number 7   ? Authorization - Number of Visits 12   ? Progress Note Due on Visit 10   ? PT Start Time 1018   ? PT Stop Time 1056   ? PT Time Calculation (min) 38 min   ? Activity Tolerance Patient tolerated treatment well   ? Behavior During Therapy [facility] for tasks assessed/performed   ? ?  ?  ? ?  ? ? ? ?Past Medical History:  ?Diagnosis Date  ? Allergy   ? takes Doxycycline daily and uses Flonase daily  ? Arthritis   ? Asthma   ? Albuterol inhaler as needed.  ? Dyspnea   ? rarely and sitting  ? GERD (gastroesophageal reflux disease)   ? not on any meds  ? History of bronchitis   ? History of shingles   ? Hx of colonic polyps   ? benign  ? Joint pain   ? Joint swelling   ? Osteopenia   ? takes Vit D3 and Calcium daily  ? Primary localized osteoarthritis of right knee   ? ?Past Surgical History:  ?Procedure Laterality Date  ? APPENDECTOMY    ? CHOLECYSTECTOMY  2007  ? lap choli  ? COLONOSCOPY  10/22/2015  ? per Dr. Carlean Purl, no polyps, repeat in 5 yrs   ? KNEE CLOSED REDUCTION Right 09/14/2016  ? Procedure: CLOSED MANIPULATION KNEE;  Surgeon: Melrose Nakayama, MD;  Location: [place];  Service: Orthopedics;  Laterality: Right;  ? NASAL POLYP SURGERY  2007  ? SINUS ENDO W/FUSION  06/27/2012  ? Procedure: ENDOSCOPIC SINUS SURGERY WITH FUSION NAVIGATION;  Surgeon: Rozetta Nunnery, MD;  Location: [place];  Service: ENT;  Laterality: N/A;  FESS FUSION PROTOCOL  ? TOTAL KNEE ARTHROPLASTY Right 06/15/2016  ? Procedure: RIGHT TOTAL KNEE ARTHROPLASTY;   Surgeon: Melrose Nakayama, MD;  Location: [place];  Service: Orthopedics;  Laterality: Right;  ? ?Patient Active Problem List  ? Diagnosis Date Noted  ? Abnormal finding on imaging 08/29/2017  ? Right hip pain 08/15/2017  ? S/P TKR (total knee replacement), right 08/11/2017  ? Osteoarthritis of hip 08/11/2017  ? Primary localized osteoarthritis of right knee 06/15/2016  ? Primary osteoarthritis of right knee 06/15/2016  ? Nasal polyps 11/20/2012  ? Essential hypertension 05/28/2010  ? DEPRESSION 02/28/2007  ? Asthma 02/28/2007  ? Osteopenia 02/28/2007  ? Personal hx colon polyps 02/28/2007  ? ? ?PCP: [name], MD ?  ?REFERRING PROVIDER: [name], DO ?  ?REFERRING DIAG: M25.561 (ICD-10-CM) - Pain in right knee ?M25.562 (ICD-10-CM) - Pain in left knee ?  ?THERAPY DIAG:  ?Chronic pain of left knee ?  ?Muscle weakness (generalized) ?  ?Chronic pain of right knee ?  ?Chronic right shoulder pain ?  ?ONSET DATE:  ? ?  ?SUBJECTIVE:  ?  ?SUBJECTIVE STATEMENT: ?11/19/2021 ?  States she is a little sore today. ? ?Eval: States she would like to be able to move her right knee better  and she has left knee pain. States that she has to crawl up and down her stairs and she has been gardening but she does use a grabber sometimes. States that she tries to bend her knees while sitting in her recliner. States she was going to therapy after her manipulation but it got expensive. States that she had some minor relief with her recent knee ablation last year but the pain came back. Goes to [employer] and wants to get back to the pool. Patient wants to avoid surgery on her left knee as she did not do well with her right knee surgery. ?  ?PERTINENT HISTORY: ?osteopenia, history of bronchitis and asthma, 08/12/20 right knee ablation, multiple knee injections, history of tka R 12/17 with manipulation on 3/18, hard of hearing, ?  ?PAIN:  ?Are you having pain? Yes: NPRS scale: 4/10 ?Pain location: in right  knees  ?Pain description: sore   ?Aggravating factors: standing, stairs, bending,  ?Relieving factors: rest ?  ?PRECAUTIONS: Fall ?  ?WEIGHT BEARING RESTRICTIONS No ?  ?FALLS:  ?Has patient fallen in last 6 months? Yes. Number of falls 1  walking in garden and leaning forward ?  ?LIVING ENVIRONMENT: ?Lives with: lives alone and nearby friend ?Lives in: House/apartment ?Stairs: Yes: Internal: 10 steps; on left going up and External: 4 steps; on right going up states she doesn't have to go up the stairs in the house ?Has following equipment at home: Single point cane uses occasionally, also has quad cane, grabber and sock donner ?  ?OCCUPATION: not currently working ?  ?  ?PLOF: Independent ?  ?PATIENT GOALS to move your right knee more and have less pain in left knee ?  ?  ?OBJECTIVE:  ?  ?DIAGNOSTIC FINDINGS: no recent imaging ?  ?  ?COGNITION: ?          Overall cognitive status: Within functional limits for tasks assessed               ?           ?SENSATION: ?WFL ?  ?  ?POSTURE:  ?Slumped posture, limited hip and knee extension ?  ?PALPATION: ?Tenderness to palpation along right knee muscles ?  ?  ?  ?           LE Measurements ?      ?Lower Extremity Right ?10/26/2021 Left ?10/26/2021  ?  A/PROM MMT A/PROM MMT  ?Hip Flexion   4+   4  ?Hip Extension          ?Hip Abduction          ?Hip Adduction          ?Hip Internal rotation          ?Hip External rotation          ?Knee Flexion 74* 3+ 122* 4  ?Knee Extension Lacking 10 4* Lacking 10* 4  ?Ankle Dorsiflexion          ?Ankle Plantarflexion          ?Ankle Inversion          ?Ankle Eversion          ? (Blank rows = not tested) ?           * pain ?  ? Right Knee AROM/PROM ?Date Flexion   Extension  ?10/26/21  74  lacking 10  ?11/01/21 80 Lacking 10   ?11/04/21 78 Lacking 10  ?11/17/21  82  Lacking 8  ?      ?      ?      ? ?  FUNCTIONAL TESTS:  ?STS: kicks out right leg, uncontrolled descent, uses UE to assist ?Stairs 6" height - step to with unilateral railing going up, step to with bilateral railing  coming down ?  ?GAIT: ?Distance walked: 25 feet ?Assistive device utilized: Single point cane ?Level of assistance: SBA ?Comments: limited hip and knee motion, bent forward,  ?  ?  ?  ?TODAY'S TREATMENT: ?11/19/2021 ?Therapeutic Exercise: ?  ? ? Seated: knee flexion stretch supine with box x5 1 minute each, hip/knee/back flexion stretch with box and strap x15 15-20 second holds, R LAQ 3x5 10" holds, piriformis stretch with strap x4 30" holds bilateral ? ?    Standing: kneside step up 2 UE assist 6" step 2x10 bilateral ?  ?Neuromuscular Re-education: ?Manual Therapy:   ? ? ?Previous Interventions:  ?Therapeutic Activity: STS from elevated surface - 10 minutes cues to bend knees, slow descent tactile support 22.0 inches - pauses with decent ? Seated: LAQ 1x15 5" holds R, AAROM R knee flexion x15 5" holds,  ? ?CRYOTHERAPY- To both knees elevated knee end of session 5 minutes end of session - educated during this time ?  ?  ?PATIENT EDUCATION:  ?Education details: HEP on ways to perform exercises at home.  ?Person educated: Patient ?Education method: Explanation, Demonstration, and Handouts ?Education comprehension: verbalized understanding ?  ?  ?  ?HOME EXERCISE PROGRAM: ?[id number] ?  ?ASSESSMENT: ?  ?CLINICAL IMPRESSION: ?11/19/2021 ? Continued focus on knee ROM which was tolerated moderately well. Added lumbar and hip motions which were also restricted and stretches were difficult. Minor pain noted with prolonged stretches but this reduced when patient performed opposite motion. Added hip stretches to HEP. ? ?Eval: Patient is a 76 y.o. female who was seen today for physical therapy evaluation and treatment for bilateral knee pain, and right knee ROM limitations. Patient presents with extensive right knee history with history of R TKA in 2017, right knee manipulation in 2018 and right knee nerve ablation in 2022. Given right knee history, patient's overall outcomes are guarded.Discussed extensively the work required to  anticipate change and goal being to achieve about the motion she had around the time of her manipulation (last documented knee flexion measurement at 95 degrees). Patient would benefit from skilled PT to

## 2021-11-23 ENCOUNTER — Ambulatory Visit: Payer: Medicare HMO | Admitting: Physical Therapy

## 2021-11-23 ENCOUNTER — Encounter: Payer: Self-pay | Admitting: Physical Therapy

## 2021-11-23 DIAGNOSIS — G8929 Other chronic pain: Secondary | ICD-10-CM | POA: Diagnosis not present

## 2021-11-23 DIAGNOSIS — M25561 Pain in right knee: Secondary | ICD-10-CM | POA: Diagnosis not present

## 2021-11-23 DIAGNOSIS — M25562 Pain in left knee: Secondary | ICD-10-CM

## 2021-11-23 DIAGNOSIS — M6281 Muscle weakness (generalized): Secondary | ICD-10-CM

## 2021-11-23 NOTE — Therapy (Signed)
?OUTPATIENT PHYSICAL THERAPY TREATMENT NOTE and Progress Note ? ?Progress Note ?Reporting Period 10/26/21 to 11/23/21 ? ?See note below for Objective Data and Assessment of Progress/Goals.  ? ?  ? ?Patient Name: Aimee Lawrence ?MRN: [id number] ?DOB:10/16/1945, 76 y.o., female ?Today's Date: 11/11/2021 ? ?PCP: [name], MD ?REFERRING PROVIDER: [name], MD ? ?END OF SESSION:  ? PT End of Session - 11/23/21 1021   ? ? Visit Number 8   ? Number of Visits 12   ? Date for PT Re-Evaluation 12/07/21   ? Authorization Type humana -auth approved 12 visists from 10/26/21 to 12/09/21   ? Authorization - Visit Number 8   ? Authorization - Number of Visits 12   ? Progress Note Due on Visit 10   ? PT Start Time 1021   ? PT Stop Time 1059   ? PT Time Calculation (min) 38 min   ? Activity Tolerance Patient tolerated treatment well   ? Behavior During Therapy [facility] for tasks assessed/performed   ? ?  ?  ? ?  ? ? ? ?Past Medical History:  ?Diagnosis Date  ? Allergy   ? takes Doxycycline daily and uses Flonase daily  ? Arthritis   ? Asthma   ? Albuterol inhaler as needed.  ? Dyspnea   ? rarely and sitting  ? GERD (gastroesophageal reflux disease)   ? not on any meds  ? History of bronchitis   ? History of shingles   ? Hx of colonic polyps   ? benign  ? Joint pain   ? Joint swelling   ? Osteopenia   ? takes Vit D3 and Calcium daily  ? Primary localized osteoarthritis of right knee   ? ?Past Surgical History:  ?Procedure Laterality Date  ? APPENDECTOMY    ? CHOLECYSTECTOMY  2007  ? lap choli  ? COLONOSCOPY  10/22/2015  ? per Dr. Carlean Purl, no polyps, repeat in 5 yrs   ? KNEE CLOSED REDUCTION Right 09/14/2016  ? Procedure: CLOSED MANIPULATION KNEE;  Surgeon: Melrose Nakayama, MD;  Location: [place];  Service: Orthopedics;  Laterality: Right;  ? NASAL POLYP SURGERY  2007  ? SINUS ENDO W/FUSION  06/27/2012  ? Procedure: ENDOSCOPIC SINUS SURGERY WITH FUSION NAVIGATION;  Surgeon: Rozetta Nunnery, MD;  Location: [place];  Service: ENT;  Laterality: N/A;  FESS FUSION PROTOCOL  ? TOTAL KNEE ARTHROPLASTY Right 06/15/2016  ? Procedure: RIGHT TOTAL KNEE ARTHROPLASTY;  Surgeon: Melrose Nakayama, MD;  Location: [place];  Service: Orthopedics;  Laterality: Right;  ? ?Patient Active Problem List  ? Diagnosis Date Noted  ? Abnormal finding on imaging 08/29/2017  ? Right hip pain 08/15/2017  ? S/P TKR (total knee replacement), right 08/11/2017  ? Osteoarthritis of hip 08/11/2017  ? Primary localized osteoarthritis of right knee 06/15/2016  ? Primary osteoarthritis of right knee 06/15/2016  ? Nasal polyps 11/20/2012  ? Essential hypertension 05/28/2010  ? DEPRESSION 02/28/2007  ? Asthma 02/28/2007  ? Osteopenia 02/28/2007  ? Personal hx colon polyps 02/28/2007  ? ? ?PCP: [name], MD ?  ?REFERRING PROVIDER: [name], DO ?  ?REFERRING DIAG: M25.561 (ICD-10-CM) - Pain in right knee ?M25.562 (ICD-10-CM) - Pain in left knee ?  ?THERAPY DIAG:  ?Chronic pain of left knee ?  ?Muscle weakness (generalized) ?  ?Chronic pain of right knee ?  ?Chronic right shoulder pain ?  ?ONSET DATE:  ? ?  ?SUBJECTIVE:  ?  ?SUBJECTIVE STATEMENT: ?  11/23/2021 ?  States she is a little sore today but she is always a little sore. States that she feels like she is 70% better.  States she feels movement is a little easier than before. States she does something every day. ? ?Eval: States she would like to be able to move her right knee better and she has left knee pain. States that she has to crawl up and down her stairs and she has been gardening but she does use a grabber sometimes. States that she tries to bend her knees while sitting in her recliner. States she was going to therapy after her manipulation but it got expensive. States that she had some minor relief with her recent knee ablation last year but the pain came back. Goes to [employer] and wants to get back to the pool. Patient wants to avoid surgery on her left knee as she did not do well with her  right knee surgery. ?  ?PERTINENT HISTORY: ?osteopenia, history of bronchitis and asthma, 08/12/20 right knee ablation, multiple knee injections, history of tka R 12/17 with manipulation on 3/18, hard of hearing, ?  ?PAIN:  ?Are you having pain? Yes: NPRS scale: 4/10 ?Pain location: in right  knees  ?Pain description: sore  ?Aggravating factors: standing, stairs, bending,  ?Relieving factors: rest ? ?OBJECTIVE:  ?   ?  ?  ?           LE Measurements  ? Right Knee AROM/PROM ?Date Flexion   Extension  ?10/26/21  74  lacking 10  ?11/01/21 80 Lacking 10   ?11/04/21 78 Lacking 10  ?11/17/21  82  Lacking 8  ?11/23/21  85   Lacking 8  ?      ?      ? ?FUNCTIONAL TESTS:  ?STS: kicks out right leg, uncontrolled descent, uses UE to assist ?Stairs 6" height - alternating step pattern to with unilateral railing going up, alternating step pattern with bilateral railing coming down and rotating body with left leading ?  ?  ?  ?TODAY'S TREATMENT: ?11/23/2021 ?Therapeutic Exercise: ? Prone: knee flexion stretch x3 1 minute right, strap, PROM of hip and knee right, 5 minutes, lumbar extension x10 10" holds ?Supine: knee flexion with strap x4 60 sec R strap ?Standing: steps alternating with B UE support x15 laps of 4 steps ? Seated:STS elevated surface x15 slow and controlled  ?  ?Neuromuscular Re-education: ?Manual Therapy:   ? ? ?Previous Interventions:  ?Therapeutic Activity: STS from elevated surface - 10 minutes cues to bend knees, slow descent tactile support 22.0 inches - pauses with decent ? Seated: LAQ 1x15 5" holds R, AAROM R knee flexion x15 5" holds,  ? ?  ?  ?PATIENT EDUCATION:  ?Education details: HEP on progress made, on POC  ?Person educated: Patient ?Education method: Explanation, Demonstration, and Handouts ?Education comprehension: verbalized understanding ?  ?  ?  ?HOME EXERCISE PROGRAM: ?[id number] ?  ?ASSESSMENT: ?  ?CLINICAL IMPRESSION: ?11/23/2021 ?Patient is progressing towards goals. She has met 2/3 short term goals and  is working towards long term goals. Overall patient is improving in ROM and function. Discussed importance of continued stretching and plan to discharge at end of POC pending patient presentation. Will continue with current POC as tolerated.  ? ?Eval: Patient is a 76 y.o. female who was seen today for physical therapy evaluation and treatment for bilateral knee pain, and right knee ROM limitations. Patient presents with extensive right knee history with history of R  TKA in 2017, right knee manipulation in 2018 and right knee nerve ablation in 2022. Given right knee history, patient's overall outcomes are guarded.Discussed extensively the work required to anticipate change and goal being to achieve about the motion she had around the time of her manipulation (last documented knee flexion measurement at 95 degrees). Patient would benefit from skilled PT to improve overall right knee function and reduce pain in left knee to avoid need for additional surgeries on left knee.  ?  ?  ?OBJECTIVE IMPAIRMENTS Abnormal gait, decreased activity tolerance, decreased balance, decreased knowledge of use of DME, decreased mobility, difficulty walking, decreased ROM, decreased strength, increased edema, and pain.  ?  ?ACTIVITY LIMITATIONS cleaning, community activity, meal prep, and yard work.  ?  ?PERSONAL FACTORS Age, Fitness, Time since onset of injury/illness/exacerbation, and 3+ comorbidities: R TKA, R knee manipulation, R knee nerve ablation, osteopenia  are also affecting patient's functional outcome.  ?  ?  ?REHAB POTENTIAL: Fair given right knee history and time since manipulation ?  ?CLINICAL DECISION MAKING: Evolving/moderate complexity ?  ?EVALUATION COMPLEXITY: Moderate ?  ?  ?GOALS: ?Goals reviewed with patient?  yes ?  ?SHORT TERM GOALS: ?  ?Patient will be independent in self management strategies to improve quality of life and functional outcomes. ?Baseline: new program ?Target date: 11/16/2021 ?Goal status: MET ?   ?2.  Patient will report at least 50% improvement in overall symptoms and/or function to demonstrate improved functional mobility ?Baseline: 0% ?Target date: 11/16/2021 ?Goal status: MET ?  ?3.  Patient will

## 2021-11-24 DIAGNOSIS — M25561 Pain in right knee: Secondary | ICD-10-CM | POA: Diagnosis not present

## 2021-11-24 DIAGNOSIS — M170 Bilateral primary osteoarthritis of knee: Secondary | ICD-10-CM | POA: Diagnosis not present

## 2021-11-24 DIAGNOSIS — M25562 Pain in left knee: Secondary | ICD-10-CM | POA: Diagnosis not present

## 2021-11-25 ENCOUNTER — Ambulatory Visit: Payer: Medicare HMO | Admitting: Physical Therapy

## 2021-11-25 ENCOUNTER — Encounter: Payer: Self-pay | Admitting: Physical Therapy

## 2021-11-25 DIAGNOSIS — M25562 Pain in left knee: Secondary | ICD-10-CM | POA: Diagnosis not present

## 2021-11-25 DIAGNOSIS — M6281 Muscle weakness (generalized): Secondary | ICD-10-CM

## 2021-11-25 DIAGNOSIS — M25561 Pain in right knee: Secondary | ICD-10-CM

## 2021-11-25 DIAGNOSIS — G8929 Other chronic pain: Secondary | ICD-10-CM | POA: Diagnosis not present

## 2021-11-25 NOTE — Therapy (Signed)
?OUTPATIENT PHYSICAL THERAPY TREATMENT NOTE  ? ?  ? ?Patient Name: Aimee Lawrence ?MRN: [id number] ?DOB:Apr 12, 1946, 76 y.o., female ?Today's Date: 11/11/2021 ? ?PCP: [name], MD ?REFERRING PROVIDER: [name], MD ? ?END OF SESSION:  ? PT End of Session - 11/25/21 1020   ? ? Visit Number 9   ? Number of Visits 12   ? Date for PT Re-Evaluation 12/07/21   ? Authorization Type humana -auth approved 12 visists from 10/26/21 to 12/09/21   ? Authorization - Visit Number 9   ? Authorization - Number of Visits 12   ? Progress Note Due on Visit 18   ? PT Start Time 1020   late to apt  ? PT Stop Time 1058   ? PT Time Calculation (min) 38 min   ? Activity Tolerance Patient tolerated treatment well   ? Behavior During Therapy [facility] for tasks assessed/performed   ? ?  ?  ? ?  ? ? ? ?Past Medical History:  ?Diagnosis Date  ? Allergy   ? takes Doxycycline daily and uses Flonase daily  ? Arthritis   ? Asthma   ? Albuterol inhaler as needed.  ? Dyspnea   ? rarely and sitting  ? GERD (gastroesophageal reflux disease)   ? not on any meds  ? History of bronchitis   ? History of shingles   ? Hx of colonic polyps   ? benign  ? Joint pain   ? Joint swelling   ? Osteopenia   ? takes Vit D3 and Calcium daily  ? Primary localized osteoarthritis of right knee   ? ?Past Surgical History:  ?Procedure Laterality Date  ? APPENDECTOMY    ? CHOLECYSTECTOMY  2007  ? lap choli  ? COLONOSCOPY  10/22/2015  ? per Dr. Carlean Purl, no polyps, repeat in 5 yrs   ? KNEE CLOSED REDUCTION Right 09/14/2016  ? Procedure: CLOSED MANIPULATION KNEE;  Surgeon: Melrose Nakayama, MD;  Location: [place];  Service: Orthopedics;  Laterality: Right;  ? NASAL POLYP SURGERY  2007  ? SINUS ENDO W/FUSION  06/27/2012  ? Procedure: ENDOSCOPIC SINUS SURGERY WITH FUSION NAVIGATION;  Surgeon: Rozetta Nunnery, MD;  Location: [place];  Service: ENT;  Laterality: N/A;  FESS FUSION PROTOCOL  ? TOTAL KNEE ARTHROPLASTY Right 06/15/2016  ? Procedure: RIGHT TOTAL  KNEE ARTHROPLASTY;  Surgeon: Melrose Nakayama, MD;  Location: [place];  Service: Orthopedics;  Laterality: Right;  ? ?Patient Active Problem List  ? Diagnosis Date Noted  ? Abnormal finding on imaging 08/29/2017  ? Right hip pain 08/15/2017  ? S/P TKR (total knee replacement), right 08/11/2017  ? Osteoarthritis of hip 08/11/2017  ? Primary localized osteoarthritis of right knee 06/15/2016  ? Primary osteoarthritis of right knee 06/15/2016  ? Nasal polyps 11/20/2012  ? Essential hypertension 05/28/2010  ? DEPRESSION 02/28/2007  ? Asthma 02/28/2007  ? Osteopenia 02/28/2007  ? Personal hx colon polyps 02/28/2007  ? ? ?PCP: [name], MD ?  ?REFERRING PROVIDER: [name], DO ?  ?REFERRING DIAG: M25.561 (ICD-10-CM) - Pain in right knee ?M25.562 (ICD-10-CM) - Pain in left knee ?  ?THERAPY DIAG:  ?Chronic pain of left knee ?  ?Muscle weakness (generalized) ?  ?Chronic pain of right knee ?  ?Chronic right shoulder pain ?  ?ONSET DATE:  ? ?  ?SUBJECTIVE:  ?  ?SUBJECTIVE STATEMENT: ?11/25/2021 ? States saw MD who would like her to be needled. He used a percussion gun on  her and it felt alright but she has some bruising along the areas he used the gun on. States he would like her to buy a machine but they were a bit pricey. ? ?Eval: States she would like to be able to move her right knee better and she has left knee pain. States that she has to crawl up and down her stairs and she has been gardening but she does use a grabber sometimes. States that she tries to bend her knees while sitting in her recliner. States she was going to therapy after her manipulation but it got expensive. States that she had some minor relief with her recent knee ablation last year but the pain came back. Goes to [employer] and wants to get back to the pool. Patient wants to avoid surgery on her left knee as she did not do well with her right knee surgery. ?  ?PERTINENT HISTORY: ?osteopenia, history of bronchitis and asthma, 08/12/20 right knee  ablation, multiple knee injections, history of tka R 12/17 with manipulation on 3/18, hard of hearing, ?  ?PAIN:  ?Are you having pain? Yes: NPRS scale: 2/10 ?Pain location: in right  knees  ?Pain description: sore  ?Aggravating factors: standing, stairs, bending,  ?Relieving factors: rest ? ?OBJECTIVE:  ?   ?  ?  ?           LE Measurements  ? Right Knee AROM/PROM ?Date Flexion   Extension  ?10/26/21  74  lacking 10  ?11/01/21 80 Lacking 10   ?11/04/21 78 Lacking 10  ?11/17/21  82  Lacking 8  ?11/23/21  85   Lacking 8  ?11/25/21 pre/post DN 82/83  lacking 8  ?      ? ?FUNCTIONAL TESTS:  ?STS: kicks out right leg, uncontrolled descent, uses UE to assist ?Stairs 6" height - alternating step pattern to with unilateral railing going up, alternating step pattern with bilateral railing coming down and rotating body with left leading ?  ?  ?  ?TODAY'S TREATMENT: ?11/25/2021 ?Trigger Point Dry-Needling  ?Treatment instructions: Expect mild to moderate muscle soreness. S/S of pneumothorax if dry needled over a lung field, and to seek immediate medical attention should they occur. Patient verbalized understanding of these instructions and education. ? ?Patient Consent Given: Yes ?Education handout provided: No ?Muscles treated: bilateral vastous lateralis and medialis - pistoning throughout treatment ?Electrical stimulation performed: No ?Parameters: N/A ?Treatment response/outcome: no adverse reactions ? ?Therapeutic Exercise: ?Supine: knee flexion with strap x4 60 sec R strap ?Seated knee ROM 3 minutes R ? ?  ?Manual Therapy:  STM to bilateral quads with patient supine - tolerated well ? ? ?Previous Interventions:  ?Therapeutic Activity: STS from elevated surface - 10 minutes cues to bend knees, slow descent tactile support 22.0 inches - pauses with decent ? Seated: LAQ 1x15 5" holds R, AAROM R knee flexion x15 5" holds,  ? ?  ?  ?PATIENT EDUCATION:  ?Education details: on DN, on percussion gun types/models and where to search for  them as well as benefits, on aquatic therapy and aquatic fitness classes at current gym ?Person educated: Patient ?Education method: Explanation, Demonstration, and Handouts ?Education comprehension: verbalized understanding ?  ?  ?  ?HOME EXERCISE PROGRAM: ?[id number] ?  ?ASSESSMENT: ?  ?CLINICAL IMPRESSION: ?11/25/2021 ?Session focused on dry needling, soft tissue mobilization and education on aquatic therapy interventions secondary to patient reporting MD would like these interventions to be performed during  PT. Patient with no immediate response to these interventions  but educated patient on typical response with STM and dry needling. Educated patient on where to find percussion gun within her price range and discussed trailing aquatic fitness classes as she has immediate access to these now with current gym membership. Patient with goal of participating in these classes but classes were previously limited in times they were offered so she could not attend, now classes are more readily available and patient interested in participating in them. Discussed plan moving forward and patient would like to focus on mobility work as she felt that has been most beneficial to her at this time. Will continue with current Plan and perform manual interventions as indicated or requested by patient.  ? ?Eval: Patient is a 76 y.o. female who was seen today for physical therapy evaluation and treatment for bilateral knee pain, and right knee ROM limitations. Patient presents with extensive right knee history with history of R TKA in 2017, right knee manipulation in 2018 and right knee nerve ablation in 2022. Given right knee history, patient's overall outcomes are guarded.Discussed extensively the work required to anticipate change and goal being to achieve about the motion she had around the time of her manipulation (last documented knee flexion measurement at 95 degrees). Patient would benefit from skilled PT to improve overall  right knee function and reduce pain in left knee to avoid need for additional surgeries on left knee.  ?  ?  ?OBJECTIVE IMPAIRMENTS Abnormal gait, decreased activity tolerance, decreased balance, decrease

## 2021-11-30 ENCOUNTER — Ambulatory Visit: Payer: Medicare HMO | Admitting: Physical Therapy

## 2021-11-30 ENCOUNTER — Encounter: Payer: Self-pay | Admitting: Physical Therapy

## 2021-11-30 DIAGNOSIS — M6281 Muscle weakness (generalized): Secondary | ICD-10-CM

## 2021-11-30 DIAGNOSIS — M25561 Pain in right knee: Secondary | ICD-10-CM

## 2021-11-30 DIAGNOSIS — M25562 Pain in left knee: Secondary | ICD-10-CM | POA: Diagnosis not present

## 2021-11-30 DIAGNOSIS — G8929 Other chronic pain: Secondary | ICD-10-CM | POA: Diagnosis not present

## 2021-11-30 NOTE — Therapy (Signed)
OUTPATIENT PHYSICAL THERAPY TREATMENT NOTE      Patient Name: Aimee Lawrence MRN: [id number] DOB:[birth year], 76 y.o., female Today's Date: 11/11/2021  PCP: [name], MD REFERRING PROVIDER: [name], MD  END OF SESSION:   PT End of Session - 11/30/21 1019     Visit Number 10    Number of Visits 12    Date for PT Re-Evaluation 12/07/21    Authorization Type humana -auth approved 12 visists from 10/26/21 to 12/09/21    Authorization - Visit Number 10    Authorization - Number of Visits 12    Progress Note Due on Visit 18    PT Start Time 1016    PT Stop Time 1056    PT Time Calculation (min) 40 min    Activity Tolerance Patient tolerated treatment well    Behavior During Therapy WFL for tasks assessed/performed              Past Medical History:  Diagnosis Date   Allergy    takes Doxycycline daily and uses Flonase daily   Arthritis    Asthma    Albuterol inhaler as needed.   Dyspnea    rarely and sitting   GERD (gastroesophageal reflux disease)    not on any meds   History of bronchitis    History of shingles    Hx of colonic polyps    benign   Joint pain    Joint swelling    Osteopenia    takes Vit D3 and Calcium daily   Primary localized osteoarthritis of right knee    Past Surgical History:  Procedure Laterality Date   APPENDECTOMY     CHOLECYSTECTOMY  2007   lap choli   COLONOSCOPY  10/22/2015   per Dr. Carlean Purl, no polyps, repeat in 5 yrs    KNEE CLOSED REDUCTION Right 09/14/2016   Procedure: CLOSED MANIPULATION KNEE;  Surgeon: Melrose Nakayama, MD;  Location: [place];  Service: Orthopedics;  Laterality: Right;   NASAL POLYP SURGERY  2007   SINUS ENDO W/FUSION  06/27/2012   Procedure: ENDOSCOPIC SINUS SURGERY WITH FUSION NAVIGATION;  Surgeon: Rozetta Nunnery, MD;  Location: [place];  Service: ENT;  Laterality: N/A;  FESS FUSION PROTOCOL   TOTAL KNEE ARTHROPLASTY Right 06/15/2016   Procedure: RIGHT TOTAL KNEE  ARTHROPLASTY;  Surgeon: Melrose Nakayama, MD;  Location: [place];  Service: Orthopedics;  Laterality: Right;   Patient Active Problem List   Diagnosis Date Noted   Abnormal finding on imaging 08/29/2017   Right hip pain 08/15/2017   S/P TKR (total knee replacement), right 08/11/2017   Osteoarthritis of hip 08/11/2017   Primary localized osteoarthritis of right knee 06/15/2016   Primary osteoarthritis of right knee 06/15/2016   Nasal polyps 11/20/2012   Essential hypertension 05/28/2010   DEPRESSION 02/28/2007   Asthma 02/28/2007   Osteopenia 02/28/2007   Personal hx colon polyps 02/28/2007    PCP: [name], MD   REFERRING PROVIDER: [name], DO   REFERRING DIAG: L38.101 (ICD-10-CM) - Pain in right knee M25.562 (ICD-10-CM) - Pain in left knee   THERAPY DIAG:  Chronic pain of left knee   Muscle weakness (generalized)   Chronic pain of right knee   Chronic right shoulder pain   ONSET DATE:     SUBJECTIVE:    SUBJECTIVE STATEMENT: 11/30/2021 States she went to the pool on Thursday and is going again today. States that she is going again today. .States  she got a mat for her lower step to practice her exercise at home.  Eval: States she would like to be able to move her right knee better and she has left knee pain. States that she has to crawl up and down her stairs and she has been gardening but she does use a grabber sometimes. States that she tries to bend her knees while sitting in her recliner. States she was going to therapy after her manipulation but it got expensive. States that she had some minor relief with her recent knee ablation last year but the pain came back. Goes to [employer] and wants to get back to the pool. Patient wants to avoid surgery on her left knee as she did not do well with her right knee surgery.   PERTINENT HISTORY: osteopenia, history of bronchitis and asthma, 08/12/20 right knee ablation, multiple knee injections, history of tka R 12/17  with manipulation on 3/18, hard of hearing,   PAIN:  Are you having pain? Yes: NPRS scale: 2/10 Pain location: in right  knees  Pain description: sore  Aggravating factors: standing, stairs, bending,  Relieving factors: rest  OBJECTIVE:                    LE Measurements   Right Knee AROM/PROM Date Flexion   Extension  10/26/21  74  lacking 10  11/01/21 80 Lacking 10   11/04/21 78 Lacking 10  11/17/21  82  Lacking 8  11/23/21  85   Lacking 8  11/25/21 pre/post DN 82/83  lacking 8         FUNCTIONAL TESTS:  STS: kicks out right leg, uncontrolled descent, uses UE to assist Stairs 6" height - alternating step pattern to with unilateral railing going up, alternating step pattern with bilateral railing coming down and rotating body with left leading       TODAY'S TREATMENT: 11/30/2021 Therapeutic Exercise: Supine: knee flexion at wall x5 60" holds bilateral and extension x5 60" holds, Marching on wall 3x10 bilateral, bent knee fall outs feet on wall 3x10 Bilateral 5" holds   Previous Interventions:  Therapeutic Activity: STS from elevated surface - 10 minutes cues to bend knees, slow descent tactile support 22.0 inches - pauses with decent  Seated: LAQ 1x15 5" holds R, AAROM R knee flexion x15 5" holds,       PATIENT EDUCATION:  Education details: on water exercises on HEP.  Person educated: Patient Education method: Explanation, Demonstration, and Handouts Education comprehension: verbalized understanding       HOME EXERCISE PROGRAM: [id number]   ASSESSMENT:   CLINICAL IMPRESSION: 11/30/2021 Continued to progress exercise as tolerated. Added all new exercises to HEP. Tolerated moderately well but soreness noted afterwards. Encouraged patent to continue to go to pool at her gym. Will continue with current POC As tolerated.   Eval: Patient is a 77 y.o. female who was seen today for physical therapy evaluation and treatment for bilateral knee pain, and right knee ROM  limitations. Patient presents with extensive right knee history with history of R TKA in 2017, right knee manipulation in 2018 and right knee nerve ablation in 2022. Given right knee history, patient's overall outcomes are guarded.Discussed extensively the work required to anticipate change and goal being to achieve about the motion she had around the time of her manipulation (last documented knee flexion measurement at 95 degrees). Patient would benefit from skilled PT to improve overall right knee function and reduce pain in left knee  to avoid need for additional surgeries on left knee.      OBJECTIVE IMPAIRMENTS Abnormal gait, decreased activity tolerance, decreased balance, decreased knowledge of use of DME, decreased mobility, difficulty walking, decreased ROM, decreased strength, increased edema, and pain.    ACTIVITY LIMITATIONS cleaning, community activity, meal prep, and yard work.    PERSONAL FACTORS Age, Fitness, Time since onset of injury/illness/exacerbation, and 3+ comorbidities: R TKA, R knee manipulation, R knee nerve ablation, osteopenia  are also affecting patient's functional outcome.      REHAB POTENTIAL: Fair given right knee history and time since manipulation   CLINICAL DECISION MAKING: Evolving/moderate complexity   EVALUATION COMPLEXITY: Moderate     GOALS: Goals reviewed with patient?  yes   SHORT TERM GOALS:   Patient will be independent in self management strategies to improve quality of life and functional outcomes. Baseline: new program Target date: 11/16/2021 Goal status: MET   2.  Patient will report at least 50% improvement in overall symptoms and/or function to demonstrate improved functional mobility Baseline: 0% Target date: 11/16/2021 Goal status: MET   3.  Patient will demonstrate at least 90 degree of right knee flexion  Baseline: 74 degrees Target date: 11/16/2021 Goal status: PROGRESSING           LONG TERM GOALS:   Patient will report at  least 75% improvement in overall symptoms and/or function to demonstrate improved functional mobility Baseline: 0% Target date: 12/07/2021 Goal status: PROGRESSING   2.  Patient will be able to transition from sit to stand with use of one hand if needed but without kicking out right leg to improve ability to transition from STS. Baseline: kicks out leg Target date: 12/07/2021 Goal status: PROGRESSING   3.  Patient will be able walk up the steps with reciprocating gait going up to improve ability to go up stairs at home. Baseline: crawls up stairs/step to gait pattern Target date: 12/07/2021 Goal status: PROGRESSING- able to perform in clinic but hasn't performed at home       PLAN: PT FREQUENCY: 2x/week   PT DURATION: 6 weeks   PLANNED INTERVENTIONS: Therapeutic exercises, Therapeutic activity, Neuromuscular re-education, Balance training, Gait training, Patient/Family education, Joint mobilization, Stair training, Visual/preceptual remediation/compensation, Dry Needling, Electrical stimulation, Spinal mobilization, Cryotherapy, Moist heat, Traction, Ultrasound, and Manual therapy   PLAN FOR NEXT SESSION: wall slides, hip IR/ER supine, DKC on ball, bridge on wall, functional knee/hip/lumbar ROM - STS elevated surface, knee ROM and stretches, LE strengthening   10:56 AM, 11/30/21 Jerene Pitch, DPT Physical Therapy with [facility]

## 2021-12-02 ENCOUNTER — Ambulatory Visit: Payer: Medicare HMO | Admitting: Physical Therapy

## 2021-12-02 ENCOUNTER — Encounter: Payer: Self-pay | Admitting: Physical Therapy

## 2021-12-02 DIAGNOSIS — M25561 Pain in right knee: Secondary | ICD-10-CM | POA: Diagnosis not present

## 2021-12-02 DIAGNOSIS — M6281 Muscle weakness (generalized): Secondary | ICD-10-CM

## 2021-12-02 DIAGNOSIS — G8929 Other chronic pain: Secondary | ICD-10-CM | POA: Diagnosis not present

## 2021-12-02 DIAGNOSIS — M25562 Pain in left knee: Secondary | ICD-10-CM | POA: Diagnosis not present

## 2021-12-02 NOTE — Therapy (Signed)
OUTPATIENT PHYSICAL THERAPY TREATMENT NOTE      Patient Name: Aimee Lawrence MRN: [id number] DOB:February 03, 1946, 76 y.o., female Today's Date: 11/11/2021  PCP: [name], MD REFERRING PROVIDER: [name], MD  END OF SESSION:   PT End of Session - 12/02/21 1018     Visit Number 11    Number of Visits 12    Date for PT Re-Evaluation 12/07/21    Authorization Type humana -auth approved 12 visists from 10/26/21 to 12/09/21    Authorization - Visit Number 11    Authorization - Number of Visits 12    Progress Note Due on Visit 18    PT Start Time 1018    PT Stop Time 1056    PT Time Calculation (min) 38 min    Activity Tolerance Patient tolerated treatment well    Behavior During Therapy WFL for tasks assessed/performed              Past Medical History:  Diagnosis Date   Allergy    takes Doxycycline daily and uses Flonase daily   Arthritis    Asthma    Albuterol inhaler as needed.   Dyspnea    rarely and sitting   GERD (gastroesophageal reflux disease)    not on any meds   History of bronchitis    History of shingles    Hx of colonic polyps    benign   Joint pain    Joint swelling    Osteopenia    takes Vit D3 and Calcium daily   Primary localized osteoarthritis of right knee    Past Surgical History:  Procedure Laterality Date   APPENDECTOMY     CHOLECYSTECTOMY  2007   lap choli   COLONOSCOPY  10/22/2015   per Dr. Carlean Purl, no polyps, repeat in 5 yrs    KNEE CLOSED REDUCTION Right 09/14/2016   Procedure: CLOSED MANIPULATION KNEE;  Surgeon: Melrose Nakayama, MD;  Location: [facility];  Service: Orthopedics;  Laterality: Right;   NASAL POLYP SURGERY  2007   SINUS ENDO W/FUSION  06/27/2012   Procedure: ENDOSCOPIC SINUS SURGERY WITH FUSION NAVIGATION;  Surgeon: Rozetta Nunnery, MD;  Location: [facility];  Service: ENT;  Laterality: N/A;  FESS FUSION PROTOCOL   TOTAL KNEE ARTHROPLASTY Right 06/15/2016   Procedure: RIGHT TOTAL KNEE  ARTHROPLASTY;  Surgeon: Melrose Nakayama, MD;  Location: [facility];  Service: Orthopedics;  Laterality: Right;   Patient Active Problem List   Diagnosis Date Noted   Abnormal finding on imaging 08/29/2017   Right hip pain 08/15/2017   S/P TKR (total knee replacement), right 08/11/2017   Osteoarthritis of hip 08/11/2017   Primary localized osteoarthritis of right knee 06/15/2016   Primary osteoarthritis of right knee 06/15/2016   Nasal polyps 11/20/2012   Essential hypertension 05/28/2010   DEPRESSION 02/28/2007   Asthma 02/28/2007   Osteopenia 02/28/2007   Personal hx colon polyps 02/28/2007    PCP: [name], MD   REFERRING PROVIDER: [name], DO   REFERRING DIAG: C48.889 (ICD-10-CM) - Pain in right knee M25.562 (ICD-10-CM) - Pain in left knee   THERAPY DIAG:  Chronic pain of left knee   Muscle weakness (generalized)   Chronic pain of right knee   Chronic right shoulder pain   ONSET DATE:     SUBJECTIVE:    SUBJECTIVE STATEMENT: 12/02/2021 States she has some tightness as she has been sitting in a car for an hour and did not take her  medication this morning.  Eval: States she would like to be able to move her right knee better and she has left knee pain. States that she has to crawl up and down her stairs and she has been gardening but she does use a grabber sometimes. States that she tries to bend her knees while sitting in her recliner. States she was going to therapy after her manipulation but it got expensive. States that she had some minor relief with her recent knee ablation last year but the pain came back. Goes to [employer] and wants to get back to the pool. Patient wants to avoid surgery on her left knee as she did not do well with her right knee surgery.   PERTINENT HISTORY: osteopenia, history of bronchitis and asthma, 08/12/20 right knee ablation, multiple knee injections, history of tka R 12/17 with manipulation on 3/18, hard of hearing,   PAIN:  Are  you having pain? Yes: NPRS scale: 4/10 Pain location: in right  knees  Pain description: sore  Aggravating factors: standing, stairs, bending,  Relieving factors: rest  OBJECTIVE:                    LE Measurements   Right Knee AROM/PROM Date Flexion   Extension  10/26/21  74  lacking 10  11/01/21 80 Lacking 10   11/04/21 78 Lacking 10  11/17/21  82  Lacking 8  11/23/21  85   Lacking 8  11/25/21 pre/post DN 82/83  lacking 8  12/02/21  89  lacking 8       FUNCTIONAL TESTS:  STS: kicks out right leg, uncontrolled descent, uses UE to assist Stairs 6" height - alternating step pattern to with unilateral railing going up, alternating step pattern with bilateral railing coming down and rotating body with left leading       TODAY'S TREATMENT: 12/02/2021 Therapeutic Exercise: Seated: AAROM knee flexion x4  30" holds R, LAQS 10 5" holds R  Standing: foot taps on 6" step and hands on wall 2 minutes B Supine: KNEE FLEXION with strap x4 60" holds R , knee flexion at wall x5 60" holds bilateral and extension x5 60" holds, Marching on wall 3x10 bilateral, bent knee fall outs feet on wall 3x10 Bilateral 5" holds   Previous Interventions:  Therapeutic Activity: STS from elevated surface - 10 minutes cues to bend knees, slow descent tactile support 22.0 inches - pauses with decent  Seated: LAQ 1x15 5" holds R, AAROM R knee flexion x15 5" holds,       PATIENT EDUCATION:  Education details: on water exercises on HEP.  Person educated: Patient Education method: Explanation, Demonstration, and Handouts Education comprehension: verbalized understanding       HOME EXERCISE PROGRAM: [id number]   ASSESSMENT:   CLINICAL IMPRESSION: 12/02/2021 Patient continues to improve in knee ROM measuring 89 degrees of flexion on this date. Tolerated new exercise well and added to HEP. Overall patient is progressing well and would continue to benefit from skilled PT to work towards remaining goals.   Eval:  Patient is a 76 y.o. female who was seen today for physical therapy evaluation and treatment for bilateral knee pain, and right knee ROM limitations. Patient presents with extensive right knee history with history of R TKA in 2017, right knee manipulation in 2018 and right knee nerve ablation in 2022. Given right knee history, patient's overall outcomes are guarded.Discussed extensively the work required to anticipate change and goal being to achieve about the  motion she had around the time of her manipulation (last documented knee flexion measurement at 95 degrees). Patient would benefit from skilled PT to improve overall right knee function and reduce pain in left knee to avoid need for additional surgeries on left knee.      OBJECTIVE IMPAIRMENTS Abnormal gait, decreased activity tolerance, decreased balance, decreased knowledge of use of DME, decreased mobility, difficulty walking, decreased ROM, decreased strength, increased edema, and pain.    ACTIVITY LIMITATIONS cleaning, community activity, meal prep, and yard work.    PERSONAL FACTORS Age, Fitness, Time since onset of injury/illness/exacerbation, and 3+ comorbidities: R TKA, R knee manipulation, R knee nerve ablation, osteopenia  are also affecting patient's functional outcome.      REHAB POTENTIAL: Fair given right knee history and time since manipulation   CLINICAL DECISION MAKING: Evolving/moderate complexity   EVALUATION COMPLEXITY: Moderate     GOALS: Goals reviewed with patient?  yes   SHORT TERM GOALS:   Patient will be independent in self management strategies to improve quality of life and functional outcomes. Baseline: new program Target date: 11/16/2021 Goal status: MET   2.  Patient will report at least 50% improvement in overall symptoms and/or function to demonstrate improved functional mobility Baseline: 0% Target date: 11/16/2021 Goal status: MET   3.  Patient will demonstrate at least 90 degree of right knee  flexion  Baseline: 74 degrees Target date: 11/16/2021 Goal status: PROGRESSING           LONG TERM GOALS:   Patient will report at least 75% improvement in overall symptoms and/or function to demonstrate improved functional mobility Baseline: 0% Target date: 12/07/2021 Goal status: PROGRESSING   2.  Patient will be able to transition from sit to stand with use of one hand if needed but without kicking out right leg to improve ability to transition from STS. Baseline: kicks out leg Target date: 12/07/2021 Goal status: PROGRESSING   3.  Patient will be able walk up the steps with reciprocating gait going up to improve ability to go up stairs at home. Baseline: crawls up stairs/step to gait pattern Target date: 12/07/2021 Goal status: PROGRESSING- able to perform in clinic but hasn't performed at home       PLAN: PT FREQUENCY: 2x/week   PT DURATION: 6 weeks   PLANNED INTERVENTIONS: Therapeutic exercises, Therapeutic activity, Neuromuscular re-education, Balance training, Gait training, Patient/Family education, Joint mobilization, Stair training, Visual/preceptual remediation/compensation, Dry Needling, Electrical stimulation, Spinal mobilization, Cryotherapy, Moist heat, Traction, Ultrasound, and Manual therapy   PLAN FOR NEXT SESSION: wall slides,foot taps, chair pose - review HEP   11:02 AM, 12/02/21 Jerene Pitch, DPT Physical Therapy with [facility] Sinner

## 2021-12-04 DIAGNOSIS — J3081 Allergic rhinitis due to animal (cat) (dog) hair and dander: Secondary | ICD-10-CM | POA: Diagnosis not present

## 2021-12-04 DIAGNOSIS — J301 Allergic rhinitis due to pollen: Secondary | ICD-10-CM | POA: Diagnosis not present

## 2021-12-04 DIAGNOSIS — J3089 Other allergic rhinitis: Secondary | ICD-10-CM | POA: Diagnosis not present

## 2021-12-08 ENCOUNTER — Encounter: Payer: Medicare HMO | Admitting: Physical Therapy

## 2021-12-10 ENCOUNTER — Encounter: Payer: Self-pay | Admitting: Physical Therapy

## 2021-12-10 ENCOUNTER — Ambulatory Visit (INDEPENDENT_AMBULATORY_CARE_PROVIDER_SITE_OTHER): Payer: Medicare HMO | Admitting: Physical Therapy

## 2021-12-10 DIAGNOSIS — G8929 Other chronic pain: Secondary | ICD-10-CM

## 2021-12-10 DIAGNOSIS — M25562 Pain in left knee: Secondary | ICD-10-CM

## 2021-12-10 NOTE — Therapy (Signed)
OUTPATIENT PHYSICAL THERAPY TREATMENT NOTE and Discharge Note PHYSICAL THERAPY DISCHARGE SUMMARY  Visits from Start of Care: 12  Current functional level related to goals / functional outcomes: See below   Remaining deficits: See below   Education / Equipment: See below   Patient agrees to discharge. Patient goals were partially met. Patient is being discharged due to being pleased with the current functional level.     Patient Name: Aimee Lawrence MRN: [id number] DOB:1946/03/18, 76 y.o., female Today's Date: 11/11/2021  PCP: [name], MD REFERRING PROVIDER: [name], MD  END OF SESSION:   PT End of Session - 12/10/21 1019     Visit Number 12    Number of Visits 12    Date for PT Re-Evaluation 12/07/21    Authorization Type humana -auth approved 12 visists from 10/26/21 to 12/09/21 - [place] - Visit Number 1    Authorization - Number of Visits 1    Progress Note Due on Visit 18    PT Start Time 1019    PT Stop Time 1057    PT Time Calculation (min) 38 min    Activity Tolerance Patient tolerated treatment well    Behavior During Therapy WFL for tasks assessed/performed              Past Medical History:  Diagnosis Date   Allergy    takes Doxycycline daily and uses Flonase daily   Arthritis    Asthma    Albuterol inhaler as needed.   Dyspnea    rarely and sitting   GERD (gastroesophageal reflux disease)    not on any meds   History of bronchitis    History of shingles    Hx of colonic polyps    benign   Joint pain    Joint swelling    Osteopenia    takes Vit D3 and Calcium daily   Primary localized osteoarthritis of right knee    Past Surgical History:  Procedure Laterality Date   APPENDECTOMY     CHOLECYSTECTOMY  2007   lap choli   COLONOSCOPY  10/22/2015   per Dr. Carlean Purl, no polyps, repeat in 5 yrs    KNEE CLOSED REDUCTION Right 09/14/2016   Procedure: CLOSED MANIPULATION KNEE;  Surgeon: Melrose Nakayama, MD;  Location: [facility];  Service: Orthopedics;  Laterality: Right;   NASAL POLYP SURGERY  2007   SINUS ENDO W/FUSION  06/27/2012   Procedure: ENDOSCOPIC SINUS SURGERY WITH FUSION NAVIGATION;  Surgeon: Rozetta Nunnery, MD;  Location: [facility];  Service: ENT;  Laterality: N/A;  FESS FUSION PROTOCOL   TOTAL KNEE ARTHROPLASTY Right 06/15/2016   Procedure: RIGHT TOTAL KNEE ARTHROPLASTY;  Surgeon: Melrose Nakayama, MD;  Location: [facility];  Service: Orthopedics;  Laterality: Right;   Patient Active Problem List   Diagnosis Date Noted   Abnormal finding on imaging 08/29/2017   Right hip pain 08/15/2017   S/P TKR (total knee replacement), right 08/11/2017   Osteoarthritis of hip 08/11/2017   Primary localized osteoarthritis of right knee 06/15/2016   Primary osteoarthritis of right knee 06/15/2016   Nasal polyps 11/20/2012   Essential hypertension 05/28/2010   DEPRESSION 02/28/2007   Asthma 02/28/2007   Osteopenia 02/28/2007   Personal hx colon polyps 02/28/2007    PCP: [name], MD   REFERRING PROVIDER: [name], DO   REFERRING DIAG: [phone] (ICD-10-CM) - Pain in right knee M25.562 (ICD-10-CM) - Pain  in left knee   THERAPY DIAG:  Chronic pain of left knee   Muscle weakness (generalized)   Chronic pain of right knee   Chronic right shoulder pain   ONSET DATE:     SUBJECTIVE:    SUBJECTIVE STATEMENT: 12/10/2021   Reports that her pain is about the same. States that she walked up her stairs but needed to pull herself up the stairs and was a semi step to gait pattern. States that at the pool she was able to do it with bilateral railing.   Eval: States she would like to be able to move her right knee better and she has left knee pain. States that she has to crawl up and down her stairs and she has been gardening but she does use a grabber sometimes. States that she tries to bend her knees while sitting in her recliner. States she was going to  therapy after her manipulation but it got expensive. States that she had some minor relief with her recent knee ablation last year but the pain came back. Goes to [employer] and wants to get back to the pool. Patient wants to avoid surgery on her left knee as she did not do well with her right knee surgery.   PERTINENT HISTORY: osteopenia, history of bronchitis and asthma, 08/12/20 right knee ablation, multiple knee injections, history of tka R 12/17 with manipulation on 3/18, hard of hearing,   PAIN:  Are you having pain? Yes: NPRS scale: 4/10 Pain location: in right  knee  Pain description: sore  Aggravating factors: standing, stairs, bending,  Relieving factors: rest  OBJECTIVE:                    LE Measurements   Right Knee AROM/PROM Date Flexion   Extension  10/26/21  74  lacking 10  11/01/21 80 Lacking 10   11/04/21 78 Lacking 10  11/17/21  82  Lacking 8  11/23/21  85   Lacking 8  11/25/21 pre/post DN 82/83  lacking 8  12/02/21  89  lacking 8  12/10/2021 87 Lacking 10   FUNCTIONAL TESTS:  STS: kicks out right leg, uncontrolled descent, uses UE to assist Stairs 6" height - alternating step pattern to with unilateral railing going up, alternating step pattern with bilateral railing coming down and rotating body with left leading       TODAY'S TREATMENT: 12/10/2021 Therapeutic Exercise:  Seated: AAROM knee flexion x4  30" holds R,  Supine: KNEE FLEXION with strap x4 60" holds R , knee flexion at wall x5 60" holds bilateral and extension x5 60" holds,     PATIENT EDUCATION:  Education details: on water exercises on HEP. Progress, plan and focus moving forward Person educated: Patient Education method: Explanation, Demonstration, and Handouts Education comprehension: verbalized understanding       HOME EXERCISE PROGRAM: [id number]   ASSESSMENT:   CLINICAL IMPRESSION: 12/10/2021 Patient independent in aquatic routine with PT written HEP. Reviewed HEP and answered all questions.  Trailed percussion gun during POC and patient with home percussion gun bought to continue to work on this. Trailed dry needling in clinic with patient and patient desired to focus on mobility interventions to continue to develop HEP and independence in knee rehab management.  Educated on current presentation and goals met. Patient has returned to pool and performing all exercises in pool at this time.   Eval: Patient is a 76 y.o. female who was seen today for physical therapy evaluation  and treatment for bilateral knee pain, and right knee ROM limitations. Patient presents with extensive right knee history with history of R TKA in 2017, right knee manipulation in 2018 and right knee nerve ablation in 2022. Given right knee history, patient's overall outcomes are guarded.Discussed extensively the work required to anticipate change and goal being to achieve about the motion she had around the time of her manipulation (last documented knee flexion measurement at 95 degrees). Patient would benefit from skilled PT to improve overall right knee function and reduce pain in left knee to avoid need for additional surgeries on left knee.      OBJECTIVE IMPAIRMENTS Abnormal gait, decreased activity tolerance, decreased balance, decreased knowledge of use of DME, decreased mobility, difficulty walking, decreased ROM, decreased strength, increased edema, and pain.    ACTIVITY LIMITATIONS cleaning, community activity, meal prep, and yard work.    PERSONAL FACTORS Age, Fitness, Time since onset of injury/illness/exacerbation, and 3+ comorbidities: R TKA, R knee manipulation, R knee nerve ablation, osteopenia  are also affecting patient's functional outcome.      REHAB POTENTIAL: Fair given right knee history and time since manipulation   CLINICAL DECISION MAKING: Evolving/moderate complexity   EVALUATION COMPLEXITY: Moderate     GOALS: Goals reviewed with patient?  yes   SHORT TERM GOALS:   Patient will be  independent in self management strategies to improve quality of life and functional outcomes. Baseline: new program Target date: 11/16/2021 Goal status: MET   2.  Patient will report at least 50% improvement in overall symptoms and/or function to demonstrate improved functional mobility Baseline: 0% Target date: 11/16/2021 Goal status: MET   3.  Patient will demonstrate at least 90 degree of right knee flexion  Baseline: 74 degrees Target date: 11/16/2021 Goal status: PROGRESSING           LONG TERM GOALS:   Patient will report at least 75% improvement in overall symptoms and/or function to demonstrate improved functional mobility Baseline: 0% Target date: 12/07/2021 Goal status: PROGRESSING   2.  Patient will be able to transition from sit to stand with use of one hand if needed but without kicking out right leg to improve ability to transition from STS. Baseline: kicks out leg Target date: 12/07/2021 Goal status: PROGRESSING   3.  Patient will be able walk up the steps with reciprocating gait going up to improve ability to go up stairs at home. Baseline: crawls up stairs/step to gait pattern Target date: 12/07/2021 Goal status: PROGRESSING- able to perform in clinic but not reciprocal in clinic       PLAN: PT FREQUENCY: 2x/week   PT DURATION: 6 weeks   PLANNED INTERVENTIONS: Therapeutic exercises, Therapeutic activity, Neuromuscular re-education, Balance training, Gait training, Patient/Family education, Joint mobilization, Stair training, Visual/preceptual remediation/compensation, Dry Needling, Electrical stimulation, Spinal mobilization, Cryotherapy, Moist heat, Traction, Ultrasound, and Manual therapy   PLAN FOR NEXT SESSION: DC the HEP   10:20 AM, 12/10/21 Jerene Pitch, DPT Physical Therapy with [facility]ston Sinner

## 2021-12-11 DIAGNOSIS — J301 Allergic rhinitis due to pollen: Secondary | ICD-10-CM | POA: Diagnosis not present

## 2021-12-11 DIAGNOSIS — J3081 Allergic rhinitis due to animal (cat) (dog) hair and dander: Secondary | ICD-10-CM | POA: Diagnosis not present

## 2021-12-11 DIAGNOSIS — J3089 Other allergic rhinitis: Secondary | ICD-10-CM | POA: Diagnosis not present

## 2021-12-16 DIAGNOSIS — J3089 Other allergic rhinitis: Secondary | ICD-10-CM | POA: Diagnosis not present

## 2021-12-16 DIAGNOSIS — J301 Allergic rhinitis due to pollen: Secondary | ICD-10-CM | POA: Diagnosis not present

## 2021-12-16 DIAGNOSIS — J3081 Allergic rhinitis due to animal (cat) (dog) hair and dander: Secondary | ICD-10-CM | POA: Diagnosis not present

## 2021-12-22 DIAGNOSIS — M25562 Pain in left knee: Secondary | ICD-10-CM | POA: Diagnosis not present

## 2021-12-22 DIAGNOSIS — M170 Bilateral primary osteoarthritis of knee: Secondary | ICD-10-CM | POA: Diagnosis not present

## 2021-12-22 DIAGNOSIS — M25561 Pain in right knee: Secondary | ICD-10-CM | POA: Diagnosis not present

## 2021-12-23 DIAGNOSIS — J3089 Other allergic rhinitis: Secondary | ICD-10-CM | POA: Diagnosis not present

## 2021-12-23 DIAGNOSIS — J3081 Allergic rhinitis due to animal (cat) (dog) hair and dander: Secondary | ICD-10-CM | POA: Diagnosis not present

## 2021-12-23 DIAGNOSIS — J301 Allergic rhinitis due to pollen: Secondary | ICD-10-CM | POA: Diagnosis not present

## 2022-01-06 ENCOUNTER — Other Ambulatory Visit: Payer: Self-pay | Admitting: Sports Medicine

## 2022-01-06 ENCOUNTER — Ambulatory Visit
Admission: RE | Admit: 2022-01-06 | Discharge: 2022-01-06 | Disposition: A | Discharge status: Home / Self Care | Payer: Medicare HMO | Source: Ambulatory Visit | Attending: Sports Medicine | Admitting: Sports Medicine

## 2022-01-06 DIAGNOSIS — M25562 Pain in left knee: Secondary | ICD-10-CM | POA: Diagnosis not present

## 2022-01-06 DIAGNOSIS — M4726 Other spondylosis with radiculopathy, lumbar region: Secondary | ICD-10-CM

## 2022-01-06 DIAGNOSIS — J301 Allergic rhinitis due to pollen: Secondary | ICD-10-CM | POA: Diagnosis not present

## 2022-01-06 DIAGNOSIS — M545 Low back pain, unspecified: Secondary | ICD-10-CM | POA: Diagnosis not present

## 2022-01-06 DIAGNOSIS — J3081 Allergic rhinitis due to animal (cat) (dog) hair and dander: Secondary | ICD-10-CM | POA: Diagnosis not present

## 2022-01-06 DIAGNOSIS — M1712 Unilateral primary osteoarthritis, left knee: Secondary | ICD-10-CM | POA: Diagnosis not present

## 2022-01-06 DIAGNOSIS — M25561 Pain in right knee: Secondary | ICD-10-CM | POA: Diagnosis not present

## 2022-01-06 DIAGNOSIS — J3089 Other allergic rhinitis: Secondary | ICD-10-CM | POA: Diagnosis not present

## 2022-01-06 DIAGNOSIS — G8929 Other chronic pain: Secondary | ICD-10-CM | POA: Diagnosis not present

## 2022-01-06 DIAGNOSIS — M170 Bilateral primary osteoarthritis of knee: Secondary | ICD-10-CM | POA: Diagnosis not present

## 2022-01-13 DIAGNOSIS — J3089 Other allergic rhinitis: Secondary | ICD-10-CM | POA: Diagnosis not present

## 2022-01-13 DIAGNOSIS — J301 Allergic rhinitis due to pollen: Secondary | ICD-10-CM | POA: Diagnosis not present

## 2022-01-13 DIAGNOSIS — J3081 Allergic rhinitis due to animal (cat) (dog) hair and dander: Secondary | ICD-10-CM | POA: Diagnosis not present

## 2022-01-20 DIAGNOSIS — J3081 Allergic rhinitis due to animal (cat) (dog) hair and dander: Secondary | ICD-10-CM | POA: Diagnosis not present

## 2022-01-20 DIAGNOSIS — J301 Allergic rhinitis due to pollen: Secondary | ICD-10-CM | POA: Diagnosis not present

## 2022-01-20 DIAGNOSIS — J3089 Other allergic rhinitis: Secondary | ICD-10-CM | POA: Diagnosis not present

## 2022-02-03 ENCOUNTER — Ambulatory Visit (INDEPENDENT_AMBULATORY_CARE_PROVIDER_SITE_OTHER): Payer: Medicare HMO | Admitting: Family Medicine

## 2022-02-03 ENCOUNTER — Encounter: Payer: Self-pay | Admitting: Family Medicine

## 2022-02-03 VITALS — BP 110/72 | HR 90 | Temp 99.4°F | Wt 161.0 lb

## 2022-02-03 DIAGNOSIS — J029 Acute pharyngitis, unspecified: Secondary | ICD-10-CM

## 2022-02-03 DIAGNOSIS — A46 Erysipelas: Secondary | ICD-10-CM

## 2022-02-03 DIAGNOSIS — R509 Fever, unspecified: Secondary | ICD-10-CM | POA: Diagnosis not present

## 2022-02-03 DIAGNOSIS — J020 Streptococcal pharyngitis: Secondary | ICD-10-CM

## 2022-02-03 LAB — POC COVID19 BINAXNOW: SARS Coronavirus 2 Ag: NEGATIVE

## 2022-02-03 LAB — POCT RAPID STREP A (OFFICE): Rapid Strep A Screen: POSITIVE — AB

## 2022-02-03 MED ORDER — CEFUROXIME AXETIL 500 MG PO TABS: 500.0000 mg | ORAL_TABLET | Freq: Two times a day (BID) | ORAL | 0 refills | Status: AC

## 2022-02-03 MED ORDER — TRAMADOL HCL 50 MG PO TABS: ORAL_TABLET | ORAL | 5 refills | Status: DC

## 2022-02-03 NOTE — Progress Notes (Signed)
   Subjective:    Patient ID: Aimee Lawrence, female    DOB: [birth year], 76 y.o.   MRN: [id number]  HPI Here for 2 issues. First about one week ago she developed a fever, headache, ST, hoarseness, and a dry cough. Then yesterday she noticed a red tender spot on the left lower leg that had a blister on it.    Review of Systems  Constitutional:  Positive for fever.  HENT:  Positive for congestion, sore throat and voice change. Negative for ear pain, facial swelling, postnasal drip and sinus pressure.   Eyes: Negative.   Respiratory:  Positive for cough. Negative for choking, shortness of breath and wheezing.   Skin:  Positive for rash.       Objective:   Physical Exam Constitutional:      Appearance: Normal appearance. She is not ill-appearing.  HENT:     Right Ear: Tympanic membrane, ear canal and external ear normal.     Left Ear: Tympanic membrane, ear canal and external ear normal.     Nose: Nose normal.     Mouth/Throat:     Pharynx: Oropharynx is clear.  Eyes:     Conjunctiva/sclera: Conjunctivae normal.  Neck:     Comments: No palpable nodes but there is tenderness in the [facility] areas  Pulmonary:     Effort: Pulmonary effort is normal.     Breath sounds: Normal breath sounds.  Skin:    Comments: The left lower leg has a red macular area about 3 cm in diameter with a large blister (1 cm) on top of it   Neurological:     Mental Status: She is alert.           Assessment & Plan:  She has a streptococcal pharyngitis, and the blister could be a type of bullous erysipelas. We will treat all these with 10 days of Cefuroxime. Recheck as needed.  Alysia Penna, MD

## 2022-02-04 ENCOUNTER — Telehealth: Payer: Self-pay

## 2022-02-04 NOTE — Telephone Encounter (Signed)
Caller got a prescription and she has questions about it. Should she still take it? No symptoms she says. Additional Comment She said "no symptoms yet" and unclear exactly what the medication is for but I did ask.  ---Caller states she has a question about a rx keflex. Caller states wants to know if she can take it if she is allergic to ampicillin. Caller reports she had a rash when she took ampicillin years ago. Caller reports no symptoms that she needs help with now.   ---Advised caller that the allergy is to ampicillin, caution should be used with cephalexin, but does appear that cephalosporins may be safely prescribed for patients who have not had serious reactions to penicillins per [employer] [facility]. Advised caller to call us back if she develops symptoms. Caller verbalized understanding [email or web address]:// [email or web address]~:text=As%20an%20added%20precaution%2C %20cephalosporins,cefaclor%20and%20cefprozil %20(1)  02/03/2022 5:46:21 PM Clinical Call Hendricks Limes, RN, [facility]

## 2022-02-05 ENCOUNTER — Encounter: Payer: Self-pay | Admitting: Family Medicine

## 2022-02-05 DIAGNOSIS — L237 Allergic contact dermatitis due to plants, except food: Secondary | ICD-10-CM | POA: Diagnosis not present

## 2022-02-05 DIAGNOSIS — J020 Streptococcal pharyngitis: Secondary | ICD-10-CM | POA: Diagnosis not present

## 2022-02-05 DIAGNOSIS — R21 Rash and other nonspecific skin eruption: Secondary | ICD-10-CM | POA: Diagnosis not present

## 2022-02-05 NOTE — Telephone Encounter (Signed)
Error/njr °

## 2022-02-12 DIAGNOSIS — J301 Allergic rhinitis due to pollen: Secondary | ICD-10-CM | POA: Diagnosis not present

## 2022-02-12 DIAGNOSIS — J3081 Allergic rhinitis due to animal (cat) (dog) hair and dander: Secondary | ICD-10-CM | POA: Diagnosis not present

## 2022-02-12 DIAGNOSIS — J3089 Other allergic rhinitis: Secondary | ICD-10-CM | POA: Diagnosis not present

## 2022-02-23 ENCOUNTER — Ambulatory Visit: Payer: Medicare Other

## 2022-02-23 ENCOUNTER — Telehealth: Payer: Self-pay

## 2022-02-23 NOTE — Telephone Encounter (Signed)
This nurse attempted to call patient in regards to missed AWV. Message left that we will call again to reschedule for another time.

## 2022-03-03 ENCOUNTER — Telehealth: Payer: Self-pay | Admitting: Family Medicine

## 2022-03-03 NOTE — Telephone Encounter (Signed)
Left message for patient to call back and schedule Medicare Annual Wellness Visit (AWV) either virtually or in office. Left  my Herbie Drape number [phone]   Last AWV 02/17/21 ; please schedule at anytime with [facility] Nurse Health Advisor 1 or 2

## 2022-03-04 NOTE — Telephone Encounter (Signed)
Returned patient call She wanted a call back nov to schedule    Patient returned my call

## 2022-03-08 DIAGNOSIS — J3081 Allergic rhinitis due to animal (cat) (dog) hair and dander: Secondary | ICD-10-CM | POA: Diagnosis not present

## 2022-03-08 DIAGNOSIS — J301 Allergic rhinitis due to pollen: Secondary | ICD-10-CM | POA: Diagnosis not present

## 2022-03-08 DIAGNOSIS — J3089 Other allergic rhinitis: Secondary | ICD-10-CM | POA: Diagnosis not present

## 2022-03-15 ENCOUNTER — Other Ambulatory Visit: Payer: Self-pay | Admitting: Family Medicine

## 2022-03-19 DIAGNOSIS — M179 Osteoarthritis of knee, unspecified: Secondary | ICD-10-CM | POA: Diagnosis not present

## 2022-03-19 DIAGNOSIS — M25561 Pain in right knee: Secondary | ICD-10-CM | POA: Diagnosis not present

## 2022-03-19 DIAGNOSIS — Z96651 Presence of right artificial knee joint: Secondary | ICD-10-CM | POA: Diagnosis not present

## 2022-03-19 DIAGNOSIS — G894 Chronic pain syndrome: Secondary | ICD-10-CM | POA: Diagnosis not present

## 2022-03-19 DIAGNOSIS — M25562 Pain in left knee: Secondary | ICD-10-CM | POA: Diagnosis not present

## 2022-03-19 DIAGNOSIS — M47816 Spondylosis without myelopathy or radiculopathy, lumbar region: Secondary | ICD-10-CM | POA: Diagnosis not present

## 2022-03-22 DIAGNOSIS — J301 Allergic rhinitis due to pollen: Secondary | ICD-10-CM | POA: Diagnosis not present

## 2022-03-22 DIAGNOSIS — J3089 Other allergic rhinitis: Secondary | ICD-10-CM | POA: Diagnosis not present

## 2022-03-22 DIAGNOSIS — J3081 Allergic rhinitis due to animal (cat) (dog) hair and dander: Secondary | ICD-10-CM | POA: Diagnosis not present

## 2022-04-02 DIAGNOSIS — J301 Allergic rhinitis due to pollen: Secondary | ICD-10-CM | POA: Diagnosis not present

## 2022-04-02 DIAGNOSIS — J3081 Allergic rhinitis due to animal (cat) (dog) hair and dander: Secondary | ICD-10-CM | POA: Diagnosis not present

## 2022-04-02 DIAGNOSIS — J3089 Other allergic rhinitis: Secondary | ICD-10-CM | POA: Diagnosis not present

## 2022-04-14 DIAGNOSIS — J3089 Other allergic rhinitis: Secondary | ICD-10-CM | POA: Diagnosis not present

## 2022-04-14 DIAGNOSIS — J3081 Allergic rhinitis due to animal (cat) (dog) hair and dander: Secondary | ICD-10-CM | POA: Diagnosis not present

## 2022-04-14 DIAGNOSIS — J301 Allergic rhinitis due to pollen: Secondary | ICD-10-CM | POA: Diagnosis not present

## 2022-04-27 DIAGNOSIS — J3089 Other allergic rhinitis: Secondary | ICD-10-CM | POA: Diagnosis not present

## 2022-04-27 DIAGNOSIS — J3081 Allergic rhinitis due to animal (cat) (dog) hair and dander: Secondary | ICD-10-CM | POA: Diagnosis not present

## 2022-04-27 DIAGNOSIS — J301 Allergic rhinitis due to pollen: Secondary | ICD-10-CM | POA: Diagnosis not present

## 2022-05-12 ENCOUNTER — Telehealth: Payer: Self-pay | Admitting: Family Medicine

## 2022-05-12 NOTE — Telephone Encounter (Signed)
Left message for patient to call back and schedule Medicare Annual Wellness Visit (AWV) either virtually or in office. Left  my Herbie Drape number [phone]   Last AWV  02/17/21 please schedule with Nurse Health Adviser   45 min for awv-i and in office appointments 30 min for awv-s  phone/virtual appointments

## 2022-05-14 DIAGNOSIS — U071 COVID-19: Secondary | ICD-10-CM | POA: Diagnosis not present

## 2022-05-14 DIAGNOSIS — B349 Viral infection, unspecified: Secondary | ICD-10-CM | POA: Diagnosis not present

## 2022-05-28 DIAGNOSIS — J301 Allergic rhinitis due to pollen: Secondary | ICD-10-CM | POA: Diagnosis not present

## 2022-05-28 DIAGNOSIS — J3089 Other allergic rhinitis: Secondary | ICD-10-CM | POA: Diagnosis not present

## 2022-05-28 DIAGNOSIS — J3081 Allergic rhinitis due to animal (cat) (dog) hair and dander: Secondary | ICD-10-CM | POA: Diagnosis not present

## 2022-06-08 DIAGNOSIS — J3081 Allergic rhinitis due to animal (cat) (dog) hair and dander: Secondary | ICD-10-CM | POA: Diagnosis not present

## 2022-06-08 DIAGNOSIS — J3089 Other allergic rhinitis: Secondary | ICD-10-CM | POA: Diagnosis not present

## 2022-06-08 DIAGNOSIS — J4520 Mild intermittent asthma, uncomplicated: Secondary | ICD-10-CM | POA: Diagnosis not present

## 2022-06-08 DIAGNOSIS — J301 Allergic rhinitis due to pollen: Secondary | ICD-10-CM | POA: Diagnosis not present

## 2022-06-17 ENCOUNTER — Telehealth: Payer: Self-pay | Admitting: Family Medicine

## 2022-06-17 NOTE — Telephone Encounter (Signed)
Left message for patient to call back and schedule Medicare Annual Wellness Visit (AWV) either virtually or in office. Left  my Herbie Drape number [phone]   Last AWV 02/17/21 please schedule with Nurse Health Adviser   45 min for awv-i and in office appointments 30 min for awv-s  phone/virtual appointments

## 2022-06-22 DIAGNOSIS — J3081 Allergic rhinitis due to animal (cat) (dog) hair and dander: Secondary | ICD-10-CM | POA: Diagnosis not present

## 2022-06-22 DIAGNOSIS — J3089 Other allergic rhinitis: Secondary | ICD-10-CM | POA: Diagnosis not present

## 2022-06-22 DIAGNOSIS — J301 Allergic rhinitis due to pollen: Secondary | ICD-10-CM | POA: Diagnosis not present

## 2022-06-30 DIAGNOSIS — J3081 Allergic rhinitis due to animal (cat) (dog) hair and dander: Secondary | ICD-10-CM | POA: Diagnosis not present

## 2022-06-30 DIAGNOSIS — J3089 Other allergic rhinitis: Secondary | ICD-10-CM | POA: Diagnosis not present

## 2022-06-30 DIAGNOSIS — J301 Allergic rhinitis due to pollen: Secondary | ICD-10-CM | POA: Diagnosis not present

## 2022-07-11 DIAGNOSIS — R490 Dysphonia: Secondary | ICD-10-CM | POA: Diagnosis not present

## 2022-07-11 DIAGNOSIS — R0989 Other specified symptoms and signs involving the circulatory and respiratory systems: Secondary | ICD-10-CM | POA: Diagnosis not present

## 2022-07-11 DIAGNOSIS — R058 Other specified cough: Secondary | ICD-10-CM | POA: Diagnosis not present

## 2022-07-16 DIAGNOSIS — J301 Allergic rhinitis due to pollen: Secondary | ICD-10-CM | POA: Diagnosis not present

## 2022-07-16 DIAGNOSIS — J3081 Allergic rhinitis due to animal (cat) (dog) hair and dander: Secondary | ICD-10-CM | POA: Diagnosis not present

## 2022-07-16 DIAGNOSIS — J3089 Other allergic rhinitis: Secondary | ICD-10-CM | POA: Diagnosis not present

## 2022-07-23 DIAGNOSIS — J3089 Other allergic rhinitis: Secondary | ICD-10-CM | POA: Diagnosis not present

## 2022-07-23 DIAGNOSIS — J301 Allergic rhinitis due to pollen: Secondary | ICD-10-CM | POA: Diagnosis not present

## 2022-07-23 DIAGNOSIS — J3081 Allergic rhinitis due to animal (cat) (dog) hair and dander: Secondary | ICD-10-CM | POA: Diagnosis not present

## 2022-07-30 DIAGNOSIS — J3089 Other allergic rhinitis: Secondary | ICD-10-CM | POA: Diagnosis not present

## 2022-07-30 DIAGNOSIS — J3081 Allergic rhinitis due to animal (cat) (dog) hair and dander: Secondary | ICD-10-CM | POA: Diagnosis not present

## 2022-07-30 DIAGNOSIS — J301 Allergic rhinitis due to pollen: Secondary | ICD-10-CM | POA: Diagnosis not present

## 2022-08-06 DIAGNOSIS — J3081 Allergic rhinitis due to animal (cat) (dog) hair and dander: Secondary | ICD-10-CM | POA: Diagnosis not present

## 2022-08-06 DIAGNOSIS — J3089 Other allergic rhinitis: Secondary | ICD-10-CM | POA: Diagnosis not present

## 2022-08-06 DIAGNOSIS — J301 Allergic rhinitis due to pollen: Secondary | ICD-10-CM | POA: Diagnosis not present

## 2022-08-20 ENCOUNTER — Telehealth: Payer: Self-pay | Admitting: Family Medicine

## 2022-08-20 NOTE — Telephone Encounter (Signed)
Left message for patient to call back and schedule Medicare Annual Wellness Visit (AWV) either virtually or in office. Left  my Herbie Drape number [phone]   Last AWV 02/17/21 please schedule with Nurse Health Adviser   45 min for awv-i  in office appointments 30 min for awv-s & awv-i phone/virtual appointments

## 2022-08-27 ENCOUNTER — Encounter (HOSPITAL_BASED_OUTPATIENT_CLINIC_OR_DEPARTMENT_OTHER): Payer: Self-pay

## 2022-08-27 ENCOUNTER — Other Ambulatory Visit (HOSPITAL_BASED_OUTPATIENT_CLINIC_OR_DEPARTMENT_OTHER): Payer: Self-pay

## 2022-08-27 ENCOUNTER — Other Ambulatory Visit: Payer: Self-pay

## 2022-08-27 ENCOUNTER — Emergency Department (HOSPITAL_BASED_OUTPATIENT_CLINIC_OR_DEPARTMENT_OTHER): Payer: Medicare HMO | Admitting: Radiology

## 2022-08-27 ENCOUNTER — Emergency Department (HOSPITAL_BASED_OUTPATIENT_CLINIC_OR_DEPARTMENT_OTHER)
Admission: EM | Admit: 2022-08-27 | Discharge: 2022-08-27 | Disposition: A | Discharge status: Home / Self Care | Payer: Medicare HMO | Attending: Emergency Medicine | Admitting: Emergency Medicine

## 2022-08-27 DIAGNOSIS — Z1152 Encounter for screening for COVID-19: Secondary | ICD-10-CM | POA: Diagnosis not present

## 2022-08-27 DIAGNOSIS — J40 Bronchitis, not specified as acute or chronic: Secondary | ICD-10-CM | POA: Insufficient documentation

## 2022-08-27 DIAGNOSIS — R058 Other specified cough: Secondary | ICD-10-CM | POA: Diagnosis not present

## 2022-08-27 DIAGNOSIS — R059 Cough, unspecified: Secondary | ICD-10-CM | POA: Diagnosis not present

## 2022-08-27 LAB — CBC
HCT: 45.3 % (ref 36.0–46.0)
Hemoglobin: 14.6 g/dL (ref 12.0–15.0)
MCH: 29 pg (ref 26.0–34.0)
MCHC: 32.2 g/dL (ref 30.0–36.0)
MCV: 90.1 fL (ref 80.0–100.0)
Platelets: 255 10*3/uL (ref 150–400)
RBC: 5.03 MIL/uL (ref 3.87–5.11)
RDW: 13.7 % (ref 11.5–15.5)
WBC: 6.9 10*3/uL (ref 4.0–10.5)
nRBC: 0 % (ref 0.0–0.2)

## 2022-08-27 LAB — BASIC METABOLIC PANEL
Anion gap: 6 (ref 5–15)
BUN: 21 mg/dL (ref 8–23)
CO2: 34 mmol/L — ABNORMAL HIGH (ref 22–32)
Calcium: 9.5 mg/dL (ref 8.9–10.3)
Chloride: 101 mmol/L (ref 98–111)
Creatinine, Ser: 0.87 mg/dL (ref 0.44–1.00)
GFR, Estimated: >60 mL/min (ref >60)
Glucose, Bld: 125 mg/dL — ABNORMAL HIGH (ref 70–99)
Potassium: 3.9 mmol/L (ref 3.5–5.1)
Sodium: 141 mmol/L (ref 135–145)

## 2022-08-27 LAB — RESP PANEL BY RT-PCR (RSV, FLU A&B, COVID)  RVPGX2
Influenza A by PCR: NEGATIVE
Influenza B by PCR: NEGATIVE
Resp Syncytial Virus by PCR: NEGATIVE
SARS Coronavirus 2 by RT PCR: NEGATIVE

## 2022-08-27 MED ORDER — PREDNISONE 50 MG PO TABS
50.0000 mg | ORAL_TABLET | Freq: Every day | ORAL | 0 refills | Status: DC
  Filled 2022-08-27: qty 5, 5d supply, fill #0

## 2022-08-27 MED ORDER — GUAIFENESIN ER 1200 MG PO TB12
1.0000 | ORAL_TABLET | Freq: Two times a day (BID) | ORAL | 0 refills | Status: DC
  Filled 2022-08-27: qty 14, 7d supply, fill #0

## 2022-08-27 MED ORDER — IPRATROPIUM-ALBUTEROL 0.5-2.5 (3) MG/3ML IN SOLN
3.0000 mL | Freq: Once | RESPIRATORY_TRACT | Status: AC
  Administered 2022-08-27: 3 mL via RESPIRATORY_TRACT
  Filled 2022-08-27: qty 3

## 2022-08-27 MED ORDER — PREDNISONE 50 MG PO TABS
60.0000 mg | ORAL_TABLET | Freq: Once | ORAL | Status: AC
  Administered 2022-08-27: 60 mg via ORAL
  Filled 2022-08-27: qty 1

## 2022-08-27 MED ORDER — ALBUTEROL SULFATE HFA 108 (90 BASE) MCG/ACT IN AERS
1.0000 | INHALATION_SPRAY | Freq: Four times a day (QID) | RESPIRATORY_TRACT | 0 refills | Status: AC | PRN
  Filled 2022-08-27: qty 8.5, 25d supply, fill #0

## 2022-08-27 NOTE — ED Notes (Signed)
Pt ambulatory 20+ feet with no significant respiratory distress, O2 ranging from 93-94%, pulse from 88-99. Patient tolerated well, now sitting up in stretcher with visitor at bedside.

## 2022-08-27 NOTE — ED Notes (Signed)
Reviewed AVS with patient including diagnosis, medications and follow up, patient expresses understanding and denies further questions at this time, patient and visitor ambulatory with canes out of ED.

## 2022-08-27 NOTE — Discharge Instructions (Addendum)
Take the medications for your cough and congestion.  Follow-up with your doctor next week to be rechecked if the symptoms have not resolved.  Return as needed for worsening shortness of breath, high fevers

## 2022-08-27 NOTE — ED Provider Notes (Signed)
[facility] Provider Note   CSN: [id number] Arrival date & time: 08/27/22  1416     History  Chief Complaint  Patient presents with   Cough    Aimee Lawrence is a 77 y.o. female.   Cough    Pt called her doctor today and was told to come to the ED because they did not have any openings.  Pt started having a cough especially over the last couple of days ago.  She has had some rattling in her chest.  No fevers that she is aware of.    No chest pain or shortness of breath.  She has noticed some leg swelling but that is a chronic issue and not new.  Home Medications Prior to Admission medications   Medication Sig Start Date End Date Taking? Authorizing Provider  albuterol (VENTOLIN HFA) 108 (90 Base) MCG/ACT inhaler Inhale 1-2 puffs into the lungs every 6 (six) hours as needed for wheezing or shortness of breath. 08/27/22  Yes Dorie Rank, MD  Guaifenesin 1200 MG TB12 Take 1 tablet (1,200 mg total) by mouth 2 (two) times daily at 10 AM and 5 PM. 08/27/22  Yes Dorie Rank, MD  predniSONE (DELTASONE) 50 MG tablet Take 1 tablet (50 mg total) by mouth daily. 08/27/22  Yes Dorie Rank, MD  acetaminophen (TYLENOL) 500 MG tablet Take 500 mg by mouth every 6 (six) hours as needed for moderate pain.    [provider]  Calcium Carbonate-Vitamin D (CALCIUM 500/D PO) Take 2 tablets by mouth daily. 500 mg-200 mg    [provider]  Cholecalciferol (VITAMIN D3) 2000 units capsule Take 4,000 Units by mouth daily.     [provider]  EPINEPHrine 0.3 mg/0.3 mL IJ SOAJ injection     [provider]  FIBER SELECT GUMMIES PO Take 1 tablet by mouth daily.    [provider]  fluticasone (FLONASE) 50 MCG/ACT nasal spray Place 1 spray into both nostrils daily.     [provider]  montelukast (SINGULAIR) 10 MG tablet Take 1 tablet by mouth once daily or as directed. 08/21/21   [name], MD  traMADol  Veatrice Bourbon) 50 MG tablet TAKE TWO TABLETS BY MOUTH EVERY 6 HOURS AS NEEDED 03/17/22   [name], MD      Allergies    Aleve [naproxen sodium], Aspirin, Ibuprofen, Ampicillin, and Erythromycin    Review of Systems   Review of Systems  Respiratory:  Positive for cough.     Physical Exam Updated Vital Signs BP 122/81   Pulse 87   Temp 98.3 F (36.8 C) (Oral)   Resp 20   Ht 1.549 m (5' 1"$ )   Wt 73 kg   SpO2 94%   BMI 30.41 kg/m  Physical Exam Vitals and nursing note reviewed.  Constitutional:      Appearance: She is well-developed. She is not ill-appearing or diaphoretic.  HENT:     Head: Normocephalic and atraumatic.     Right Ear: External ear normal.     Left Ear: External ear normal.  Eyes:     General: No scleral icterus.       Right eye: No discharge.        Left eye: No discharge.     Conjunctiva/sclera: Conjunctivae normal.  Neck:     Trachea: No tracheal deviation.  Cardiovascular:     Rate and Rhythm: Normal rate and regular rhythm.  Pulmonary:  Effort: Pulmonary effort is normal. No respiratory distress.     Breath sounds: No stridor. Wheezing present. No rales.  Abdominal:     General: Bowel sounds are normal. There is no distension.     Palpations: Abdomen is soft.     Tenderness: There is no abdominal tenderness. There is no guarding or rebound.  Musculoskeletal:        General: No tenderness or deformity.     Cervical back: Neck supple.  Skin:    General: Skin is warm and dry.     Findings: No rash.  Neurological:     General: No focal deficit present.     Mental Status: She is alert.     Cranial Nerves: No cranial nerve deficit, dysarthria or facial asymmetry.     Sensory: No sensory deficit.     Motor: No abnormal muscle tone or seizure activity.     Coordination: Coordination normal.  Psychiatric:        Mood and Affect: Mood normal.     ED Results / Procedures / Treatments   Labs (all labs ordered are listed, but only abnormal  results are displayed) Labs Reviewed  BASIC METABOLIC PANEL - Abnormal; Notable for the following components:      Result Value   CO2 34 (*)    Glucose, Bld 125 (*)    All other components within normal limits  RESP PANEL BY RT-PCR (RSV, FLU A&B, COVID)  [id number]  CBC    EKG None  Radiology DG Chest 2 View  Result Date: 08/27/2022 CLINICAL DATA:  Cough and congestion x2 days. EXAM: CHEST - 2 VIEW COMPARISON:  None Available. FINDINGS: Clear lungs. Normal heart size and mediastinal contours. No pleural effusion or pneumothorax. Visualized bones and upper abdomen are unremarkable. IMPRESSION: No evidence of acute cardiopulmonary disease. Electronically Signed   By: Emmit Alexanders M.D.   On: 08/27/2022 14:50    Procedures Procedures    Medications Ordered in ED Medications  ipratropium-albuterol (DUONEB) 0.5-2.5 (3) MG/3ML nebulizer solution 3 mL (3 mLs Nebulization Given 08/27/22 1519)  predniSONE (DELTASONE) tablet 60 mg (60 mg Oral Given 08/27/22 1527)    ED Course/ Medical Decision Making/ A&P Clinical Course as of 08/27/22 1641  Fri Aug 27, 2022  1609 CBC CBC normal [JK]  123456 Basic metabolic panel(!) Metabolic panel normal with exception of bicarb slightly increased [JK]  1609 DG Chest 2 View Chest x-ray without signs of pneumonia [JK]    Clinical Course User Index [JK] Dorie Rank, MD                             Medical Decision Making Problems Addressed: Bronchitis: acute illness or injury that poses a threat to life or bodily functions  Amount and/or Complexity of Data Reviewed Labs: ordered. Decision-making details documented in ED Course. Radiology: ordered and independent interpretation performed. Decision-making details documented in ED Course.  Risk OTC drugs. Prescription drug management.   Patient presented to the ER for complaints of cough congestion shortness of breath.  Exam suggestive of infectious etiology.  Patient does not have any history of  CHF and she does not have any evidence of peripheral edema.  On exam I did hear some slight wheezing.  X-ray does not show pneumonia.  COVID flu RSV negative.  CBC metabolic panel reassuring.  Patient improved with albuterol treatment and steroids.  She has normal oxygen saturation on room air.  Will discharge  home with prescription for steroids albuterol and guaifenesin        Final Clinical Impression(s) / ED Diagnoses Final diagnoses:  Bronchitis    Rx / DC Orders ED Discharge Orders          Ordered    predniSONE (DELTASONE) 50 MG tablet  Daily        08/27/22 1639    albuterol (VENTOLIN HFA) 108 (90 Base) MCG/ACT inhaler  Every 6 hours PRN        08/27/22 1639    Guaifenesin 1200 MG TB12  2 times daily        08/27/22 1639              Dorie Rank, MD 08/27/22 1641

## 2022-08-27 NOTE — ED Triage Notes (Signed)
Patient here POV from Home.  Endorses Cough that began 2 Days ago. Subsided somewhat and then returned. Some Congestion. No Known fevers.   NAD Noted during Triage. A&Ox4. GCS 15. Ambulatory.

## 2022-08-31 ENCOUNTER — Telehealth: Payer: Self-pay

## 2022-08-31 NOTE — Telephone Encounter (Signed)
        Patient  visited [facility] on 2/16   Telephone encounter attempt :  1st  A HIPAA compliant voice message was left requesting a return call.  Instructed patient to call back    [facility] [phone] [address], [place], [place] [address] Phone: [phone] Email: [email or web address]

## 2022-09-01 ENCOUNTER — Telehealth: Payer: Self-pay

## 2022-09-01 NOTE — Telephone Encounter (Signed)
        Patient  visited [facility] on 2/16     Telephone encounter attempt : 2nd   A HIPAA compliant voice message was left requesting a return call.  Instructed patient to call back    [facility] [phone] [address], [place], [place] [address] Phone: [phone] Email: [email or web address]

## 2022-09-03 DIAGNOSIS — J3089 Other allergic rhinitis: Secondary | ICD-10-CM | POA: Diagnosis not present

## 2022-09-03 DIAGNOSIS — J301 Allergic rhinitis due to pollen: Secondary | ICD-10-CM | POA: Diagnosis not present

## 2022-09-03 DIAGNOSIS — J3081 Allergic rhinitis due to animal (cat) (dog) hair and dander: Secondary | ICD-10-CM | POA: Diagnosis not present

## 2022-09-07 ENCOUNTER — Encounter: Payer: Self-pay | Admitting: Family Medicine

## 2022-09-07 ENCOUNTER — Ambulatory Visit (INDEPENDENT_AMBULATORY_CARE_PROVIDER_SITE_OTHER): Payer: Medicare HMO | Admitting: Family Medicine

## 2022-09-07 VITALS — BP 124/78 | HR 92 | Temp 98.3°F | Wt 167.9 lb

## 2022-09-07 DIAGNOSIS — M25562 Pain in left knee: Secondary | ICD-10-CM

## 2022-09-07 DIAGNOSIS — M25561 Pain in right knee: Secondary | ICD-10-CM | POA: Diagnosis not present

## 2022-09-07 DIAGNOSIS — J45901 Unspecified asthma with (acute) exacerbation: Secondary | ICD-10-CM

## 2022-09-07 DIAGNOSIS — G8929 Other chronic pain: Secondary | ICD-10-CM | POA: Diagnosis not present

## 2022-09-07 NOTE — Progress Notes (Signed)
   Subjective:    Patient ID: Aimee Lawrence, female    DOB: 20-Aug-1945, 77 y.o.   MRN: [id number]  HPI Here to follow up on an ED visit on 08-27-22 for coughing and wheezing. This had started a few days before this, but she went to the ED due to worsening SOB. No fever or ST. The cough was dry. At the ED her exam was normal. Labs and a CXR were all normal. It was felt that she had a viral URI that had caused an exacerbation of her asthma. She was given a nebulization treatment with Duoneb and she was started on a Prednisone taper. Since then she has felt completely back to normal. The other issue she wants to discuss is her chronic knee pains. She tried to walk for exercise but she cannot go very far due to the pain.    Review of Systems  Constitutional: Negative.   HENT: Negative.    Eyes: Negative.   Respiratory: Negative.    Cardiovascular: Negative.   Musculoskeletal:  Positive for arthralgias.       Objective:   Physical Exam Constitutional:      Appearance: She is obese.     Comments: She walks normally   Cardiovascular:     Rate and Rhythm: Normal rate and regular rhythm.     Pulses: Normal pulses.     Heart sounds: Normal heart sounds.  Pulmonary:     Effort: Pulmonary effort is normal.  Musculoskeletal:     Comments: She is tender in both knees at both joint spaces. ROM is limited by pain   Neurological:     Mental Status: She is alert.           Assessment & Plan:  She had an acute exacerbation of asthma, likely brought on by a viral URI. This seems to have settled down, so she can go back to using her rescue albuterol as needed. For the chronic knee pain, we will refer her to [facility]. We spent a total of (34   )minutes reviewing records and discussing these issues.  Alysia Penna, MD

## 2022-09-10 DIAGNOSIS — J301 Allergic rhinitis due to pollen: Secondary | ICD-10-CM | POA: Diagnosis not present

## 2022-09-10 DIAGNOSIS — J3089 Other allergic rhinitis: Secondary | ICD-10-CM | POA: Diagnosis not present

## 2022-09-10 DIAGNOSIS — J3081 Allergic rhinitis due to animal (cat) (dog) hair and dander: Secondary | ICD-10-CM | POA: Diagnosis not present

## 2022-09-23 DIAGNOSIS — J3081 Allergic rhinitis due to animal (cat) (dog) hair and dander: Secondary | ICD-10-CM | POA: Diagnosis not present

## 2022-09-23 DIAGNOSIS — J301 Allergic rhinitis due to pollen: Secondary | ICD-10-CM | POA: Diagnosis not present

## 2022-09-23 DIAGNOSIS — J3089 Other allergic rhinitis: Secondary | ICD-10-CM | POA: Diagnosis not present

## 2022-09-25 ENCOUNTER — Emergency Department (HOSPITAL_BASED_OUTPATIENT_CLINIC_OR_DEPARTMENT_OTHER)
Admission: EM | Admit: 2022-09-25 | Discharge: 2022-09-25 | Disposition: A | Discharge status: Home / Self Care | Payer: Medicare HMO | Attending: Emergency Medicine | Admitting: Emergency Medicine

## 2022-09-25 ENCOUNTER — Other Ambulatory Visit: Payer: Self-pay

## 2022-09-25 ENCOUNTER — Encounter (HOSPITAL_BASED_OUTPATIENT_CLINIC_OR_DEPARTMENT_OTHER): Payer: Self-pay | Admitting: Emergency Medicine

## 2022-09-25 ENCOUNTER — Emergency Department (HOSPITAL_BASED_OUTPATIENT_CLINIC_OR_DEPARTMENT_OTHER): Payer: Medicare HMO

## 2022-09-25 DIAGNOSIS — R112 Nausea with vomiting, unspecified: Secondary | ICD-10-CM | POA: Diagnosis not present

## 2022-09-25 DIAGNOSIS — R1010 Upper abdominal pain, unspecified: Secondary | ICD-10-CM | POA: Diagnosis not present

## 2022-09-25 DIAGNOSIS — R079 Chest pain, unspecified: Secondary | ICD-10-CM | POA: Insufficient documentation

## 2022-09-25 DIAGNOSIS — R0789 Other chest pain: Secondary | ICD-10-CM | POA: Diagnosis not present

## 2022-09-25 DIAGNOSIS — R110 Nausea: Secondary | ICD-10-CM | POA: Diagnosis not present

## 2022-09-25 DIAGNOSIS — R197 Diarrhea, unspecified: Secondary | ICD-10-CM | POA: Insufficient documentation

## 2022-09-25 LAB — CBC WITH DIFFERENTIAL/PLATELET
Abs Immature Granulocytes: 0.02 10*3/uL (ref 0.00–0.07)
Basophils Absolute: 0.1 10*3/uL (ref 0.0–0.1)
Basophils Relative: 1 %
Eosinophils Absolute: 0.2 10*3/uL (ref 0.0–0.5)
Eosinophils Relative: 4 %
HCT: 41.2 % (ref 36.0–46.0)
Hemoglobin: 13.7 g/dL (ref 12.0–15.0)
Immature Granulocytes: 0 %
Lymphocytes Relative: 17 %
Lymphs Abs: 1 10*3/uL (ref 0.7–4.0)
MCH: 29.4 pg (ref 26.0–34.0)
MCHC: 33.3 g/dL (ref 30.0–36.0)
MCV: 88.4 fL (ref 80.0–100.0)
Monocytes Absolute: 0.4 10*3/uL (ref 0.1–1.0)
Monocytes Relative: 8 %
Neutro Abs: 4.1 10*3/uL (ref 1.7–7.7)
Neutrophils Relative %: 70 %
Platelets: 282 10*3/uL (ref 150–400)
RBC: 4.66 MIL/uL (ref 3.87–5.11)
RDW: 14.1 % (ref 11.5–15.5)
WBC: 5.8 10*3/uL (ref 4.0–10.5)
nRBC: 0 % (ref 0.0–0.2)

## 2022-09-25 LAB — COMPREHENSIVE METABOLIC PANEL
ALT: 10 U/L (ref 0–44)
AST: 15 U/L (ref 15–41)
Albumin: 4 g/dL (ref 3.5–5.0)
Alkaline Phosphatase: 87 U/L (ref 38–126)
Anion gap: 8 (ref 5–15)
BUN: 12 mg/dL (ref 8–23)
CO2: 29 mmol/L (ref 22–32)
Calcium: 9.4 mg/dL (ref 8.9–10.3)
Chloride: 103 mmol/L (ref 98–111)
Creatinine, Ser: 0.64 mg/dL (ref 0.44–1.00)
GFR, Estimated: >60 mL/min (ref >60)
Glucose, Bld: 108 mg/dL — ABNORMAL HIGH (ref 70–99)
Potassium: 3.8 mmol/L (ref 3.5–5.1)
Sodium: 140 mmol/L (ref 135–145)
Total Bilirubin: 0.4 mg/dL (ref 0.3–1.2)
Total Protein: 7.1 g/dL (ref 6.5–8.1)

## 2022-09-25 LAB — TROPONIN I (HIGH SENSITIVITY): Troponin I (High Sensitivity): 3 ng/L (ref <18)

## 2022-09-25 LAB — LIPASE, BLOOD: Lipase: <10 U/L — ABNORMAL LOW (ref 11–51)

## 2022-09-25 MED ORDER — OMEPRAZOLE 20 MG PO CPDR: 20.0000 mg | DELAYED_RELEASE_CAPSULE | Freq: Every day | ORAL | 0 refills | Status: DC

## 2022-09-25 MED ORDER — ONDANSETRON 4 MG PO TBDP
4.0000 mg | ORAL_TABLET | Freq: Once | ORAL | Status: AC
  Administered 2022-09-25: 4 mg via ORAL
  Filled 2022-09-25: qty 1

## 2022-09-25 MED ORDER — ONDANSETRON HCL 4 MG PO TABS: 4.0000 mg | ORAL_TABLET | Freq: Four times a day (QID) | ORAL | 0 refills | Status: DC

## 2022-09-25 NOTE — ED Provider Notes (Signed)
Aimee Lawrence   CSN: [id number] Arrival date & time: 09/25/22  1402     History  Chief Complaint  Patient presents with   Chest Pain    Aimee Lawrence is a 77 y.o. female.  Patient here with nausea vomiting, diarrhea, chest pain, upper abdominal pain.  Symptoms on and off for few days.  Feels little bit better now.  Feels undigested.  Nothing makes it worse or better.  Denies any fevers or chills.  No obvious sick contacts or suspicious food intake.  The history is provided by the patient.       Home Medications Prior to Admission medications   Medication Sig Start Date End Date Taking? Authorizing Provider  ondansetron (ZOFRAN) 4 MG tablet Take 1 tablet (4 mg total) by mouth every 6 (six) hours. 09/25/22  Yes Mylin Gignac, DO  acetaminophen (TYLENOL) 500 MG tablet Take 500 mg by mouth every 6 (six) hours as needed for moderate pain.    [provider]  albuterol (VENTOLIN HFA) 108 (90 Base) MCG/ACT inhaler Inhale 1-2 puffs into the lungs every 6 (six) hours as needed for wheezing or shortness of breath. 08/27/22   Dorie Rank, MD  Calcium Carbonate-Vitamin D (CALCIUM 500/D PO) Take 2 tablets by mouth daily. 500 mg-200 mg    [provider]  Cholecalciferol (VITAMIN D3) 2000 units capsule Take 4,000 Units by mouth daily.     [provider]  EPINEPHrine 0.3 mg/0.3 mL IJ SOAJ injection     [provider]  Famotidine (ACID CONTROLLER PO) Take by mouth daily.    [provider]  FIBER SELECT GUMMIES PO Take 1 tablet by mouth daily.    [provider]  fluticasone (FLONASE) 50 MCG/ACT nasal spray Place 1 spray into both nostrils daily.     [provider]  Guaifenesin 1200 MG TB12 Take 1 tablet (1,200 mg total) by mouth 2 (two) times daily at 10 AM and 5 PM. 08/27/22   Dorie Rank, MD  MAGNESIUM CITRATE PO Take 250 mg by mouth daily.    [provider]   montelukast (SINGULAIR) 10 MG tablet Take 1 tablet by mouth once daily or as directed. 08/21/21   [name], MD  traMADol Veatrice Bourbon) 50 MG tablet TAKE TWO TABLETS BY MOUTH EVERY 6 HOURS AS NEEDED 03/17/22   [name], MD  Turmeric (QC TUMERIC COMPLEX PO) Take by mouth. With Ginger    [provider]      Allergies    Aleve [naproxen sodium], Aspirin, Ibuprofen, Ampicillin, and Erythromycin    Review of Systems   Review of Systems  Physical Exam Updated Vital Signs BP 136/81   Pulse 82   Temp 98.4 F (36.9 C) (Oral)   Resp 11   SpO2 95%  Physical Exam Vitals and nursing Lawrence reviewed.  Constitutional:      General: She is not in acute distress.    Appearance: She is well-developed. She is not ill-appearing.  HENT:     Head: Normocephalic and atraumatic.  Eyes:     Extraocular Movements: Extraocular movements intact.     Conjunctiva/sclera: Conjunctivae normal.     Pupils: Pupils are equal, round, and reactive to light.  Cardiovascular:     Rate and Rhythm: Normal rate and regular rhythm.     Pulses:          Radial pulses are 2+ on the right side and 2+ on  the left side.     Heart sounds: No murmur heard. Pulmonary:     Effort: Pulmonary effort is normal. No respiratory distress.     Breath sounds: Normal breath sounds. No decreased breath sounds.  Abdominal:     Palpations: Abdomen is soft.     Tenderness: There is no abdominal tenderness.  Musculoskeletal:        General: No swelling. Normal range of motion.     Cervical back: Normal range of motion and neck supple.     Right lower leg: No edema.     Left lower leg: No edema.  Skin:    General: Skin is warm and dry.     Capillary Refill: Capillary refill takes less than 2 seconds.  Neurological:     General: No focal deficit present.     Mental Status: She is alert.  Psychiatric:        Mood and Affect: Mood normal.     ED Results / Procedures / Treatments   Labs (all labs ordered are  listed, but only abnormal results are displayed) Labs Reviewed  COMPREHENSIVE METABOLIC PANEL - Abnormal; Notable for the following components:      Result Value   Glucose, Bld 108 (*)    All other components within normal limits  LIPASE, BLOOD - Abnormal; Notable for the following components:   Lipase <10 (*)    All other components within normal limits  CBC WITH DIFFERENTIAL/PLATELET  TROPONIN I (HIGH SENSITIVITY)    EKG EKG Interpretation  Date/Time:  Saturday September 25 2022 14:12:50 EDT Ventricular Rate:  81 PR Interval:  153 QRS Duration: 80 QT Interval:  370 QTC Calculation: 430 R Axis:   0 Text Interpretation: Sinus rhythm Confirmed by Ronnald Nian, Eudora Guevarra ([id number]) on 09/25/2022 2:56:42 PM  Radiology No results found.  Procedures Procedures    Medications Ordered in ED Medications  ondansetron (ZOFRAN-ODT) disintegrating tablet 4 mg (4 mg Oral Given 09/25/22 1524)    ED Course/ Medical Decision Making/ A&P                             Medical Decision Making Amount and/or Complexity of Data Reviewed Radiology: ordered.  Risk Prescription drug management.   [name] is here with upper abdominal pain, chest pain, nausea vomiting diarrhea.  Overall no significant medical problems except for acid reflux.  Differential diagnosis is likely foodborne illness/viral process seems less likely to be ACS or pneumonia or infectious process.  CBC, CMP, lipase, troponin, chest x-ray and EKG performed and per my review and interpretation there is no significant anemia or electrolyte abnormality or kidney injury.  Troponin is normal.  EKG shows sinus rhythm with no ischemic changes per my review and interpretation.  Chest x-ray without any evidence of pneumonia.  No concern for pancreatitis.  She had her gallbladder removed in the past.  Overall will prescribe Zofran and have her follow-up with primary care doctor.  Have no concern for intra-abdominal infection.  Discharged in  good condition.  Understands return precautions.  This chart was dictated using voice recognition software.  Despite best efforts to proofread,  errors can occur which can change the documentation meaning.         Final Clinical Impression(s) / ED Diagnoses Final diagnoses:  Diarrhea, unspecified type  Nausea and vomiting, unspecified vomiting type    Rx / DC Orders ED Discharge Orders  Ordered    ondansetron (ZOFRAN) 4 MG tablet  Every 6 hours        09/25/22 1543              Lennice Sites, DO 09/25/22 1543

## 2022-09-25 NOTE — ED Notes (Signed)
Dc instructions reviewed with patient. Patient voiced understanding. Dc with belongings.  °

## 2022-09-25 NOTE — ED Triage Notes (Signed)
Pt reports chest pain, nausea, and vomiting x 2 days.

## 2022-09-30 ENCOUNTER — Telehealth: Payer: Self-pay

## 2022-09-30 NOTE — Telephone Encounter (Signed)
        Patient  visited [facility] on 3/16    Telephone encounter attempt :  1st  A HIPAA compliant voice message was left requesting a return call.  Instructed patient to call back     [facility] [phone] [address], [place],  [address] Phone: [phone] Email: [email or web address]

## 2022-10-01 ENCOUNTER — Telehealth: Payer: Self-pay

## 2022-10-01 ENCOUNTER — Other Ambulatory Visit: Payer: Self-pay | Admitting: Family Medicine

## 2022-10-01 DIAGNOSIS — J3081 Allergic rhinitis due to animal (cat) (dog) hair and dander: Secondary | ICD-10-CM | POA: Diagnosis not present

## 2022-10-01 DIAGNOSIS — J301 Allergic rhinitis due to pollen: Secondary | ICD-10-CM | POA: Diagnosis not present

## 2022-10-01 DIAGNOSIS — Z1231 Encounter for screening mammogram for malignant neoplasm of breast: Secondary | ICD-10-CM

## 2022-10-01 DIAGNOSIS — J3089 Other allergic rhinitis: Secondary | ICD-10-CM | POA: Diagnosis not present

## 2022-10-01 NOTE — Telephone Encounter (Signed)
        Patient  visited [facility] on 3/16     Telephone encounter attempt :  2nd  A HIPAA compliant voice message was left requesting a return call.  Instructed patient to call back .    [facility] [phone] [address], [place], [place] [address] Phone: [phone] Email: [email or web address]

## 2022-10-04 ENCOUNTER — Telehealth: Payer: Self-pay

## 2022-10-04 NOTE — Telephone Encounter (Signed)
        Patient  visited [facility] encounter attempt :  3rd  A HIPAA compliant voice message was left requesting a return call.  Instructed patient to call back .   [facility] [phone] [address], [place], [place] [address] Phone: [phone] Email: [email or web address]

## 2022-10-05 DIAGNOSIS — L821 Other seborrheic keratosis: Secondary | ICD-10-CM | POA: Diagnosis not present

## 2022-10-05 DIAGNOSIS — L814 Other melanin hyperpigmentation: Secondary | ICD-10-CM | POA: Diagnosis not present

## 2022-10-05 DIAGNOSIS — D225 Melanocytic nevi of trunk: Secondary | ICD-10-CM | POA: Diagnosis not present

## 2022-10-05 DIAGNOSIS — L309 Dermatitis, unspecified: Secondary | ICD-10-CM | POA: Diagnosis not present

## 2022-10-06 ENCOUNTER — Ambulatory Visit (INDEPENDENT_AMBULATORY_CARE_PROVIDER_SITE_OTHER): Payer: Medicare HMO | Admitting: Family Medicine

## 2022-10-06 VITALS — BP 124/70 | HR 82 | Temp 98.1°F | Wt 165.8 lb

## 2022-10-06 DIAGNOSIS — R1010 Upper abdominal pain, unspecified: Secondary | ICD-10-CM

## 2022-10-06 MED ORDER — ONDANSETRON HCL 8 MG PO TABS: 8.0000 mg | ORAL_TABLET | Freq: Three times a day (TID) | ORAL | 1 refills | Status: DC | PRN

## 2022-10-06 MED ORDER — OMEPRAZOLE 40 MG PO CPDR: 40.0000 mg | DELAYED_RELEASE_CAPSULE | Freq: Two times a day (BID) | ORAL | 1 refills | Status: DC

## 2022-10-06 NOTE — Progress Notes (Signed)
   Subjective:    Patient ID: Aimee Lawrence, female    DOB: [birth year], 77 y.o.   MRN: [id number]  HPI Here to follow up on an ED visit on 09-25-22 for upper abdominal pain, nausea and vomiting, chest pain, and diarrhea. No blood has been seen in the emesis or the stool. No fevers. She says eating food has no effect on the symptoms. No recent travel. Her living partner feels fine. At the ED her exam was normal with no abdominal tenderness. EKG and CXR were clear. All labs were normal including a lipase. She was sent home on Omeprazole 20 mg once daily and prn Zofran. She still feels the same since these symptoms began 2 weeks ago. She is S/P a cholecystectomy.    Review of Systems  Constitutional: Negative.   Respiratory: Negative.    Cardiovascular:  Positive for chest pain.  Gastrointestinal:  Positive for abdominal pain, diarrhea, nausea and vomiting. Negative for abdominal distention, blood in stool and constipation.  Genitourinary: Negative.        Objective:   Physical Exam Constitutional:      Appearance: Normal appearance. She is not ill-appearing.  Cardiovascular:     Rate and Rhythm: Normal rate and regular rhythm.     Pulses: Normal pulses.     Heart sounds: Normal heart sounds.     No friction rub.  Pulmonary:     Effort: Pulmonary effort is normal.     Breath sounds: Normal breath sounds.  Abdominal:     General: Abdomen is flat. Bowel sounds are normal. There is no distension.     Palpations: Abdomen is soft. There is no mass.     Tenderness: There is no right CVA tenderness, left CVA tenderness, guarding or rebound.     Hernia: No hernia is present.     Comments: The epigastrium is slightly tender   Neurological:     Mental Status: She is alert.           Assessment & Plan:  She is having symptoms that suggest peptic ulcer disease. She can use Zofran as needed. We will increase the Omeprazole to 40 mg BID. We will also set up a contrasted CT of the  abdomen and pelvis. We spent a total of ( 35  ) minutes reviewing records and discussing these issues.  Alysia Penna, MD  Alysia Penna, MD

## 2022-10-12 ENCOUNTER — Other Ambulatory Visit: Payer: Self-pay | Admitting: Family Medicine

## 2022-10-12 DIAGNOSIS — J3089 Other allergic rhinitis: Secondary | ICD-10-CM | POA: Diagnosis not present

## 2022-10-12 DIAGNOSIS — J301 Allergic rhinitis due to pollen: Secondary | ICD-10-CM | POA: Diagnosis not present

## 2022-10-12 DIAGNOSIS — J3081 Allergic rhinitis due to animal (cat) (dog) hair and dander: Secondary | ICD-10-CM | POA: Diagnosis not present

## 2022-10-13 ENCOUNTER — Other Ambulatory Visit: Payer: Self-pay

## 2022-10-13 ENCOUNTER — Encounter (HOSPITAL_BASED_OUTPATIENT_CLINIC_OR_DEPARTMENT_OTHER): Payer: Self-pay | Admitting: Physical Therapy

## 2022-10-13 ENCOUNTER — Ambulatory Visit (HOSPITAL_BASED_OUTPATIENT_CLINIC_OR_DEPARTMENT_OTHER): Payer: Medicare HMO | Attending: Family Medicine | Admitting: Physical Therapy

## 2022-10-13 DIAGNOSIS — M6281 Muscle weakness (generalized): Secondary | ICD-10-CM

## 2022-10-13 DIAGNOSIS — M25561 Pain in right knee: Secondary | ICD-10-CM | POA: Diagnosis not present

## 2022-10-13 DIAGNOSIS — M25562 Pain in left knee: Secondary | ICD-10-CM

## 2022-10-13 DIAGNOSIS — R262 Difficulty in walking, not elsewhere classified: Secondary | ICD-10-CM | POA: Diagnosis not present

## 2022-10-13 NOTE — Therapy (Signed)
OUTPATIENT PHYSICAL THERAPY LOWER EXTREMITY EVALUATION   Patient Name: Aimee Lawrence MRN: [id number] DOB:[birth year], 77 y.o., female Today's Date: 10/13/2022  END OF SESSION:  PT End of Session - 10/13/22 1744     Visit Number 1    Number of Visits 13    Date for PT Re-Evaluation 01/11/23    Authorization Type [employer] MCR    PT Start Time [id number]    PT Stop Time 1530    PT Time Calculation (min) 45 min    Activity Tolerance Patient tolerated treatment well;Patient limited by pain    Behavior During Therapy WFL for tasks assessed/performed             Past Medical History:  Diagnosis Date   Allergy    takes Doxycycline daily and uses Flonase daily   Arthritis    Asthma    Albuterol inhaler as needed.   Dyspnea    rarely and sitting   GERD (gastroesophageal reflux disease)    not on any meds   History of bronchitis    History of shingles    Hx of colonic polyps    benign   Joint pain    Joint swelling    Osteopenia    takes Vit D3 and Calcium daily   Primary localized osteoarthritis of right knee    Past Surgical History:  Procedure Laterality Date   APPENDECTOMY     CHOLECYSTECTOMY  2007   lap choli   COLONOSCOPY  10/22/2015   per Dr. Carlean Purl, no polyps, repeat in 5 yrs    KNEE CLOSED REDUCTION Right 09/14/2016   Procedure: CLOSED MANIPULATION KNEE;  Surgeon: Melrose Nakayama, MD;  Location: [facility];  Service: Orthopedics;  Laterality: Right;   NASAL POLYP SURGERY  2007   SINUS ENDO W/FUSION  06/27/2012   Procedure: ENDOSCOPIC SINUS SURGERY WITH FUSION NAVIGATION;  Surgeon: Rozetta Nunnery, MD;  Location: [facility];  Service: ENT;  Laterality: N/A;  FESS FUSION PROTOCOL   TOTAL KNEE ARTHROPLASTY Right 06/15/2016   Procedure: RIGHT TOTAL KNEE ARTHROPLASTY;  Surgeon: Melrose Nakayama, MD;  Location: [facility];  Service: Orthopedics;  Laterality: Right;   Patient Active Problem List   Diagnosis Date Noted   Abnormal finding on imaging 08/29/2017    Right hip pain 08/15/2017   S/P TKR (total knee replacement), right 08/11/2017   Osteoarthritis of hip 08/11/2017   Primary localized osteoarthritis of right knee 06/15/2016   Primary osteoarthritis of right knee 06/15/2016   Nasal polyps 11/20/2012   Essential hypertension 05/28/2010   DEPRESSION 02/28/2007   Asthma 02/28/2007   Osteopenia 02/28/2007   Personal hx colon polyps 02/28/2007    PCP:  [name], MD     REFERRING PROVIDER:  [name], MD     REFERRING DIAG:  Diagnosis  M25.561,M25.562,G89.29 (ICD-10-CM) - Bilateral chronic knee pain    THERAPY DIAG:  Pain in joint of right knee  Pain in joint of left knee  Difficulty walking  Muscle weakness (generalized)  Rationale for Evaluation and Treatment: Rehabilitation  ONSET DATE: 2018  SUBJECTIVE:   SUBJECTIVE STATEMENT: Pt states she is here bc of her knee pain. Her R knee has been painful since her TKA in 2018. The L needs surgery at this point too but she wants to prolong this. Pt states she thought she was supposed to see a pain specialist. However, she is willing to try physical therapy. Requires UE for stairs. She has pain all the time  but any ADL is painful. She states she is now having new hip pain that she thinks it might be due to how she is walking. She was evaluated for her hips about 5 years ago and was suggested to get a THA at the time as well. Pt denies NT. Pt states that the R knee will buckle and occasionally go weak.  PERTINENT HISTORY: osteopenia, history of bronchitis and asthma, 08/12/20 right knee ablation, multiple knee injections, history of tka R 12/17 with manipulation on 3/18, hard of hearing,   PAIN:  Are you having pain? Yes: NPRS scale: 3/10 Pain location: R anterior knee; L and ant posterior knee Pain description: pressure, sharp Aggravating factors: standing, stairs, bending,  Relieving factors: rest   PRECAUTIONS: Fall   WEIGHT BEARING RESTRICTIONS No    FALLS:  Has patient fallen in last 6 months? No   LIVING ENVIRONMENT: Lives with: lives alone   Lives in: House Stairs: Yes: Internal: 10 steps; on left going up and External: 4 steps; she doesn't have to go up the stairs in the house Has following equipment at home: Single point cane uses occasionally, also has quad cane, grabber and sock donner   OCCUPATION: not currently working;   Hobbiesenjoys working in the garden    PLOF: Independent   PATIENT GOALS : prolong/prevent L knee surgery; be able to bend over and pick up things; perform ADLs efficiently      OBJECTIVE:    DIAGNOSTIC FINDINGS: .  FINDINGS: Mild tricompartmental degenerative changes. No fractures or dislocations. No joint effusion. No other abnormalities.   IMPRESSION: Tricompartmental degenerative changes as above.       COGNITION:           Overall cognitive status: Within functional limits for tasks assessed                          SENSATION: WFL     POSTURE:  Fwd flexed posture, increased kyphosis, flexed knee standing, toe out   PALPATION: TTP of bilat knee joint line; hypertonicity and limited scar mobility of R knee              MMT Right 4/3 Left 4/3  Hip flexion    Hip extension    Hip abduction 4/5 4/5  Hip adduction    Hip internal rotation    Hip external rotation    Knee flexion 17.1 33.6  Knee extension 38.3 45.2  Ankle dorsiflexion    Ankle plantarflexion    Ankle inversion    Ankle eversion     (Blank rows = not tested)  AROM Right 4/3 Left 4/3  Hip flexion    Hip extension    Hip abduction    Hip adduction    Hip internal rotation 25 25  Hip external rotation 30 30  Knee flexion 75 117  Knee extension -7 1  Ankle dorsiflexion 0 0  Ankle plantarflexion    Ankle inversion    Ankle eversion     (Blank rows = not tested)    FUNCTIONAL TESTS:  STS: requires UE, R knee goes into extension and WB is primarily on the L Step up/down: lack of eccentric control  with descent, toe out, dynamic valgus   GAIT: Distance walked: 50 feet Assistive device utilized: SPC Level of assistance: Mod I Comments: toe out, decreased step length, [place] bilat, shortened step length       TODAY'S TREATMENT:    Exercises  -  Standing Knee Flexion Stretch on Step 3 reps - 30 hold  - Prone Quadriceps Stretch with Strap  -R side only 3 reps - 30 hold  - RTB Terminal Knee Extension with Resistance  -2 sets - 10 reps        PATIENT EDUCATION:  Education details: MOI, diagnosis, prognosis, anatomy, exercise progression, DOMS expectations, muscle firing,  envelope of function, HEP, POC  Person educated: Patient Education method: Explanation, Demonstration, and Handouts Education comprehension: verbalized understanding, further education needed       HOME EXERCISE PROGRAM: Access Code: [email or web address] URL: [email or web address] Date: 10/13/2022 Prepared by: Daleen Bo   ASSESSMENT:   CLINICAL IMPRESSION: Patient is a 77 y.o. female who was seen today for physical therapy evaluation and treatment for c/c of bilat knee pain L>R. Pt's s/s appear consistent with overuse type pain of the L due to R TKA that did not achieve functional levels of strength or knee ROM. Pt's pain is moderately sensitive and irritable with movement. Pt likely to benefit more from aquatic environment to start due to lack of functional mobility and capacity on land. Plan to continue with bilat knee strength and R ROM at future sessions. Pt would benefit from continued skilled therapy in order to reach goals and maximize functional bilat LE strength for prevention of further functional decline and prolonging future surgery.      OBJECTIVE IMPAIRMENTS Abnormal gait, decreased activity tolerance, decreased balance, decreased knowledge of use of DME, hypomobility, muscle spasm, decreased mobility, difficulty walking, decreased ROM, decreased strength, postural dysfunction, and  pain.    ACTIVITY LIMITATIONS cleaning, community activity, meal prep, and yard work.    PERSONAL FACTORS Age, Fitness, Time since onset of injury/illness/exacerbation, and 3+ comorbidities:  are also affecting patient's functional outcome.     REHAB POTENTIAL: Fair given right knee history and time since manipulation   CLINICAL DECISION MAKING: Evolving/moderate complexity   EVALUATION COMPLEXITY: Moderate     GOALS:  SHORT TERM GOALS:Target date:11/24/2022    Patient will be independent in self management strategies to improve quality of life and functional outcomes.  Goal status: INITIAL   2. Pt will score at least 19 pt increase on FOTO to demonstrate functional improvement in MCII and pt perceived function.    Goal status: INITIAL   3. Pt will be able to demonstrate STS with equal WB and UE support in order to demonstrate functional improvement in LE function for self-care and house hold duties.   Goal status: INITIAL           LONG TERM GOALS:01/05/2023    1.Pt  will become independent with final HEP in order to demonstrate synthesis of PT education.   Goal status: INITIAL    2.  Pt will score >/= 53 on FOTO to demonstrate improvement in perceived bilat knee function. .   Goal status: INITIAL   3.  Patient will be able walk up the steps with reciprocating gait to improve ability to go up stairs at home safely and effectively.  Goal status: INITIAL       PLAN: PT FREQUENCY: 1-2/week   PT DURATION: 12 weeks ( DC by 10wks likely)   PLANNED INTERVENTIONS: Therapeutic exercises, Therapeutic activity, Neuromuscular re-education, Balance training, Gait training, Patient/Family education, Joint mobilization, Stair training, Visual/preceptual remediation/compensation, Dry Needling, Electrical stimulation, Spinal mobilization, Cryotherapy, Moist heat, Traction, Ultrasound, and Manual therapy   PLAN FOR NEXT SESSION: aquatic for knee ROM and strength bilat; hip  strength, set up aquaticHEP  Daleen Bo, PT 10/13/2022, 6:09 PM

## 2022-10-13 NOTE — Telephone Encounter (Signed)
Last refill-03/17/22--180 tabs, 1 refill Last OV-10/06/22  No future OV scheduled.

## 2022-10-22 ENCOUNTER — Ambulatory Visit
Admission: RE | Admit: 2022-10-22 | Discharge: 2022-10-22 | Disposition: A | Discharge status: Home / Self Care | Payer: Medicare HMO | Source: Ambulatory Visit | Attending: Family Medicine | Admitting: Family Medicine

## 2022-10-22 DIAGNOSIS — N281 Cyst of kidney, acquired: Secondary | ICD-10-CM | POA: Diagnosis not present

## 2022-10-22 DIAGNOSIS — M160 Bilateral primary osteoarthritis of hip: Secondary | ICD-10-CM | POA: Diagnosis not present

## 2022-10-22 DIAGNOSIS — N200 Calculus of kidney: Secondary | ICD-10-CM | POA: Diagnosis not present

## 2022-10-22 DIAGNOSIS — R1010 Upper abdominal pain, unspecified: Secondary | ICD-10-CM

## 2022-10-22 DIAGNOSIS — K529 Noninfective gastroenteritis and colitis, unspecified: Secondary | ICD-10-CM | POA: Diagnosis not present

## 2022-10-22 MED ORDER — IOPAMIDOL (ISOVUE-300) INJECTION 61%
100.0000 mL | Freq: Once | INTRAVENOUS | Status: AC | PRN
  Administered 2022-10-22: 100 mL via INTRAVENOUS

## 2022-10-26 DIAGNOSIS — J301 Allergic rhinitis due to pollen: Secondary | ICD-10-CM | POA: Diagnosis not present

## 2022-10-26 DIAGNOSIS — J3081 Allergic rhinitis due to animal (cat) (dog) hair and dander: Secondary | ICD-10-CM | POA: Diagnosis not present

## 2022-10-26 DIAGNOSIS — J3089 Other allergic rhinitis: Secondary | ICD-10-CM | POA: Diagnosis not present

## 2022-10-29 ENCOUNTER — Encounter: Payer: Self-pay | Admitting: Family Medicine

## 2022-10-29 ENCOUNTER — Ambulatory Visit (INDEPENDENT_AMBULATORY_CARE_PROVIDER_SITE_OTHER): Payer: Medicare HMO | Admitting: Family Medicine

## 2022-10-29 VITALS — BP 120/74 | HR 78 | Temp 98.2°F | Wt 162.4 lb

## 2022-10-29 DIAGNOSIS — R1010 Upper abdominal pain, unspecified: Secondary | ICD-10-CM | POA: Diagnosis not present

## 2022-10-29 DIAGNOSIS — I700 Atherosclerosis of aorta: Secondary | ICD-10-CM | POA: Diagnosis not present

## 2022-10-29 MED ORDER — ATORVASTATIN CALCIUM 10 MG PO TABS: 10.0000 mg | ORAL_TABLET | Freq: Every day | ORAL | 3 refills | Status: DC

## 2022-10-29 NOTE — Progress Notes (Signed)
   Subjective:    Patient ID: Aimee Lawrence, female    DOB: 06-29-1946, 77 y.o.   MRN: [id number]  HPI Here to follow up on upper abdominal pain and other issues. At our last visit we increased her Omeprazole to 40 mg BID, and since then she has improved quite a bit. She still has occasional pains byut they are much lighter, and the diarrhea has almost stopped. She had a CT scan on  10-22-22 which did not reveal any cause for the pain, but it showed some other issues that she wants to discuss. It showed a small left renal stone, and I advised her this will probably never bother her. It showed some parapelvic renal cysts, and I explained these are benign. It also showed aortic atherosclerosis. She has never had any sort of cardiac testing. She has never had chest pain or SOB. She has never smoked. She does not have diabetes, and her HTN is well controlled.    Review of Systems  Constitutional: Negative.   Respiratory: Negative.    Cardiovascular: Negative.   Gastrointestinal:  Positive for abdominal pain and diarrhea. Negative for abdominal distention, blood in stool, constipation, nausea and vomiting.       Objective:   Physical Exam Constitutional:      Appearance: Normal appearance.  Cardiovascular:     Rate and Rhythm: Normal rate and regular rhythm.     Pulses: Normal pulses.     Heart sounds: Normal heart sounds.  Pulmonary:     Effort: Pulmonary effort is normal.     Breath sounds: Normal breath sounds.  Abdominal:     General: Abdomen is flat. Bowel sounds are normal. There is no distension.     Palpations: Abdomen is soft. There is no mass.     Tenderness: There is no abdominal tenderness. There is no guarding or rebound.     Hernia: No hernia is present.  Neurological:     Mental Status: She is alert.           Assessment & Plan:  Her upper abdominal pain is responding to the acid suppression, so this is consistent with duodenitis. She will stay on the  Omeprazole. I went over the benign findings on her CT scan as above, but with the finding of aortic atherosclerosis, we will start her on Lipitor 10 mg daily. Also, at her request we will set up a cardiac calcium CT scoring test to screen for CAD. Gershon Crane, MD

## 2022-11-05 DIAGNOSIS — J3081 Allergic rhinitis due to animal (cat) (dog) hair and dander: Secondary | ICD-10-CM | POA: Diagnosis not present

## 2022-11-05 DIAGNOSIS — J3089 Other allergic rhinitis: Secondary | ICD-10-CM | POA: Diagnosis not present

## 2022-11-05 DIAGNOSIS — J301 Allergic rhinitis due to pollen: Secondary | ICD-10-CM | POA: Diagnosis not present

## 2022-11-08 ENCOUNTER — Encounter: Payer: Self-pay | Admitting: Family Medicine

## 2022-11-08 ENCOUNTER — Ambulatory Visit (INDEPENDENT_AMBULATORY_CARE_PROVIDER_SITE_OTHER): Payer: Medicare HMO | Admitting: Family Medicine

## 2022-11-08 VITALS — BP 120/80 | HR 93 | Temp 98.2°F | Wt 158.0 lb

## 2022-11-08 DIAGNOSIS — A084 Viral intestinal infection, unspecified: Secondary | ICD-10-CM

## 2022-11-08 DIAGNOSIS — R197 Diarrhea, unspecified: Secondary | ICD-10-CM | POA: Diagnosis not present

## 2022-11-08 NOTE — Progress Notes (Unsigned)
   Subjective:    Patient ID: Aimee Lawrence, female    DOB: 1946-02-12, 77 y.o.   MRN: [id number]  HPI Here for 3 days of nausea with vomiting, lower abdominal cramps, and diarrhea. No fever. No recent travel. Yesterday she began to feel better, and today she feels almost back to normal. She had cough or ST.    Review of Systems  Constitutional: Negative.   HENT: Negative.    Eyes: Negative.   Respiratory: Negative.    Cardiovascular: Negative.   Gastrointestinal:  Positive for abdominal pain, diarrhea, nausea and vomiting. Negative for abdominal distention, blood in stool, constipation and rectal pain.  Genitourinary: Negative.        Objective:   Physical Exam Constitutional:      Appearance: Normal appearance. She is not ill-appearing.  Cardiovascular:     Rate and Rhythm: Normal rate and regular rhythm.     Pulses: Normal pulses.     Heart sounds: Normal heart sounds.  Pulmonary:     Effort: Pulmonary effort is normal.     Breath sounds: Normal breath sounds.  Abdominal:     General: Abdomen is flat. Bowel sounds are normal. There is no distension.     Palpations: Abdomen is soft. There is no mass.     Tenderness: There is no abdominal tenderness. There is no guarding or rebound.     Hernia: No hernia is present.  Neurological:     Mental Status: She is alert.           Assessment & Plan:  Viral enteritis. She is over the worst of it, and she is almost back to normal. She will drink plenty of fluids and advance her diet as tolerated.  Gershon Crane, MD

## 2022-11-09 ENCOUNTER — Ambulatory Visit (HOSPITAL_BASED_OUTPATIENT_CLINIC_OR_DEPARTMENT_OTHER): Payer: Medicare HMO | Admitting: Physical Therapy

## 2022-11-09 LAB — POC COVID19 BINAXNOW: SARS Coronavirus 2 Ag: NEGATIVE

## 2022-11-10 ENCOUNTER — Other Ambulatory Visit (HOSPITAL_BASED_OUTPATIENT_CLINIC_OR_DEPARTMENT_OTHER): Payer: Self-pay | Admitting: Orthopaedic Surgery

## 2022-11-10 ENCOUNTER — Ambulatory Visit (INDEPENDENT_AMBULATORY_CARE_PROVIDER_SITE_OTHER): Payer: Medicare HMO

## 2022-11-10 ENCOUNTER — Ambulatory Visit (HOSPITAL_BASED_OUTPATIENT_CLINIC_OR_DEPARTMENT_OTHER): Payer: Medicare HMO | Admitting: Orthopaedic Surgery

## 2022-11-10 ENCOUNTER — Encounter (HOSPITAL_BASED_OUTPATIENT_CLINIC_OR_DEPARTMENT_OTHER): Payer: Self-pay | Admitting: Orthopaedic Surgery

## 2022-11-10 ENCOUNTER — Other Ambulatory Visit: Payer: Self-pay

## 2022-11-10 DIAGNOSIS — M25562 Pain in left knee: Secondary | ICD-10-CM | POA: Diagnosis not present

## 2022-11-10 DIAGNOSIS — M25552 Pain in left hip: Secondary | ICD-10-CM

## 2022-11-10 DIAGNOSIS — G8929 Other chronic pain: Secondary | ICD-10-CM | POA: Diagnosis not present

## 2022-11-10 DIAGNOSIS — M1612 Unilateral primary osteoarthritis, left hip: Secondary | ICD-10-CM | POA: Diagnosis not present

## 2022-11-10 MED ORDER — LIDOCAINE HCL 1 % IJ SOLN
4.0000 mL | INTRAMUSCULAR | Status: AC | PRN
  Administered 2022-11-10: 4 mL

## 2022-11-10 MED ORDER — TRIAMCINOLONE ACETONIDE 40 MG/ML IJ SUSP
80.0000 mg | INTRAMUSCULAR | Status: AC | PRN
  Administered 2022-11-10: 80 mg via INTRA_ARTICULAR

## 2022-11-10 NOTE — Progress Notes (Signed)
Chief Complaint: Left hip pain, left knee pain     History of Present Illness:    Aimee Lawrence is a 77 y.o. female presents with persistent left hip pain and left knee pain for the last several years.  She does have a history of a right total knee arthroplasty done in 2018 by Dr. Jerl Santos.  She has been having a hard time rehabbing this right side as result of pain on the left side.  She does enjoy being active and has been doing physical therapy and plans to start aquatic therapy.  She does take tramadol for pain.  She is not able to take NSAIDs.  Her left hip has been bothering her particularly with motion in terms of being able to garden.  She is quite friendly.    Surgical History:   Right total knee arthroplasty by Dr. Jerl Santos in 2018  PMH/PSH/Family History/Social History/Meds/Allergies:    Past Medical History:  Diagnosis Date  . Allergy    takes Doxycycline daily and uses Flonase daily  . Arthritis   . Asthma    Albuterol inhaler as needed.  Marland Kitchen Dyspnea    rarely and sitting  . GERD (gastroesophageal reflux disease)    not on any meds  . History of bronchitis   . History of shingles   . Hx of colonic polyps    benign  . Joint pain   . Joint swelling   . Osteopenia    takes Vit D3 and Calcium daily  . Primary localized osteoarthritis of right knee    Past Surgical History:  Procedure Laterality Date  . APPENDECTOMY    . CHOLECYSTECTOMY  2007   lap choli  . COLONOSCOPY  10/22/2015   per Dr. Leone Payor, no polyps, repeat in 5 yrs   . KNEE CLOSED REDUCTION Right 09/14/2016   Procedure: CLOSED MANIPULATION KNEE;  Surgeon: Marcene Corning, MD;  Location: [facility];  Service: Orthopedics;  Laterality: Right;  . NASAL POLYP SURGERY  2007  . SINUS ENDO W/FUSION  06/27/2012   Procedure: ENDOSCOPIC SINUS SURGERY WITH FUSION NAVIGATION;  Surgeon: Drema Halon, MD;  Location: [facility];  Service: ENT;  Laterality:  N/A;  FESS FUSION PROTOCOL  . TOTAL KNEE ARTHROPLASTY Right 06/15/2016   Procedure: RIGHT TOTAL KNEE ARTHROPLASTY;  Surgeon: Marcene Corning, MD;  Location: [facility];  Service: Orthopedics;  Laterality: Right;   Social History   Socioeconomic History  . Marital status: Single    Spouse name: Not on file  . Number of children: 0  . Years of education: Not on file  . Highest education level: Master's degree (e.g., MA, MS, MEng, MEd, MSW, MBA)  Occupational History  . Occupation: Retired Runner, broadcasting/film/video  Tobacco Use  . Smoking status: Never  . Smokeless tobacco: Never  Vaping Use  . Vaping Use: Never used  Substance and Sexual Activity  . Alcohol use: No    Alcohol/week: 0.0 standard drinks of alcohol    Comment: no etoh   . Drug use: No  . Sexual activity: Not on file  Other Topics Concern  . Not on file  Social History Narrative   Retired   Single   Regular exercise- no         Social Determinants of Corporate investment banker Strain: Low  Risk  (10/06/2022)   Overall Financial Resource Strain (CARDIA)   . Difficulty of Paying Living Expenses: Not hard at all  Food Insecurity: No Food Insecurity (10/06/2022)   Hunger Vital Sign   . Worried About Programme researcher, broadcasting/film/video in the Last Year: Never true   . Ran Out of Food in the Last Year: Never true  Transportation Needs: Unknown (10/06/2022)   PRAPARE - Transportation   . Lack of Transportation (Medical): No   . Lack of Transportation (Non-Medical): Not on file  Physical Activity: Unknown (10/06/2022)   Exercise Vital Sign   . Days of Exercise per Week: 0 days   . Minutes of Exercise per Session: Not on file  Stress: No Stress Concern Present (10/06/2022)   [employer] of Occupational Health - Occupational Stress Questionnaire   . Feeling of Stress : Not at all  Social Connections: Unknown (10/06/2022)   Social Connection and Isolation Panel [NHANES]   . Frequency of Communication with Friends and Family: More than three times a  week   . Frequency of Social Gatherings with Friends and Family: More than three times a week   . Attends Religious Services: Patient declined   . Active Member of Clubs or Organizations: No   . Attends Banker Meetings: Not on file   . Marital Status: Patient declined   Family History  Problem Relation Age of Onset  . Breast cancer Mother   . Pneumonia Mother   . AAA (abdominal aortic aneurysm) Father   . Alcohol abuse Other   . Arthritis Other   . Diabetes Other   . Heart disease Other   . Colon cancer Neg Hx   . Stomach cancer Neg Hx    Allergies  Allergen Reactions  . Aleve [Naproxen Sodium] Shortness Of Breath  . Aspirin Shortness Of Breath    Trouble breathing  . Ibuprofen Shortness Of Breath    Short of breath  . Ampicillin Rash     Has patient had a PCN reaction causing immediate rash, facial/tongue/throat swelling, SOB or lightheadedness with hypotension:   # # NO # #  Has patient had a PCN reaction that required hospitalization    # # NO # #  Has patient had a PCN reaction occurring within the last 10 years:    # # NO # #  If all of the above answers are "NO", then may proceed with Cephalosporin use.   . Erythromycin Nausea And Vomiting   Current Outpatient Medications  Medication Sig Dispense Refill  . acetaminophen (TYLENOL) 500 MG tablet Take 500 mg by mouth every 6 (six) hours as needed for moderate pain.    Marland Kitchen albuterol (VENTOLIN HFA) 108 (90 Base) MCG/ACT inhaler Inhale 1-2 puffs into the lungs every 6 (six) hours as needed for wheezing or shortness of breath. 8.5 g 0  . atorvastatin (LIPITOR) 10 MG tablet Take 1 tablet (10 mg total) by mouth daily. 90 tablet 3  . Calcium Carbonate-Vitamin D (CALCIUM 500/D PO) Take 2 tablets by mouth daily. 500 mg-200 mg    . Cholecalciferol (VITAMIN D3) 2000 units capsule Take 4,000 Units by mouth daily.     Marland Kitchen EPINEPHrine 0.3 mg/0.3 mL IJ SOAJ injection     . Famotidine (ACID CONTROLLER PO) Take by mouth daily.     Marland Kitchen FIBER SELECT GUMMIES PO Take 1 tablet by mouth daily.    . fluticasone (FLONASE) 50 MCG/ACT nasal spray Place 1 spray into both nostrils daily.     Marland Kitchen  Guaifenesin 1200 MG TB12 Take 1 tablet (1,200 mg total) by mouth 2 (two) times daily at 10 AM and 5 PM. 14 tablet 0  . MAGNESIUM CITRATE PO Take 250 mg by mouth daily.    . montelukast (SINGULAIR) 10 MG tablet Take 1 tablet by mouth once daily or as directed. 90 tablet 3  . omeprazole (PRILOSEC) 40 MG capsule Take 1 capsule (40 mg total) by mouth 2 (two) times daily with a meal. 60 capsule 1  . ondansetron (ZOFRAN) 8 MG tablet Take 1 tablet (8 mg total) by mouth every 8 (eight) hours as needed for nausea or vomiting. 60 tablet 1  . traMADol (ULTRAM) 50 MG tablet TAKE 2 TABLETS BY MOUTH EVERY 6 HOURS AS NEEDED 180 tablet 1  . Turmeric (QC TUMERIC COMPLEX PO) Take by mouth. With Ginger     No current facility-administered medications for this visit.   No results found.  Review of Systems:   A ROS was performed including pertinent positives and negatives as documented in the HPI.  Physical Exam :   Constitutional: NAD and appears stated age Neurological: Alert and oriented Psych: Appropriate affect and cooperative There were no vitals taken for this visit.   Comprehensive Musculoskeletal Exam:    Left hip with essentially 0 degrees of internal/external rotation with 90 degrees of flexion causing pain.  Pain is involving the femoral acetabular joint.  Range of motion about the left knee is from 0 to 130 degrees with medial lateral and patellofemoral joint line pain.  Significant crepitus with both  Imaging:   Xray (3 views left hip, 4 views left knee): Advanced femoral acetabular arthritis of the left hip, mild osteoarthritis of the left knee   I personally reviewed and interpreted the radiographs.   Assessment:   76 y.o. female with significant left hip advanced arthritis as well as mild left knee osteoarthritis.  At this time I do  believe that her left hip is likely causing more pain and stress throughout the left knee as well as the right knee.  I do believe that given the severity of her arthritis that she may benefit from a left hip arthroplasty.  To that effect I will plan to refer her to Dr. Magnus Ivan for discussion of this.  I would also like to provide a left knee ultrasound-guided injection with steroid to hopefully get her some relief in the meantime  Plan :    -Plan for left hip referral to Dr. Magnus Ivan for discussion of arthroplasty -Left knee ultrasound-guided injection performed after verbal consent obtained    Procedure Note  Patient: Aimee Lawrence             Date of Birth: June 16, 1946           MRN: [id number]             Visit Date: 11/10/2022  Procedures: Visit Diagnoses:  1. Pain of left hip   2. Chronic pain of left knee     Large Joint Inj: L knee on 11/10/2022 3:10 PM Indications: pain Details: 22 G 1.5 in needle, ultrasound-guided anterior approach  Arthrogram: No  Medications: 4 mL lidocaine 1 %; 80 mg triamcinolone acetonide 40 MG/ML Outcome: tolerated well, no immediate complications Procedure, treatment alternatives, risks and benefits explained, specific risks discussed. Consent was given by the patient. Immediately prior to procedure a time out was called to verify the correct patient, procedure, equipment, support staff and site/side marked as required. Patient was prepped and  draped in the usual sterile fashion.          I personally saw and evaluated the patient, and participated in the management and treatment plan.  Huel Cote, MD Attending Physician, Orthopedic Surgery  This document was dictated using Dragon voice recognition software. A reasonable attempt at proof reading has been made to minimize errors.

## 2022-11-12 ENCOUNTER — Ambulatory Visit (HOSPITAL_BASED_OUTPATIENT_CLINIC_OR_DEPARTMENT_OTHER): Payer: Medicare HMO | Attending: Family Medicine | Admitting: Physical Therapy

## 2022-11-12 ENCOUNTER — Encounter (HOSPITAL_BASED_OUTPATIENT_CLINIC_OR_DEPARTMENT_OTHER): Payer: Self-pay | Admitting: Physical Therapy

## 2022-11-12 DIAGNOSIS — M6281 Muscle weakness (generalized): Secondary | ICD-10-CM | POA: Insufficient documentation

## 2022-11-12 DIAGNOSIS — J301 Allergic rhinitis due to pollen: Secondary | ICD-10-CM | POA: Diagnosis not present

## 2022-11-12 DIAGNOSIS — M25562 Pain in left knee: Secondary | ICD-10-CM | POA: Diagnosis not present

## 2022-11-12 DIAGNOSIS — J3089 Other allergic rhinitis: Secondary | ICD-10-CM | POA: Diagnosis not present

## 2022-11-12 DIAGNOSIS — M25561 Pain in right knee: Secondary | ICD-10-CM | POA: Insufficient documentation

## 2022-11-12 DIAGNOSIS — J3081 Allergic rhinitis due to animal (cat) (dog) hair and dander: Secondary | ICD-10-CM | POA: Diagnosis not present

## 2022-11-12 DIAGNOSIS — R262 Difficulty in walking, not elsewhere classified: Secondary | ICD-10-CM | POA: Insufficient documentation

## 2022-11-12 NOTE — Therapy (Signed)
OUTPATIENT PHYSICAL THERAPY LOWER EXTREMITY EVALUATION   Patient Name: Aimee Lawrence MRN: [id number] DOB:29-May-1946, 77 y.o., female Today's Date: 11/12/2022  END OF SESSION:  PT End of Session - 11/12/22 0856     Visit Number 2    Number of Visits 13    Date for PT Re-Evaluation 01/11/23    PT Start Time 0900    PT Stop Time 0945    PT Time Calculation (min) 45 min    Activity Tolerance Patient tolerated treatment well    Behavior During Therapy [facility] for tasks assessed/performed             Past Medical History:  Diagnosis Date   Allergy    takes Doxycycline daily and uses Flonase daily   Arthritis    Asthma    Albuterol inhaler as needed.   Dyspnea    rarely and sitting   GERD (gastroesophageal reflux disease)    not on any meds   History of bronchitis    History of shingles    Hx of colonic polyps    benign   Joint pain    Joint swelling    Osteopenia    takes Vit D3 and Calcium daily   Primary localized osteoarthritis of right knee    Past Surgical History:  Procedure Laterality Date   APPENDECTOMY     CHOLECYSTECTOMY  2007   lap choli   COLONOSCOPY  10/22/2015   per Dr. Leone Payor, no polyps, repeat in 5 yrs    KNEE CLOSED REDUCTION Right 09/14/2016   Procedure: CLOSED MANIPULATION KNEE;  Surgeon: Marcene Corning, MD;  Location: [facility] OR;  Service: Orthopedics;  Laterality: Right;   NASAL POLYP SURGERY  2007   SINUS ENDO W/FUSION  06/27/2012   Procedure: ENDOSCOPIC SINUS SURGERY WITH FUSION NAVIGATION;  Surgeon: Drema Halon, MD;  Location: [facility];  Service: ENT;  Laterality: N/A;  FESS FUSION PROTOCOL   TOTAL KNEE ARTHROPLASTY Right 06/15/2016   Procedure: RIGHT TOTAL KNEE ARTHROPLASTY;  Surgeon: Marcene Corning, MD;  Location: [facility] OR;  Service: Orthopedics;  Laterality: Right;   Patient Active Problem List   Diagnosis Date Noted   Aortic atherosclerosis (HCC) 10/29/2022   Abnormal finding on imaging 08/29/2017   Right hip  pain 08/15/2017   S/P TKR (total knee replacement), right 08/11/2017   Osteoarthritis of hip 08/11/2017   Primary localized osteoarthritis of right knee 06/15/2016   Primary osteoarthritis of right knee 06/15/2016   Nasal polyps 11/20/2012   Essential hypertension 05/28/2010   DEPRESSION 02/28/2007   Asthma 02/28/2007   Osteopenia 02/28/2007   Personal hx colon polyps 02/28/2007    PCP:  Nelwyn Salisbury, MD     REFERRING PROVIDER:  Nelwyn Salisbury, MD     REFERRING DIAG:  Diagnosis  M25.561,M25.562,G89.29 (ICD-10-CM) - Bilateral chronic knee pain    THERAPY DIAG:  Pain in joint of right knee  Pain in joint of left knee  Difficulty walking  Muscle weakness (generalized)  Rationale for Evaluation and Treatment: Rehabilitation  ONSET DATE: 2018  SUBJECTIVE:   SUBJECTIVE STATEMENT: "Saw Dr Steward Drone wed and he said it is my left hip that is bad need a hip replacement.  He is sending me to Dr Magnus Ivan"   Pt states she is here bc of her knee pain. Her R knee has been painful since her TKA in 2018. The L needs surgery at this point too but she wants to prolong this. Pt states she thought she  was supposed to see a pain specialist. However, she is willing to try physical therapy. Requires UE for stairs. She has pain all the time but any ADL is painful. She states she is now having new hip pain that she thinks it might be due to how she is walking. She was evaluated for her hips about 5 years ago and was suggested to get a THA at the time as well. Pt denies NT. Pt states that the R knee will buckle and occasionally go weak.  PERTINENT HISTORY: osteopenia, history of bronchitis and asthma, 08/12/20 right knee ablation, multiple knee injections, history of tka R 12/17 with manipulation on 3/18, hard of hearing,   PAIN:  Are you having pain? Yes: NPRS scale: 3/10 Pain location: R anterior knee; L and ant posterior knee Pain description: pressure, sharp Aggravating factors:  standing, stairs, bending,  Relieving factors: rest   PRECAUTIONS: Fall   WEIGHT BEARING RESTRICTIONS No   FALLS:  Has patient fallen in last 6 months? No   LIVING ENVIRONMENT: Lives with: lives alone   Lives in: House Stairs: Yes: Internal: 10 steps; on left going up and External: 4 steps; she doesn't have to go up the stairs in the house Has following equipment at home: Single point cane uses occasionally, also has quad cane, grabber and sock donner   OCCUPATION: not currently working;   Hobbiesenjoys working in the garden    PLOF: Independent   PATIENT GOALS : prolong/prevent L knee surgery; be able to bend over and pick up things; perform ADLs efficiently      OBJECTIVE:    DIAGNOSTIC FINDINGS: .  FINDINGS: Mild tricompartmental degenerative changes. No fractures or dislocations. No joint effusion. No other abnormalities.   IMPRESSION: Tricompartmental degenerative changes as above.       COGNITION:           Overall cognitive status: Within functional limits for tasks assessed                          SENSATION: WFL     POSTURE:  Fwd flexed posture, increased kyphosis, flexed knee standing, toe out   PALPATION: TTP of bilat knee joint line; hypertonicity and limited scar mobility of R knee              MMT Right 4/3 Left 4/3  Hip flexion    Hip extension    Hip abduction 4/5 4/5  Hip adduction    Hip internal rotation    Hip external rotation    Knee flexion 17.1 33.6  Knee extension 38.3 45.2  Ankle dorsiflexion    Ankle plantarflexion    Ankle inversion    Ankle eversion     (Blank rows = not tested)  AROM Right 4/3 Left 4/3  Hip flexion    Hip extension    Hip abduction    Hip adduction    Hip internal rotation 25 25  Hip external rotation 30 30  Knee flexion 75 117  Knee extension -7 1  Ankle dorsiflexion 0 0  Ankle plantarflexion    Ankle inversion    Ankle eversion     (Blank rows = not tested)    FUNCTIONAL TESTS:   STS: requires UE, R knee goes into extension and WB is primarily on the L Step up/down: lack of eccentric control with descent, toe out, dynamic valgus   GAIT: Distance walked: 50 feet Assistive device utilized: SPC Level of assistance:  Mod I Comments: toe out, decreased step length, [place] bilat, shortened step length       TODAY'S TREATMENT: Pt seen for aquatic therapy today.  Treatment took place in water 3.5-4.75 ft in depth at the [facility] pool. Temp of water was 91.  Pt entered/exited the pool via stair using step to pattern with bilateral hand rail.  *intro to setting *UE support wall: df; pf; hip add/abd x 10-12 *ue support barbell 4 widths forward, backward amb and side *Seated on bench: STS on water step x7-10.  VC ans min assist for proper execution.  Pt able to gain immediate standing balance ~50% of time *Seated on lift: cycling 2 x 20 rev; hip add/abd 2 x 20 *Forward marching x 2 widths; backward with long steps x 2 widths *ue support wall: hip extension and relaxed squats x 10 *Stair climbing instruction exiting pool  Pt requires the buoyancy and hydrostatic pressure of water for support, and to offload joints by unweighting joint load by at least 50 % in navel deep water and by at least 75-80% in chest to neck deep water.  Viscosity of the water is needed for resistance of strengthening. Water current perturbations provides challenge to standing balance requiring increased core activation.     Exercises  - Standing Knee Flexion Stretch on Step 3 reps - 30 hold  - Prone Quadriceps Stretch with Strap  -R side only 3 reps - 30 hold  - RTB Terminal Knee Extension with Resistance  -2 sets - 10 reps        PATIENT EDUCATION:  Education details: MOI, diagnosis, prognosis, anatomy, exercise progression, DOMS expectations, muscle firing,  envelope of function, HEP, POC  Person educated: Patient Education method: Explanation, Demonstration, and  Handouts Education comprehension: verbalized understanding, further education needed       HOME EXERCISE PROGRAM: Access Code: [email or web address] URL: [email or web address] Date: 10/13/2022 Prepared by: Zebedee Iba   ASSESSMENT:   CLINICAL IMPRESSION: Pt demonstrates safety and indep in setting with therapist instructing from deck.  She is directed through gentle le ROM, strengthening and functional movements to assess toleration to setting and pain level. She does have intermittent slight discomfort throughout right knee and left hip and knee but low level. Exiting pool pt able to complete with reciprocal pattern limited by knee flex right and hip flex left.  She requires 2 hand assist on rails. Of Note: left hip with hard end feel with abd ~ (visually) 30d. Pt is a good candidate for aquatic intervention and will benefit from the properties of water to enhance and progress towards meet goals   Initial assessment Patient is a 77 y.o. female who was seen today for physical therapy evaluation and treatment for c/c of bilat knee pain L>R. Pt's s/s appear consistent with overuse type pain of the L due to R TKA that did not achieve functional levels of strength or knee ROM. Pt's pain is moderately sensitive and irritable with movement. Pt likely to benefit more from aquatic environment to start due to lack of functional mobility and capacity on land. Plan to continue with bilat knee strength and R ROM at future sessions. Pt would benefit from continued skilled therapy in order to reach goals and maximize functional bilat LE strength for prevention of further functional decline and prolonging future surgery.      OBJECTIVE IMPAIRMENTS Abnormal gait, decreased activity tolerance, decreased balance, decreased knowledge of use of DME, hypomobility, muscle spasm, decreased mobility, difficulty walking, decreased ROM, decreased  strength, postural dysfunction, and pain.    ACTIVITY LIMITATIONS  cleaning, community activity, meal prep, and yard work.    PERSONAL FACTORS Age, Fitness, Time since onset of injury/illness/exacerbation, and 3+ comorbidities:  are also affecting patient's functional outcome.     REHAB POTENTIAL: Fair given right knee history and time since manipulation   CLINICAL DECISION MAKING: Evolving/moderate complexity   EVALUATION COMPLEXITY: Moderate     GOALS:  SHORT TERM GOALS:Target date:11/24/2022    Patient will be independent in self management strategies to improve quality of life and functional outcomes.  Goal status: INITIAL   2. Pt will score at least 19 pt increase on FOTO to demonstrate functional improvement in MCII and pt perceived function.    Goal status: INITIAL   3. Pt will be able to demonstrate STS with equal WB and UE support in order to demonstrate functional improvement in LE function for self-care and house hold duties.   Goal status: INITIAL           LONG TERM GOALS:01/05/2023    1.Pt  will become independent with final HEP in order to demonstrate synthesis of PT education.   Goal status: INITIAL    2.  Pt will score >/= 53 on FOTO to demonstrate improvement in perceived bilat knee function. .   Goal status: INITIAL   3.  Patient will be able walk up the steps with reciprocating gait to improve ability to go up stairs at home safely and effectively.  Goal status: INITIAL       PLAN: PT FREQUENCY: 1-2/week   PT DURATION: 12 weeks ( DC by 10wks likely)   PLANNED INTERVENTIONS: Therapeutic exercises, Therapeutic activity, Neuromuscular re-education, Balance training, Gait training, Patient/Family education, Joint mobilization, Stair training, Visual/preceptual remediation/compensation, Dry Needling, Electrical stimulation, Spinal mobilization, Cryotherapy, Moist heat, Traction, Ultrasound, and Manual therapy   PLAN FOR NEXT SESSION: aquatic for knee ROM and strength bilat; hip strength, set up  aquaticHEP   Corrie Dandy (Frankie) Lyman Balingit MPT 11/12/2022, 10:04 AM

## 2022-11-16 ENCOUNTER — Ambulatory Visit (HOSPITAL_BASED_OUTPATIENT_CLINIC_OR_DEPARTMENT_OTHER): Payer: Medicare HMO | Admitting: Physical Therapy

## 2022-11-16 ENCOUNTER — Encounter (HOSPITAL_BASED_OUTPATIENT_CLINIC_OR_DEPARTMENT_OTHER): Payer: Self-pay | Admitting: Physical Therapy

## 2022-11-16 ENCOUNTER — Ambulatory Visit
Admission: RE | Admit: 2022-11-16 | Discharge: 2022-11-16 | Disposition: A | Discharge status: Home / Self Care | Payer: Medicare HMO | Source: Ambulatory Visit | Attending: Family Medicine | Admitting: Family Medicine

## 2022-11-16 DIAGNOSIS — M6281 Muscle weakness (generalized): Secondary | ICD-10-CM

## 2022-11-16 DIAGNOSIS — M25562 Pain in left knee: Secondary | ICD-10-CM | POA: Diagnosis not present

## 2022-11-16 DIAGNOSIS — Z1231 Encounter for screening mammogram for malignant neoplasm of breast: Secondary | ICD-10-CM

## 2022-11-16 DIAGNOSIS — R262 Difficulty in walking, not elsewhere classified: Secondary | ICD-10-CM

## 2022-11-16 DIAGNOSIS — M25561 Pain in right knee: Secondary | ICD-10-CM

## 2022-11-16 NOTE — Therapy (Signed)
OUTPATIENT PHYSICAL THERAPY LOWER EXTREMITY TREATMENT   Patient Name: Aimee Lawrence MRN: [id number] DOB:May 29, 1946, 77 y.o., female Today's Date: 11/16/2022  END OF SESSION:  PT End of Session - 11/16/22 0906     Visit Number 3    Number of Visits 13    Date for PT Re-Evaluation 01/11/23    Authorization Type [employer] M[employer]    PT Start Time 0904    PT Stop Time 0945    PT Time Calculation (min) 41 min    Behavior During Therapy [facility] for tasks assessed/performed             Past Medical History:  Diagnosis Date   Allergy    takes Doxycycline daily and uses Flonase daily   Arthritis    Asthma    Albuterol inhaler as needed.   Dyspnea    rarely and sitting   GERD (gastroesophageal reflux disease)    not on any meds   History of bronchitis    History of shingles    Hx of colonic polyps    benign   Joint pain    Joint swelling    Osteopenia    takes Vit D3 and Calcium daily   Primary localized osteoarthritis of right knee    Past Surgical History:  Procedure Laterality Date   APPENDECTOMY     CHOLECYSTECTOMY  2007   lap choli   COLONOSCOPY  10/22/2015   per Dr. Leone Payor, no polyps, repeat in 5 yrs    KNEE CLOSED REDUCTION Right 09/14/2016   Procedure: CLOSED MANIPULATION KNEE;  Surgeon: Marcene Corning, MD;  Location: [facility] OR;  Service: Orthopedics;  Laterality: Right;   NASAL POLYP SURGERY  2007   SINUS ENDO W/FUSION  06/27/2012   Procedure: ENDOSCOPIC SINUS SURGERY WITH FUSION NAVIGATION;  Surgeon: Drema Halon, MD;  Location: [facility];  Service: ENT;  Laterality: N/A;  FESS FUSION PROTOCOL   TOTAL KNEE ARTHROPLASTY Right 06/15/2016   Procedure: RIGHT TOTAL KNEE ARTHROPLASTY;  Surgeon: Marcene Corning, MD;  Location: [facility] OR;  Service: Orthopedics;  Laterality: Right;   Patient Active Problem List   Diagnosis Date Noted   Aortic atherosclerosis (HCC) 10/29/2022   Abnormal finding on imaging 08/29/2017   Right hip pain 08/15/2017   S/P  TKR (total knee replacement), right 08/11/2017   Osteoarthritis of hip 08/11/2017   Primary localized osteoarthritis of right knee 06/15/2016   Primary osteoarthritis of right knee 06/15/2016   Nasal polyps 11/20/2012   Essential hypertension 05/28/2010   DEPRESSION 02/28/2007   Asthma 02/28/2007   Osteopenia 02/28/2007   Personal hx colon polyps 02/28/2007    PCP:  Nelwyn Salisbury, MD     REFERRING PROVIDER:  Nelwyn Salisbury, MD     REFERRING DIAG:  Diagnosis  M25.561,M25.562,G89.29 (ICD-10-CM) - Bilateral chronic knee pain    THERAPY DIAG:  Pain in joint of right knee  Pain in joint of left knee  Difficulty walking  Muscle weakness (generalized)  Rationale for Evaluation and Treatment: Rehabilitation  ONSET DATE: 2018  SUBJECTIVE:   SUBJECTIVE STATEMENT: Pt reports she has a appt with Dr. Magnus Ivan on 5/22.  She reports she felt good during 1st aquatic session, but was sore afterwards.    She reports she has not done the quad stretch in HEP yet, as she wasn't sure of surface to complete it on.     From Eval:  Pt states she is here bc of her knee pain. Her R knee has  been painful since her TKA in 2018. The L needs surgery at this point too but she wants to prolong this. Pt states she thought she was supposed to see a pain specialist. However, she is willing to try physical therapy. Requires UE for stairs. She has pain all the time but any ADL is painful. She states she is now having new hip pain that she thinks it might be due to how she is walking. She was evaluated for her hips about 5 years ago and was suggested to get a THA at the time as well. Pt denies NT. Pt states that the R knee will buckle and occasionally go weak.  PERTINENT HISTORY: osteopenia, history of bronchitis and asthma, 08/12/20 right knee ablation, multiple knee injections, history of tka R 12/17 with manipulation on 3/18, hard of hearing,   PAIN:  Are you having pain? Yes: NPRS scale: 3/10 Pain  location: R anterior knee; L and ant posterior knee; Lt hip  Pain description: pressure, sharp Aggravating factors: standing, stairs, bending,  Relieving factors: rest   PRECAUTIONS: Fall   WEIGHT BEARING RESTRICTIONS No   FALLS:  Has patient fallen in last 6 months? No   LIVING ENVIRONMENT: Lives with: lives alone   Lives in: House Stairs: Yes: Internal: 10 steps; on left going up and External: 4 steps; she doesn't have to go up the stairs in the house Has following equipment at home: Single point cane uses occasionally, also has quad cane, grabber and sock donner   OCCUPATION: not currently working;   Hobbiesenjoys working in the garden    PLOF: Independent   PATIENT GOALS : prolong/prevent L knee surgery; be able to bend over and pick up things; perform ADLs efficiently      OBJECTIVE:    DIAGNOSTIC FINDINGS: .  FINDINGS: Mild tricompartmental degenerative changes. No fractures or dislocations. No joint effusion. No other abnormalities.   IMPRESSION: Tricompartmental degenerative changes as above.       COGNITION:           Overall cognitive status: Within functional limits for tasks assessed                          SENSATION: WFL     POSTURE:  Fwd flexed posture, increased kyphosis, flexed knee standing, toe out   PALPATION: TTP of bilat knee joint line; hypertonicity and limited scar mobility of R knee              MMT Right 4/3 Left 4/3  Hip flexion    Hip extension    Hip abduction 4/5 4/5  Hip adduction    Hip internal rotation    Hip external rotation    Knee flexion 17.1 33.6  Knee extension 38.3 45.2  Ankle dorsiflexion    Ankle plantarflexion    Ankle inversion    Ankle eversion     (Blank rows = not tested)  AROM Right 4/3 Left 4/3  Hip flexion    Hip extension    Hip abduction    Hip adduction    Hip internal rotation 25 25  Hip external rotation 30 30  Knee flexion 75 117  Knee extension -7 1  Ankle dorsiflexion 0 0   Ankle plantarflexion    Ankle inversion    Ankle eversion     (Blank rows = not tested)    FUNCTIONAL TESTS:  STS: requires UE, R knee goes into extension and WB is primarily  on the L Step up/down: lack of eccentric control with descent, toe out, dynamic valgus   GAIT: Distance walked: 50 feet Assistive device utilized: SPC Level of assistance: Mod I Comments: toe out, decreased step length, [place] bilat, shortened step length       TODAY'S TREATMENT: Pt seen for aquatic therapy today.  Treatment took place in water 3.5-4.75 ft in depth at the [facility] pool. Temp of water was 91.  Pt entered/exited the pool via stair using step-to pattern with bilateral hand rail.  *UE support barbell: walking forward x 3 laps, backward x 2 laps;  side stepping x 3 laps; forward with cues for heel strike x 1 lap; high knee marching 1 lap *UE support wall: heel/toe raises x 10;   hip add/abd x 10 each LE; hip ext to toe touch x 10 each;  hamstring curls, alternating LEs; relaxed squats x 10 with vertical trunk * return to walking forward with barbell, cues for increased heel strike * at stairs:  R/L/R quad stretch (Rt foot on 1st step, Lt foot on 2nd step during stretch) x 15s each  * Rt forward lunge and straightening out knee (foot on 1st step) for Rt knee ROM x 8 reps   Pt requires the buoyancy and hydrostatic pressure of water for support, and to offload joints by unweighting joint load by at least 50 % in navel deep water and by at least 75-80% in chest to neck deep water.  Viscosity of the water is needed for resistance of strengthening. Water current perturbations provides challenge to standing balance requiring increased core activation.   PATIENT EDUCATION:  Education details: aquatic exercise modifications/ progressions  Person educated: Patient Education method: Solicitor,  Education comprehension: verbalized understanding, further education needed        HOME EXERCISE PROGRAM: Access Code: [email or web address] URL: [email or web address] Date: 10/13/2022 Prepared by: Zebedee Iba   ASSESSMENT:   CLINICAL IMPRESSION: Pt given cues for more neutral foot position in standing (vs turned out) and for increased Rt heel strike.  Difficulty with Rt knee flexion stretch due to limited ROM.  She reported some increase in Lt hip discomfort with Lt hip abdct at wall, but otherwise tolerated all other exercises well. Goals are ongoing.  . Pt is a good candidate for aquatic intervention and will benefit from the properties of water to enhance and progress towards meet goals.     OBJECTIVE IMPAIRMENTS Abnormal gait, decreased activity tolerance, decreased balance, decreased knowledge of use of DME, hypomobility, muscle spasm, decreased mobility, difficulty walking, decreased ROM, decreased strength, postural dysfunction, and pain.    ACTIVITY LIMITATIONS cleaning, community activity, meal prep, and yard work.    PERSONAL FACTORS Age, Fitness, Time since onset of injury/illness/exacerbation, and 3+ comorbidities:  are also affecting patient's functional outcome.     REHAB POTENTIAL: Fair given right knee history and time since manipulation   CLINICAL DECISION MAKING: Evolving/moderate complexity   EVALUATION COMPLEXITY: Moderate     GOALS:  SHORT TERM GOALS:Target date:11/24/2022    Patient will be independent in self management strategies to improve quality of life and functional outcomes.  Goal status: INITIAL   2. Pt will score at least 19 pt increase on FOTO to demonstrate functional improvement in MCII and pt perceived function.    Goal status: INITIAL   3. Pt will be able to demonstrate STS with equal WB and UE support in order to demonstrate functional improvement in LE function for self-care and house hold duties.  Goal status: INITIAL           LONG TERM GOALS:01/05/2023    1.Pt  will become independent with final HEP in  order to demonstrate synthesis of PT education.   Goal status: INITIAL    2.  Pt will score >/= 53 on FOTO to demonstrate improvement in perceived bilat knee function. .   Goal status: INITIAL   3.  Patient will be able walk up the steps with reciprocating gait to improve ability to go up stairs at home safely and effectively.  Goal status: INITIAL       PLAN: PT FREQUENCY: 1-2/week   PT DURATION: 12 weeks ( DC by 10wks likely)   PLANNED INTERVENTIONS: Therapeutic exercises, Therapeutic activity, Neuromuscular re-education, Balance training, Gait training, Patient/Family education, Joint mobilization, Stair training, Visual/preceptual remediation/compensation, Dry Needling, Electrical stimulation, Spinal mobilization, Cryotherapy, Moist heat, Traction, Ultrasound, and Manual therapy   PLAN FOR NEXT SESSION: aquatic for knee ROM and strength bilat; hip strength, set up aquaticHEP  Mayer Camel, PTA 11/16/22 11:05 AM [facility] [address] [place], [place], [address] Phone: [phone]   Fax:  [phone]

## 2022-11-17 ENCOUNTER — Telehealth: Payer: Self-pay | Admitting: Family Medicine

## 2022-11-17 NOTE — Telephone Encounter (Signed)
Called patient to schedule Medicare Annual Wellness Visit (AWV). Left message for patient to call back and schedule Medicare Annual Wellness Visit (AWV).  Last date of AWV: 02/17/21  Please schedule an appointment at any time with [facility] BEverly or hannah kim.  If any questions, please contact me at [phone].  Thank you ,  Rudell Cobb AWV direct phone # [phone]

## 2022-11-19 ENCOUNTER — Ambulatory Visit (HOSPITAL_BASED_OUTPATIENT_CLINIC_OR_DEPARTMENT_OTHER): Payer: Medicare HMO | Admitting: Physical Therapy

## 2022-11-19 ENCOUNTER — Encounter (HOSPITAL_BASED_OUTPATIENT_CLINIC_OR_DEPARTMENT_OTHER): Payer: Self-pay | Admitting: Physical Therapy

## 2022-11-19 DIAGNOSIS — R262 Difficulty in walking, not elsewhere classified: Secondary | ICD-10-CM

## 2022-11-19 DIAGNOSIS — M6281 Muscle weakness (generalized): Secondary | ICD-10-CM

## 2022-11-19 DIAGNOSIS — M25562 Pain in left knee: Secondary | ICD-10-CM | POA: Diagnosis not present

## 2022-11-19 DIAGNOSIS — J3081 Allergic rhinitis due to animal (cat) (dog) hair and dander: Secondary | ICD-10-CM | POA: Diagnosis not present

## 2022-11-19 DIAGNOSIS — J301 Allergic rhinitis due to pollen: Secondary | ICD-10-CM | POA: Diagnosis not present

## 2022-11-19 DIAGNOSIS — J3089 Other allergic rhinitis: Secondary | ICD-10-CM | POA: Diagnosis not present

## 2022-11-19 DIAGNOSIS — M25561 Pain in right knee: Secondary | ICD-10-CM | POA: Diagnosis not present

## 2022-11-19 NOTE — Therapy (Signed)
OUTPATIENT PHYSICAL THERAPY LOWER EXTREMITY TREATMENT   Patient Name: Aimee Lawrence MRN: [id number] DOB:1946/02/11, 77 y.o., female Today's Date: 11/19/2022  END OF SESSION:  PT End of Session - 11/19/22 0904     Visit Number 4    Number of Visits 13    Date for PT Re-Evaluation 01/11/23    Authorization Type [employer] MCR    PT Start Time 0903    PT Stop Time 0945    PT Time Calculation (min) 42 min    Behavior During Therapy [facility] for tasks assessed/performed             Past Medical History:  Diagnosis Date   Allergy    takes Doxycycline daily and uses Flonase daily   Arthritis    Asthma    Albuterol inhaler as needed.   Dyspnea    rarely and sitting   GERD (gastroesophageal reflux disease)    not on any meds   History of bronchitis    History of shingles    Hx of colonic polyps    benign   Joint pain    Joint swelling    Osteopenia    takes Vit D3 and Calcium daily   Primary localized osteoarthritis of right knee    Past Surgical History:  Procedure Laterality Date   APPENDECTOMY     CHOLECYSTECTOMY  2007   lap choli   COLONOSCOPY  10/22/2015   per Dr. Leone Payor, no polyps, repeat in 5 yrs    KNEE CLOSED REDUCTION Right 09/14/2016   Procedure: CLOSED MANIPULATION KNEE;  Surgeon: Marcene Corning, MD;  Location: [facility] OR;  Service: Orthopedics;  Laterality: Right;   NASAL POLYP SURGERY  2007   SINUS ENDO W/FUSION  06/27/2012   Procedure: ENDOSCOPIC SINUS SURGERY WITH FUSION NAVIGATION;  Surgeon: Drema Halon, MD;  Location: [facility];  Service: ENT;  Laterality: N/A;  FESS FUSION PROTOCOL   TOTAL KNEE ARTHROPLASTY Right 06/15/2016   Procedure: RIGHT TOTAL KNEE ARTHROPLASTY;  Surgeon: Marcene Corning, MD;  Location: [facility] OR;  Service: Orthopedics;  Laterality: Right;   Patient Active Problem List   Diagnosis Date Noted   Aortic atherosclerosis (HCC) 10/29/2022   Abnormal finding on imaging 08/29/2017   Right hip pain 08/15/2017   S/P  TKR (total knee replacement), right 08/11/2017   Osteoarthritis of hip 08/11/2017   Primary localized osteoarthritis of right knee 06/15/2016   Primary osteoarthritis of right knee 06/15/2016   Nasal polyps 11/20/2012   Essential hypertension 05/28/2010   DEPRESSION 02/28/2007   Asthma 02/28/2007   Osteopenia 02/28/2007   Personal hx colon polyps 02/28/2007    PCP:  Nelwyn Salisbury, MD     REFERRING PROVIDER:  Nelwyn Salisbury, MD     REFERRING DIAG:  Diagnosis  M25.561,M25.562,G89.29 (ICD-10-CM) - Bilateral chronic knee pain    THERAPY DIAG:  Pain in joint of right knee  Pain in joint of left knee  Difficulty walking  Muscle weakness (generalized)  Rationale for Evaluation and Treatment: Rehabilitation  ONSET DATE: 2018  SUBJECTIVE:   SUBJECTIVE STATEMENT: Pt reports no increase or decrease in pain after last session. She was expecting pain to increase so she was pleasantly surprised.  Also was able to work in garden without increase in pain sx   From Eval:  Pt states she is here bc of her knee pain. Her R knee has been painful since her TKA in 2018. The L needs surgery at this point too but she  wants to prolong this. Pt states she thought she was supposed to see a pain specialist. However, she is willing to try physical therapy. Requires UE for stairs. She has pain all the time but any ADL is painful. She states she is now having new hip pain that she thinks it might be due to how she is walking. She was evaluated for her hips about 5 years ago and was suggested to get a THA at the time as well. Pt denies NT. Pt states that the R knee will buckle and occasionally go weak.  PERTINENT HISTORY: osteopenia, history of bronchitis and asthma, 08/12/20 right knee ablation, multiple knee injections, history of tka R 12/17 with manipulation on 3/18, hard of hearing,   PAIN:  Are you having pain? Yes: NPRS scale: 3/10 Pain location: R anterior knee; L and ant posterior knee;  Lt hip  Pain description: pressure, sharp Aggravating factors: standing, stairs, bending,  Relieving factors: rest   PRECAUTIONS: Fall   WEIGHT BEARING RESTRICTIONS No   FALLS:  Has patient fallen in last 6 months? No   LIVING ENVIRONMENT: Lives with: lives alone   Lives in: House Stairs: Yes: Internal: 10 steps; on left going up and External: 4 steps; she doesn't have to go up the stairs in the house Has following equipment at home: Single point cane uses occasionally, also has quad cane, grabber and sock donner   OCCUPATION: not currently working;   Hobbiesenjoys working in the garden    PLOF: Independent   PATIENT GOALS : prolong/prevent L knee surgery; be able to bend over and pick up things; perform ADLs efficiently      OBJECTIVE:    DIAGNOSTIC FINDINGS: .  FINDINGS: Mild tricompartmental degenerative changes. No fractures or dislocations. No joint effusion. No other abnormalities.   IMPRESSION: Tricompartmental degenerative changes as above.       COGNITION:           Overall cognitive status: Within functional limits for tasks assessed                          SENSATION: WFL     POSTURE:  Fwd flexed posture, increased kyphosis, flexed knee standing, toe out   PALPATION: TTP of bilat knee joint line; hypertonicity and limited scar mobility of R knee              MMT Right 4/3 Left 4/3  Hip flexion    Hip extension    Hip abduction 4/5 4/5  Hip adduction    Hip internal rotation    Hip external rotation    Knee flexion 17.1 33.6  Knee extension 38.3 45.2  Ankle dorsiflexion    Ankle plantarflexion    Ankle inversion    Ankle eversion     (Blank rows = not tested)  AROM Right 4/3 Left 4/3  Hip flexion    Hip extension    Hip abduction    Hip adduction    Hip internal rotation 25 25  Hip external rotation 30 30  Knee flexion 75 117  Knee extension -7 1  Ankle dorsiflexion 0 0  Ankle plantarflexion    Ankle inversion    Ankle  eversion     (Blank rows = not tested)    FUNCTIONAL TESTS:  STS: requires UE, R knee goes into extension and WB is primarily on the L Step up/down: lack of eccentric control with descent, toe out, dynamic valgus  GAIT: Distance walked: 50 feet Assistive device utilized: SPC Level of assistance: Mod I Comments: toe out, decreased step length, [place] bilat, shortened step length       TODAY'S TREATMENT: Pt seen for aquatic therapy today.  Treatment took place in water 3.5-4.75 ft in depth at the [facility] pool. Temp of water was 91.  Pt entered/exited the pool via stair using step-to pattern with bilateral hand rail.  *without ue support: walking forward x 3 laps, backward x 2 laps;  side stepping x 3 laps; forward with cues for heel strike x 1 lap; high knee marching 1 lap *UE support wall: 1/2 diamonds x 15:toe raises x 10;    *Ue support on barbell completed in 3.6 ft: DF; high knee marching ; hip add/abd; hamstring curls, alternating Le's for strengthening and balance; 3 way tap balance;  *hip flex and quad stretching in standing.  Attempted hip IR/ER stretching in sitting (unsuccessful, unable to gain positioning) * return to walking forward with barbell, cues for increased heel strike *side lunge.  VC and demonstration x 4 widths *seated on lift: cycling; add/abd 2 sets of 20reps.  Pt requires the buoyancy and hydrostatic pressure of water for support, and to offload joints by unweighting joint load by at least 50 % in navel deep water and by at least 75-80% in chest to neck deep water.  Viscosity of the water is needed for resistance of strengthening. Water current perturbations provides challenge to standing balance requiring increased core activation.   PATIENT EDUCATION:  Education details: aquatic exercise modifications/ progressions  Person educated: Patient Education method: Solicitor,  Education comprehension: verbalized understanding,  further education needed       HOME EXERCISE PROGRAM: Access Code: [email or web address] URL: [email or web address] Date: 10/13/2022 Prepared by: Zebedee Iba   ASSESSMENT:   CLINICAL IMPRESSION: Pt tolerates progressed program increasing balance and core strengthening tasks by decreasing ue support with exercises.  No LOB or reports of pain.  Hips continue to be very tight. Unable to gain position in sitting for piriformis stretch. Good participation/engagement.  Goals ongoing.       OBJECTIVE IMPAIRMENTS Abnormal gait, decreased activity tolerance, decreased balance, decreased knowledge of use of DME, hypomobility, muscle spasm, decreased mobility, difficulty walking, decreased ROM, decreased strength, postural dysfunction, and pain.    ACTIVITY LIMITATIONS cleaning, community activity, meal prep, and yard work.    PERSONAL FACTORS Age, Fitness, Time since onset of injury/illness/exacerbation, and 3+ comorbidities:  are also affecting patient's functional outcome.     REHAB POTENTIAL: Fair given right knee history and time since manipulation   CLINICAL DECISION MAKING: Evolving/moderate complexity   EVALUATION COMPLEXITY: Moderate     GOALS:  SHORT TERM GOALS:Target date:11/24/2022    Patient will be independent in self management strategies to improve quality of life and functional outcomes.  Goal status: INITIAL   2. Pt will score at least 19 pt increase on FOTO to demonstrate functional improvement in MCII and pt perceived function.    Goal status: INITIAL   3. Pt will be able to demonstrate STS with equal WB and UE support in order to demonstrate functional improvement in LE function for self-care and house hold duties.   Goal status: INITIAL           LONG TERM GOALS:01/05/2023    1.Pt  will become independent with final HEP in order to demonstrate synthesis of PT education.   Goal status: INITIAL    2.  Pt will score >/=  53 on FOTO to demonstrate  improvement in perceived bilat knee function. .   Goal status: INITIAL   3.  Patient will be able walk up the steps with reciprocating gait to improve ability to go up stairs at home safely and effectively.  Goal status: INITIAL       PLAN: PT FREQUENCY: 1-2/week   PT DURATION: 12 weeks ( DC by 10wks likely)   PLANNED INTERVENTIONS: Therapeutic exercises, Therapeutic activity, Neuromuscular re-education, Balance training, Gait training, Patient/Family education, Joint mobilization, Stair training, Visual/preceptual remediation/compensation, Dry Needling, Electrical stimulation, Spinal mobilization, Cryotherapy, Moist heat, Traction, Ultrasound, and Manual therapy   PLAN FOR NEXT SESSION: aquatic for knee ROM and strength bilat; hip strength, set up aquaticHEP  Mayer Camel, PTA 11/19/22 9:05 AM [facility] [address] [place], [place], [address] Phone: [phone]   Fax:  [phone]

## 2022-11-23 ENCOUNTER — Ambulatory Visit (HOSPITAL_BASED_OUTPATIENT_CLINIC_OR_DEPARTMENT_OTHER): Payer: Medicare HMO | Admitting: Physical Therapy

## 2022-11-23 ENCOUNTER — Encounter (HOSPITAL_BASED_OUTPATIENT_CLINIC_OR_DEPARTMENT_OTHER): Payer: Self-pay | Admitting: Physical Therapy

## 2022-11-23 DIAGNOSIS — M25561 Pain in right knee: Secondary | ICD-10-CM

## 2022-11-23 DIAGNOSIS — M25562 Pain in left knee: Secondary | ICD-10-CM | POA: Diagnosis not present

## 2022-11-23 DIAGNOSIS — M6281 Muscle weakness (generalized): Secondary | ICD-10-CM

## 2022-11-23 DIAGNOSIS — R262 Difficulty in walking, not elsewhere classified: Secondary | ICD-10-CM | POA: Diagnosis not present

## 2022-11-23 NOTE — Therapy (Signed)
OUTPATIENT PHYSICAL THERAPY LOWER EXTREMITY TREATMENT   Patient Name: Aimee Lawrence MRN: [id number] DOB:[birth year], 77 y.o., female Today's Date: 11/23/2022  END OF SESSION:  PT End of Session - 11/23/22 0901     Visit Number 5    Number of Visits 13    Date for PT Re-Evaluation 01/11/23    Authorization Type [employer] M[employer]    PT Start Time 0901    PT Stop Time 0940    PT Time Calculation (min) 39 min    Activity Tolerance Patient tolerated treatment well    Behavior During Therapy WFL for tasks assessed/performed             Past Medical History:  Diagnosis Date   Allergy    takes Doxycycline daily and uses Flonase daily   Arthritis    Asthma    Albuterol inhaler as needed.   Dyspnea    rarely and sitting   GERD (gastroesophageal reflux disease)    not on any meds   History of bronchitis    History of shingles    Hx of colonic polyps    benign   Joint pain    Joint swelling    Osteopenia    takes Vit D3 and Calcium daily   Primary localized osteoarthritis of right knee    Past Surgical History:  Procedure Laterality Date   APPENDECTOMY     CHOLECYSTECTOMY  2007   lap choli   COLONOSCOPY  10/22/2015   per Dr. Leone Payor, no polyps, repeat in 5 yrs    KNEE CLOSED REDUCTION Right 09/14/2016   Procedure: CLOSED MANIPULATION KNEE;  Surgeon: Marcene Corning, MD;  Location: [facility] OR;  Service: Orthopedics;  Laterality: Right;   NASAL POLYP SURGERY  2007   SINUS ENDO W/FUSION  06/27/2012   Procedure: ENDOSCOPIC SINUS SURGERY WITH FUSION NAVIGATION;  Surgeon: Drema Halon, MD;  Location: [facility];  Service: ENT;  Laterality: N/A;  FESS FUSION PROTOCOL   TOTAL KNEE ARTHROPLASTY Right 06/15/2016   Procedure: RIGHT TOTAL KNEE ARTHROPLASTY;  Surgeon: Marcene Corning, MD;  Location: [facility] OR;  Service: Orthopedics;  Laterality: Right;   Patient Active Problem List   Diagnosis Date Noted   Aortic atherosclerosis (HCC) 10/29/2022   Abnormal finding on  imaging 08/29/2017   Right hip pain 08/15/2017   S/P TKR (total knee replacement), right 08/11/2017   Osteoarthritis of hip 08/11/2017   Primary localized osteoarthritis of right knee 06/15/2016   Primary osteoarthritis of right knee 06/15/2016   Nasal polyps 11/20/2012   Essential hypertension 05/28/2010   DEPRESSION 02/28/2007   Asthma 02/28/2007   Osteopenia 02/28/2007   Personal hx colon polyps 02/28/2007    PCP:  Nelwyn Salisbury, MD     REFERRING PROVIDER:  Nelwyn Salisbury, MD     REFERRING DIAG:  Diagnosis  M25.561,M25.562,G89.29 (ICD-10-CM) - Bilateral chronic knee pain    THERAPY DIAG:  Pain in joint of right knee  Pain in joint of left knee  Difficulty walking  Muscle weakness (generalized)  Rationale for Evaluation and Treatment: Rehabilitation  ONSET DATE: 2018  SUBJECTIVE:   SUBJECTIVE STATEMENT: Pt reports that she was able to stretch her Rt quad with foot on the first step behind her; held it a couple minutes.    From Eval:  Pt states she is here bc of her knee pain. Her R knee has been painful since her TKA in 2018. The L needs surgery at this point too but she wants  to prolong this. Pt states she thought she was supposed to see a pain specialist. However, she is willing to try physical therapy. Requires UE for stairs. She has pain all the time but any ADL is painful. She states she is now having new hip pain that she thinks it might be due to how she is walking. She was evaluated for her hips about 5 years ago and was suggested to get a THA at the time as well. Pt denies NT. Pt states that the R knee will buckle and occasionally go weak.  PERTINENT HISTORY: osteopenia, history of bronchitis and asthma, 08/12/20 right knee ablation, multiple knee injections, history of tka R 12/17 with manipulation on 3/18, hard of hearing,   PAIN:  Are you having pain? Yes: NPRS scale: 2/10 Pain location: R anterior knee; L and ant posterior knee; Lt hip  Pain  description: pressure, sharp Aggravating factors: standing, stairs, bending,  Relieving factors: rest   PRECAUTIONS: Fall   WEIGHT BEARING RESTRICTIONS No   FALLS:  Has patient fallen in last 6 months? No   LIVING ENVIRONMENT: Lives with: lives alone   Lives in: House Stairs: Yes: Internal: 10 steps; on left going up and External: 4 steps; she doesn't have to go up the stairs in the house Has following equipment at home: Single point cane uses occasionally, also has quad cane, grabber and sock donner   OCCUPATION: not currently working;   Hobbiesenjoys working in the garden    PLOF: Independent   PATIENT GOALS : prolong/prevent L knee surgery; be able to bend over and pick up things; perform ADLs efficiently      OBJECTIVE:    DIAGNOSTIC FINDINGS: .  FINDINGS: Mild tricompartmental degenerative changes. No fractures or dislocations. No joint effusion. No other abnormalities.   IMPRESSION: Tricompartmental degenerative changes as above.       COGNITION:           Overall cognitive status: Within functional limits for tasks assessed                          SENSATION: WFL     POSTURE:  Fwd flexed posture, increased kyphosis, flexed knee standing, toe out   PALPATION: TTP of bilat knee joint line; hypertonicity and limited scar mobility of R knee              MMT Right 4/3 Left 4/3  Hip flexion    Hip extension    Hip abduction 4/5 4/5  Hip adduction    Hip internal rotation    Hip external rotation    Knee flexion 17.1 33.6  Knee extension 38.3 45.2  Ankle dorsiflexion    Ankle plantarflexion    Ankle inversion    Ankle eversion     (Blank rows = not tested)  AROM Right 4/3 Left 4/3  Hip flexion    Hip extension    Hip abduction    Hip adduction    Hip internal rotation 25 25  Hip external rotation 30 30  Knee flexion 75 117  Knee extension -7 1  Ankle dorsiflexion 0 0  Ankle plantarflexion    Ankle inversion    Ankle eversion      (Blank rows = not tested)    FUNCTIONAL TESTS:  STS: requires UE, R knee goes into extension and WB is primarily on the L Step up/down: lack of eccentric control with descent, toe out, dynamic valgus   GAIT:  Distance walked: 50 feet Assistive device utilized: SPC Level of assistance: Mod I Comments: toe out, decreased step length, [place] bilat, shortened step length       TODAY'S TREATMENT: Pt seen for aquatic therapy today.  Treatment took place in water 3.5-4.75 ft in depth at the [facility] pool. Temp of water was 91.  Pt entered/exited the pool via stair using step-to pattern with bilateral hand rail.  *without UE support: walking forward/backward x 5 laps,  side stepping x 3 laps;  high knee marching forward/ backwards x 2 laps *UE support wall: 1/2 diamonds x 15: heel/toe raises x 10;   straight LE hip circles x 5 CW/CCW each LE * return to high knee marching, then forward walking with focus on heel strike and TKE in stance * R/L/R quad stretch with foot on step behind her x 30s each * Seated on bench in water with feet on blue step:  STS without support/with barbell -cues for forward arm reach, hip hinge and not using the back of legs to push up. * Rt step ups on blue step (5") with RUE on wall x 10, repeated with Lt knee ups x 5 * bilat calf stretch with heels off of step x20s * return to walking forward / backward   Pt requires the buoyancy and hydrostatic pressure of water for support, and to offload joints by unweighting joint load by at least 50 % in navel deep water and by at least 75-80% in chest to neck deep water.  Viscosity of the water is needed for resistance of strengthening. Water current perturbations provides challenge to standing balance requiring increased core activation.   PATIENT EDUCATION:  Education details: aquatic exercise modifications/ progressions  Person educated: Patient Education method: Solicitor,  Education  comprehension: verbalized understanding, further education needed       HOME EXERCISE PROGRAM: Access Code: [email or web address] URL: [email or web address] Date: 10/13/2022 Prepared by: Zebedee Iba   ASSESSMENT:   CLINICAL IMPRESSION: Continued focus on gaining ROM in R knee and bilat hips, as well as normalizing gait.  Pt now tolerating quad stretch well, and is advised to continue this stretch on land. She reports increased discomfort during this stretch, but discomfort subsides with change in exercise.  Goals ongoing.  FOTO next session.        OBJECTIVE IMPAIRMENTS Abnormal gait, decreased activity tolerance, decreased balance, decreased knowledge of use of DME, hypomobility, muscle spasm, decreased mobility, difficulty walking, decreased ROM, decreased strength, postural dysfunction, and pain.    ACTIVITY LIMITATIONS cleaning, community activity, meal prep, and yard work.    PERSONAL FACTORS Age, Fitness, Time since onset of injury/illness/exacerbation, and 3+ comorbidities:  are also affecting patient's functional outcome.     REHAB POTENTIAL: Fair given right knee history and time since manipulation   CLINICAL DECISION MAKING: Evolving/moderate complexity   EVALUATION COMPLEXITY: Moderate     GOALS:  SHORT TERM GOALS:Target date:11/24/2022    Patient will be independent in self management strategies to improve quality of life and functional outcomes.  Goal status: INITIAL   2. Pt will score at least 19 pt increase on FOTO to demonstrate functional improvement in MCII and pt perceived function.    Goal status: INITIAL   3. Pt will be able to demonstrate STS with equal WB and UE support in order to demonstrate functional improvement in LE function for self-care and house hold duties.   Goal status: INITIAL           LONG  TERM GOALS:01/05/2023    1.Pt  will become independent with final HEP in order to demonstrate synthesis of PT education.   Goal status:  INITIAL    2.  Pt will score >/= 53 on FOTO to demonstrate improvement in perceived bilat knee function. .   Goal status: INITIAL   3.  Patient will be able walk up the steps with reciprocating gait to improve ability to go up stairs at home safely and effectively.  Goal status: INITIAL       PLAN: PT FREQUENCY: 1-2/week   PT DURATION: 12 weeks ( DC by 10wks likely)   PLANNED INTERVENTIONS: Therapeutic exercises, Therapeutic activity, Neuromuscular re-education, Balance training, Gait training, Patient/Family education, Joint mobilization, Stair training, Visual/preceptual remediation/compensation, Dry Needling, Electrical stimulation, Spinal mobilization, Cryotherapy, Moist heat, Traction, Ultrasound, and Manual therapy   PLAN FOR NEXT SESSION: aquatic for knee ROM and strength bilat; hip strength, set up aquaticHEP  Mayer Camel, PTA 11/23/22 9:44 AM [facility] [address] [place], [place], [address] Phone: [phone]   Fax:  [phone]

## 2022-11-26 ENCOUNTER — Encounter (HOSPITAL_BASED_OUTPATIENT_CLINIC_OR_DEPARTMENT_OTHER): Payer: Self-pay | Admitting: Physical Therapy

## 2022-11-26 ENCOUNTER — Ambulatory Visit (HOSPITAL_BASED_OUTPATIENT_CLINIC_OR_DEPARTMENT_OTHER): Payer: Medicare HMO | Admitting: Physical Therapy

## 2022-11-26 DIAGNOSIS — M25562 Pain in left knee: Secondary | ICD-10-CM | POA: Diagnosis not present

## 2022-11-26 DIAGNOSIS — M6281 Muscle weakness (generalized): Secondary | ICD-10-CM

## 2022-11-26 DIAGNOSIS — J3089 Other allergic rhinitis: Secondary | ICD-10-CM | POA: Diagnosis not present

## 2022-11-26 DIAGNOSIS — R262 Difficulty in walking, not elsewhere classified: Secondary | ICD-10-CM

## 2022-11-26 DIAGNOSIS — M25561 Pain in right knee: Secondary | ICD-10-CM | POA: Diagnosis not present

## 2022-11-26 DIAGNOSIS — J3081 Allergic rhinitis due to animal (cat) (dog) hair and dander: Secondary | ICD-10-CM | POA: Diagnosis not present

## 2022-11-26 DIAGNOSIS — J301 Allergic rhinitis due to pollen: Secondary | ICD-10-CM | POA: Diagnosis not present

## 2022-11-26 NOTE — Therapy (Signed)
OUTPATIENT PHYSICAL THERAPY LOWER EXTREMITY TREATMENT   Patient Name: Aimee Lawrence MRN: [id number] DOB:11-28-1945, 77 y.o., female Today's Date: 11/26/2022  END OF SESSION:  PT End of Session - 11/26/22 0910     Visit Number 6    Number of Visits 13    Date for PT Re-Evaluation 01/11/23    Authorization Type [employer] MCR    PT Start Time 0903    PT Stop Time 0945    PT Time Calculation (min) 42 min    Activity Tolerance Patient tolerated treatment well    Behavior During Therapy WFL for tasks assessed/performed             Past Medical History:  Diagnosis Date   Allergy    takes Doxycycline daily and uses Flonase daily   Arthritis    Asthma    Albuterol inhaler as needed.   Dyspnea    rarely and sitting   GERD (gastroesophageal reflux disease)    not on any meds   History of bronchitis    History of shingles    Hx of colonic polyps    benign   Joint pain    Joint swelling    Osteopenia    takes Vit D3 and Calcium daily   Primary localized osteoarthritis of right knee    Past Surgical History:  Procedure Laterality Date   APPENDECTOMY     CHOLECYSTECTOMY  2007   lap choli   COLONOSCOPY  10/22/2015   per Dr. Leone Payor, no polyps, repeat in 5 yrs    KNEE CLOSED REDUCTION Right 09/14/2016   Procedure: CLOSED MANIPULATION KNEE;  Surgeon: Marcene Corning, MD;  Location: [facility] OR;  Service: Orthopedics;  Laterality: Right;   NASAL POLYP SURGERY  2007   SINUS ENDO W/FUSION  06/27/2012   Procedure: ENDOSCOPIC SINUS SURGERY WITH FUSION NAVIGATION;  Surgeon: Drema Halon, MD;  Location: [facility];  Service: ENT;  Laterality: N/A;  FESS FUSION PROTOCOL   TOTAL KNEE ARTHROPLASTY Right 06/15/2016   Procedure: RIGHT TOTAL KNEE ARTHROPLASTY;  Surgeon: Marcene Corning, MD;  Location: [facility] OR;  Service: Orthopedics;  Laterality: Right;   Patient Active Problem List   Diagnosis Date Noted   Aortic atherosclerosis (HCC) 10/29/2022   Abnormal finding on  imaging 08/29/2017   Right hip pain 08/15/2017   S/P TKR (total knee replacement), right 08/11/2017   Osteoarthritis of hip 08/11/2017   Primary localized osteoarthritis of right knee 06/15/2016   Primary osteoarthritis of right knee 06/15/2016   Nasal polyps 11/20/2012   Essential hypertension 05/28/2010   DEPRESSION 02/28/2007   Asthma 02/28/2007   Osteopenia 02/28/2007   Personal hx colon polyps 02/28/2007    PCP:  Nelwyn Salisbury, MD     REFERRING PROVIDER:  Nelwyn Salisbury, MD     REFERRING DIAG:  Diagnosis  M25.561,M25.562,G89.29 (ICD-10-CM) - Bilateral chronic knee pain    THERAPY DIAG:  Pain in joint of right knee  Pain in joint of left knee  Difficulty walking  Muscle weakness (generalized)  Rationale for Evaluation and Treatment: Rehabilitation  ONSET DATE: 2018  SUBJECTIVE:   SUBJECTIVE STATEMENT: Pt reports compliance with HEP 2 x daily for past few days.   From Eval:  Pt states she is here bc of her knee pain. Her R knee has been painful since her TKA in 2018. The L needs surgery at this point too but she wants to prolong this. Pt states she thought she was supposed to see a  pain specialist. However, she is willing to try physical therapy. Requires UE for stairs. She has pain all the time but any ADL is painful. She states she is now having new hip pain that she thinks it might be due to how she is walking. She was evaluated for her hips about 5 years ago and was suggested to get a THA at the time as well. Pt denies NT. Pt states that the R knee will buckle and occasionally go weak.  PERTINENT HISTORY: osteopenia, history of bronchitis and asthma, 08/12/20 right knee ablation, multiple knee injections, history of tka R 12/17 with manipulation on 3/18, hard of hearing,   PAIN:  Are you having pain? Yes: NPRS scale: 2/10 Pain location: R anterior knee; L and ant posterior knee; Lt hip  Pain description: pressure, sharp Aggravating factors: standing,  stairs, bending,  Relieving factors: rest   PRECAUTIONS: Fall   WEIGHT BEARING RESTRICTIONS No   FALLS:  Has patient fallen in last 6 months? No   LIVING ENVIRONMENT: Lives with: lives alone   Lives in: House Stairs: Yes: Internal: 10 steps; on left going up and External: 4 steps; she doesn't have to go up the stairs in the house Has following equipment at home: Single point cane uses occasionally, also has quad cane, grabber and sock donner   OCCUPATION: not currently working;   Hobbiesenjoys working in the garden    PLOF: Independent   PATIENT GOALS : prolong/prevent L knee surgery; be able to bend over and pick up things; perform ADLs efficiently      OBJECTIVE:    DIAGNOSTIC FINDINGS: .  FINDINGS: Mild tricompartmental degenerative changes. No fractures or dislocations. No joint effusion. No other abnormalities.   IMPRESSION: Tricompartmental degenerative changes as above.       COGNITION:           Overall cognitive status: Within functional limits for tasks assessed                          SENSATION: WFL     POSTURE:  Fwd flexed posture, increased kyphosis, flexed knee standing, toe out   PALPATION: TTP of bilat knee joint line; hypertonicity and limited scar mobility of R knee              MMT Right 4/3 Left 4/3  Hip flexion    Hip extension    Hip abduction 4/5 4/5  Hip adduction    Hip internal rotation    Hip external rotation    Knee flexion 17.1 33.6  Knee extension 38.3 45.2  Ankle dorsiflexion    Ankle plantarflexion    Ankle inversion    Ankle eversion     (Blank rows = not tested)  AROM Right 4/3 Left 4/3  Hip flexion    Hip extension    Hip abduction    Hip adduction    Hip internal rotation 25 25  Hip external rotation 30 30  Knee flexion 75 117  Knee extension -7 1  Ankle dorsiflexion 0 0  Ankle plantarflexion    Ankle inversion    Ankle eversion     (Blank rows = not tested)    FUNCTIONAL TESTS:  STS:  requires UE, R knee goes into extension and WB is primarily on the L Step up/down: lack of eccentric control with descent, toe out, dynamic valgus   GAIT: Distance walked: 50 feet Assistive device utilized: SPC Level of assistance: Mod I  Comments: toe out, decreased step length, [place] bilat, shortened step length       TODAY'S TREATMENT: Pt seen for aquatic therapy today.  Treatment took place in water 3.5-4.75 ft in depth at the [facility] pool. Temp of water was 91.  Pt entered/exited the pool via stair using step-to pattern with bilateral hand rail.  *without UE support: walking forward/backward x 5 laps,  side stepping x 3 laps;  high knee marching forward/ backwards x 2 laps * R/L/R quad stretch with foot on step behind her x 30s each * bilat calf stretch with heels off of step x20s * Rt and Lt step ups on blue step (6") with RUE hand rail x 10 *UE support wall: 1/2 diamonds x 15: heel/toe raises x 10;   hip extension x 12; hip abd x 12; relaxed squat 2 x 10 *seated on lift ankle cuffs: resisted LAQ 2x10 R/L   - cycling; hip add/abd  x 20 * walking forward / backward between exercises for recovery  Pt requires the buoyancy and hydrostatic pressure of water for support, and to offload joints by unweighting joint load by at least 50 % in navel deep water and by at least 75-80% in chest to neck deep water.  Viscosity of the water is needed for resistance of strengthening. Water current perturbations provides challenge to standing balance requiring increased core activation.   PATIENT EDUCATION:  Education details: aquatic exercise modifications/ progressions  Person educated: Patient Education method: Solicitor,  Education comprehension: verbalized understanding, further education needed       HOME EXERCISE PROGRAM: Access Code: [email or web address] URL: [email or web address] Date: 10/13/2022 Prepared by: Zebedee Iba   ASSESSMENT:    CLINICAL IMPRESSION: Pt has appt with Dr Magnus Ivan on 5/22 with anticipation of getting date for left TKR.  She tolerates increased resistance with program today well. Added buoyance cuffs to work LE eccentrically as well as concentrically. Extended quat stretch then added gastroc stretching submerged. Pt reports left le tighter than right although range visibly is wfl.  Foto complete, scored 36 up form 30%Goals ongoing  Continued focus on gaining ROM in R knee and bilat hips, as well as normalizing gait.  Pt now tolerating quad stretch well, and is advised to continue this stretch on land. She reports increased discomfort during this stretch, but discomfort subsides with change in exercise.  Goals ongoing.  FOTO next session.        OBJECTIVE IMPAIRMENTS Abnormal gait, decreased activity tolerance, decreased balance, decreased knowledge of use of DME, hypomobility, muscle spasm, decreased mobility, difficulty walking, decreased ROM, decreased strength, postural dysfunction, and pain.    ACTIVITY LIMITATIONS cleaning, community activity, meal prep, and yard work.    PERSONAL FACTORS Age, Fitness, Time since onset of injury/illness/exacerbation, and 3+ comorbidities:  are also affecting patient's functional outcome.     REHAB POTENTIAL: Fair given right knee history and time since manipulation   CLINICAL DECISION MAKING: Evolving/moderate complexity   EVALUATION COMPLEXITY: Moderate     GOALS:  SHORT TERM GOALS:Target date:11/24/2022    Patient will be independent in self management strategies to improve quality of life and functional outcomes.  Goal status: INITIAL   2. Pt will score at least 19 pt increase on FOTO to demonstrate functional improvement in MCII and pt perceived function.    Goal status: in progress   3. Pt will be able to demonstrate STS with equal WB and UE support in order to demonstrate functional improvement in LE function for  self-care and house hold duties.    Goal status: INITIAL           LONG TERM GOALS:01/05/2023    1.Pt  will become independent with final HEP in order to demonstrate synthesis of PT education.   Goal status: INITIAL    2.  Pt will score >/= 53 on FOTO to demonstrate improvement in perceived bilat knee function. .   Goal status: in progress   3.  Patient will be able walk up the steps with reciprocating gait to improve ability to go up stairs at home safely and effectively.  Goal status: INITIAL       PLAN: PT FREQUENCY: 1-2/week   PT DURATION: 12 weeks ( DC by 10wks likely)   PLANNED INTERVENTIONS: Therapeutic exercises, Therapeutic activity, Neuromuscular re-education, Balance training, Gait training, Patient/Family education, Joint mobilization, Stair training, Visual/preceptual remediation/compensation, Dry Needling, Electrical stimulation, Spinal mobilization, Cryotherapy, Moist heat, Traction, Ultrasound, and Manual therapy   PLAN FOR NEXT SESSION: aquatic for knee ROM and strength bilat; hip strength, set up aquaticHEP  Corrie Dandy Tomma Lightning) Ector Laurel MPT 11/26/22 10:26 AM [facility] [address] [place], [place], [address] Phone: [phone]   Fax:  [phone]

## 2022-12-01 ENCOUNTER — Ambulatory Visit: Payer: Medicare HMO | Admitting: Orthopaedic Surgery

## 2022-12-01 ENCOUNTER — Encounter: Payer: Self-pay | Admitting: Orthopaedic Surgery

## 2022-12-01 VITALS — Ht 60.5 in | Wt 158.0 lb

## 2022-12-01 DIAGNOSIS — M25552 Pain in left hip: Secondary | ICD-10-CM

## 2022-12-01 DIAGNOSIS — M1612 Unilateral primary osteoarthritis, left hip: Secondary | ICD-10-CM

## 2022-12-01 NOTE — Progress Notes (Signed)
The patient is a 77 year old female sent from my partner Dr. Steward Drone to evaluate and treat significant arthritis of her left hip.  It is actually quite severe.  She does ambulate with a cane.  Her hip pain is daily and it has been going on for many years now.  It is detrimentally affecting her mobility, her quality of life and actives daily living.  She says both her knees hurt both knees show patellofemoral arthritis but still well-maintained medial lateral compartments.  She is not a diabetic and not on blood thinning medication.  She is a patient of Dr. Clent Ridges who is her PCP.  I was able to review all of her notes as well as past medical history and medications within epic.  On exam her right hip moves smoothly and fluidly.  The left hip is incredibly stiff with internal and external rotation as well as pain in the groin with rotation.  An AP pelvis and lateral of the left hip shows severe end-stage arthritis of the left hip.  There is deformities in the femoral head and acetabulum.  The joint space is severely narrowed and basically bone-on-bone wear.  There is cystic changes and osteophytes around the hip.  The right hip has only mild arthritis.  I did share with her x-rays and we discussed in length hip replacement surgery.  We talked about the risks and benefits of the surgery and what to expect from an intraoperative and postoperative course.  I did show her hip replacement model and gave her handout about hip replacement surgery.  All questions and concerns were addressed and answered.  She would like [place] work getting on getting her scheduled for a left hip replacement.

## 2022-12-03 ENCOUNTER — Other Ambulatory Visit: Payer: Self-pay | Admitting: Family Medicine

## 2022-12-09 DIAGNOSIS — J3089 Other allergic rhinitis: Secondary | ICD-10-CM | POA: Diagnosis not present

## 2022-12-09 DIAGNOSIS — J3081 Allergic rhinitis due to animal (cat) (dog) hair and dander: Secondary | ICD-10-CM | POA: Diagnosis not present

## 2022-12-09 DIAGNOSIS — J301 Allergic rhinitis due to pollen: Secondary | ICD-10-CM | POA: Diagnosis not present

## 2022-12-14 ENCOUNTER — Ambulatory Visit (HOSPITAL_BASED_OUTPATIENT_CLINIC_OR_DEPARTMENT_OTHER): Payer: Medicare HMO | Attending: Family Medicine | Admitting: Physical Therapy

## 2022-12-14 ENCOUNTER — Encounter (HOSPITAL_BASED_OUTPATIENT_CLINIC_OR_DEPARTMENT_OTHER): Payer: Self-pay | Admitting: Physical Therapy

## 2022-12-14 DIAGNOSIS — M6281 Muscle weakness (generalized): Secondary | ICD-10-CM

## 2022-12-14 DIAGNOSIS — R262 Difficulty in walking, not elsewhere classified: Secondary | ICD-10-CM

## 2022-12-14 DIAGNOSIS — M25561 Pain in right knee: Secondary | ICD-10-CM | POA: Diagnosis not present

## 2022-12-14 DIAGNOSIS — M25562 Pain in left knee: Secondary | ICD-10-CM | POA: Diagnosis not present

## 2022-12-14 NOTE — Therapy (Signed)
OUTPATIENT PHYSICAL THERAPY LOWER EXTREMITY TREATMENT  PHYSICAL THERAPY DISCHARGE SUMMARY  Visits from Start of Care: 7  Plan: Patient agrees to discharge.  Patient goals were met. Patient is being discharged due to meeting the stated rehab goals.         Patient Name: Aimee Lawrence MRN: [id number] DOB:30-Jan-1946, 77 y.o., female Today's Date: 12/14/2022  END OF SESSION:  PT End of Session - 12/14/22 1446     Visit Number 7    Number of Visits 13    Date for PT Re-Evaluation 01/11/23    Authorization Type [employer] MCR    PT Start Time 1446    PT Stop Time 1530    PT Time Calculation (min) 44 min    Activity Tolerance Patient tolerated treatment well    Behavior During Therapy WFL for tasks assessed/performed             Past Medical History:  Diagnosis Date   Allergy    takes Doxycycline daily and uses Flonase daily   Arthritis    Asthma    Albuterol inhaler as needed.   Dyspnea    rarely and sitting   GERD (gastroesophageal reflux disease)    not on any meds   History of bronchitis    History of shingles    Hx of colonic polyps    benign   Joint pain    Joint swelling    Osteopenia    takes Vit D3 and Calcium daily   Primary localized osteoarthritis of right knee    Past Surgical History:  Procedure Laterality Date   APPENDECTOMY     CHOLECYSTECTOMY  2007   lap choli   COLONOSCOPY  10/22/2015   per Dr. Leone Payor, no polyps, repeat in 5 yrs    KNEE CLOSED REDUCTION Right 09/14/2016   Procedure: CLOSED MANIPULATION KNEE;  Surgeon: Marcene Corning, MD;  Location: [facility] OR;  Service: Orthopedics;  Laterality: Right;   NASAL POLYP SURGERY  2007   SINUS ENDO W/FUSION  06/27/2012   Procedure: ENDOSCOPIC SINUS SURGERY WITH FUSION NAVIGATION;  Surgeon: Drema Halon, MD;  Location: [facility];  Service: ENT;  Laterality: N/A;  FESS FUSION PROTOCOL   TOTAL KNEE ARTHROPLASTY Right 06/15/2016   Procedure: RIGHT TOTAL KNEE ARTHROPLASTY;   Surgeon: Marcene Corning, MD;  Location: [facility] OR;  Service: Orthopedics;  Laterality: Right;   Patient Active Problem List   Diagnosis Date Noted   Aortic atherosclerosis (HCC) 10/29/2022   Abnormal finding on imaging 08/29/2017   Right hip pain 08/15/2017   S/P TKR (total knee replacement), right 08/11/2017   Unilateral primary osteoarthritis, left hip 08/11/2017   Primary localized osteoarthritis of right knee 06/15/2016   Primary osteoarthritis of right knee 06/15/2016   Nasal polyps 11/20/2012   Essential hypertension 05/28/2010   DEPRESSION 02/28/2007   Asthma 02/28/2007   Osteopenia 02/28/2007   Personal hx colon polyps 02/28/2007    PCP:  Nelwyn Salisbury, MD     REFERRING PROVIDER:  Nelwyn Salisbury, MD     REFERRING DIAG:  Diagnosis  M25.561,M25.562,G89.29 (ICD-10-CM) - Bilateral chronic knee pain    THERAPY DIAG:  Pain in joint of right knee  Pain in joint of left knee  Difficulty walking  Muscle weakness (generalized)  Rationale for Evaluation and Treatment: Rehabilitation  ONSET DATE: 2018  SUBJECTIVE:   SUBJECTIVE STATEMENT: Pt states the knees have gotten slightly better but the R hip is hurting. Pt states she will have the  L hip planned for July. Pt would like to close out this case in order to wait for the L hip rehab. Pt states that housework and stairs feel similar. She feels that her endurance/stamina has improved. The knee ROM does not appear to have changed. Pt notes that the R hip hurts more than the L hip. "Hardly notice it."    From Eval:  Pt states she is here bc of her knee pain. Her R knee has been painful since her TKA in 2018. The L needs surgery at this point too but she wants to prolong this. Pt states she thought she was supposed to see a pain specialist. However, she is willing to try physical therapy. Requires UE for stairs. She has pain all the time but any ADL is painful. She states she is now having new hip pain that she thinks it  might be due to how she is walking. She was evaluated for her hips about 5 years ago and was suggested to get a THA at the time as well. Pt denies NT. Pt states that the R knee will buckle and occasionally go weak.  PERTINENT HISTORY: osteopenia, history of bronchitis and asthma, 08/12/20 right knee ablation, multiple knee injections, history of tka R 12/17 with manipulation on 3/18, hard of hearing,   PAIN:  Are you having pain? Yes: NPRS scale: 2/10 Pain location: R anterior knee; L and ant posterior knee; R hip  Pain description: pressure, sharp Aggravating factors: standing, stairs, bending,  Relieving factors: rest   PRECAUTIONS: Fall   WEIGHT BEARING RESTRICTIONS No   FALLS:  Has patient fallen in last 6 months? No   LIVING ENVIRONMENT: Lives with: lives alone   Lives in: House Stairs: Yes: Internal: 10 steps; on left going up and External: 4 steps; she doesn't have to go up the stairs in the house Has following equipment at home: Single point cane uses occasionally, also has quad cane, grabber and sock donner   OCCUPATION: not currently working;   Hobbiesenjoys working in the garden    PLOF: Independent   PATIENT GOALS : prolong/prevent L knee surgery; be able to bend over and pick up things; perform ADLs efficiently      OBJECTIVE:    DIAGNOSTIC FINDINGS: .  FINDINGS: Mild tricompartmental degenerative changes. No fractures or dislocations. No joint effusion. No other abnormalities.   IMPRESSION: Tricompartmental degenerative changes as above.       COGNITION:           Overall cognitive status: Within functional limits for tasks assessed                          SENSATION: [facility]     FOTO 6/4 53              MMT Right 4/3 R  6/4 Left 4/3 L 6/4  Hip flexion      Hip extension      Hip abduction 4/5 32.4 4/5 33.5  Hip adduction      Hip internal rotation      Hip external rotation      Knee flexion 17.1 28.4 33.6 37.2  Knee extension 38.3 45.3  45.2 44.6  Ankle dorsiflexion      Ankle plantarflexion      Ankle inversion      Ankle eversion       (Blank rows = not tested)  AROM Right 4/3 R 6/4 Left 4/3 L  6/4  Hip flexion  90  90  Hip extension      Hip abduction      Hip adduction      Hip internal rotation 25 25 25 20   Hip external rotation 30 30 30 30   Knee flexion 75 85 117 118  Knee extension -7 -2 1 0  Ankle dorsiflexion 0  0   Ankle plantarflexion      Ankle inversion      Ankle eversion       (Blank rows = not tested)    FUNCTIONAL TESTS:  STS: requires UE, equal WB, toe out on R, hip dominant strategy Step up/down: lack of eccentric control with descent, toe out, reciprocal ascend and descend with UE support   GAIT: Distance walked: 50 feet Assistive device utilized: SPC Level of assistance: Mod I Comments: toe out, decreased step length, [place] bilat, shortened step length       TODAY'S TREATMENT:  6/4   Exercises - Standing Knee Flexion Stretch on Step  - 1 x daily - 7 x weekly - 1 sets - 3 reps - 30 hold - Prone Quadriceps Stretch with Strap  - 1 x daily - 7 x weekly - 1 sets - 3 reps - 30 hold - Standing Terminal Knee Extension with Resistance  - 1 x daily - 7 x weekly - 2 sets - 10 reps - Sit to Stand with Resistance Around Legs  - 1 x daily - 7 x weekly - 3 sets - 5 reps - Standing Hip Flexor Stretch  - 2 x daily - 7 x weekly - 1 sets - 2 reps - 30 hold  Previous:  Pt seen for aquatic therapy today.  Treatment took place in water 3.5-4.75 ft in depth at the [facility] pool. Temp of water was 91.  Pt entered/exited the pool via stair using step-to pattern with bilateral hand rail.  *without UE support: walking forward/backward x 5 laps,  side stepping x 3 laps;  high knee marching forward/ backwards x 2 laps * R/L/R quad stretch with foot on step behind her x 30s each * bilat calf stretch with heels off of step x20s * Rt and Lt step ups on blue step (6") with RUE hand  rail x 10 *UE support wall: 1/2 diamonds x 15: heel/toe raises x 10;   hip extension x 12; hip abd x 12; relaxed squat 2 x 10 *seated on lift ankle cuffs: resisted LAQ 2x10 R/L   - cycling; hip add/abd  x 20 * walking forward / backward between exercises for recovery  Pt requires the buoyancy and hydrostatic pressure of water for support, and to offload joints by unweighting joint load by at least 50 % in navel deep water and by at least 75-80% in chest to neck deep water.  Viscosity of the water is needed for resistance of strengthening. Water current perturbations provides challenge to standing balance requiring increased core activation.   PATIENT EDUCATION:  Education details: aquatic exercise modifications/ progressions  Person educated: Patient Education method: Solicitor,  Education comprehension: verbalized understanding, further education needed       HOME EXERCISE PROGRAM: Access Code: [email or web address] URL: [email or web address] Date: 10/13/2022 Prepared by: Zebedee Iba   ASSESSMENT:   CLINICAL IMPRESSION: Pt has made improvement in knee ROM and strength compared to previous assessment as demonstrated by FOTO and objective measures. Pt has met FOTO goals as well as PT goals at this time. Pt is still limited  with mobility, ROM, and functional strength but request to place current PT on hold as she will return for L hip rehab following new L THA that will be scheduled in July. D/C this episode of care. HEP full reviewed with new exercises trialed and progression/regression given.   OBJECTIVE IMPAIRMENTS Abnormal gait, decreased activity tolerance, decreased balance, decreased knowledge of use of DME, hypomobility, muscle spasm, decreased mobility, difficulty walking, decreased ROM, decreased strength, postural dysfunction, and pain.    ACTIVITY LIMITATIONS cleaning, community activity, meal prep, and yard work.    PERSONAL FACTORS Age, Fitness, Time since  onset of injury/illness/exacerbation, and 3+ comorbidities:  are also affecting patient's functional outcome.     REHAB POTENTIAL: Fair given right knee history and time since manipulation   CLINICAL DECISION MAKING: Evolving/moderate complexity   EVALUATION COMPLEXITY: Moderate     GOALS:  SHORT TERM GOALS:Target date:11/24/2022    Patient will be independent in self management strategies to improve quality of life and functional outcomes.  Goal status: MET   2. Pt will score at least 19 pt increase on FOTO to demonstrate functional improvement in MCII and pt perceived function.    Goal status: MET   3. Pt will be able to demonstrate STS with equal WB and UE support in order to demonstrate functional improvement in LE function for self-care and house hold duties.   Goal status: MET           LONG TERM GOALS:01/05/2023    1.Pt  will become independent with final HEP in order to demonstrate synthesis of PT education.   Goal status: met    2.  Pt will score >/= 53 on FOTO to demonstrate improvement in perceived bilat knee function. .   Goal status: met 3.  Patient will be able walk up the steps with reciprocating gait to improve ability to go up stairs at home safely and effectively.  Goal status: met       PLAN: PT FREQUENCY: 1-2/week   PT DURATION: 12 weeks ( DC by 10wks likely)   PLANNED INTERVENTIONS: Therapeutic exercises, Therapeutic activity, Neuromuscular re-education, Balance training, Gait training, Patient/Family education, Joint mobilization, Stair training, Visual/preceptual remediation/compensation, Dry Needling, Electrical stimulation, Spinal mobilization, Cryotherapy, Moist heat, Traction, Ultrasound, and Manual therapy     Zebedee Iba PT, DPT 12/14/22 3:55 PM

## 2022-12-21 ENCOUNTER — Ambulatory Visit (HOSPITAL_BASED_OUTPATIENT_CLINIC_OR_DEPARTMENT_OTHER): Payer: Medicare HMO | Admitting: Physical Therapy

## 2022-12-22 ENCOUNTER — Other Ambulatory Visit: Payer: Self-pay | Admitting: Family Medicine

## 2022-12-23 ENCOUNTER — Encounter (HOSPITAL_BASED_OUTPATIENT_CLINIC_OR_DEPARTMENT_OTHER): Payer: Medicare HMO | Admitting: Physical Therapy

## 2022-12-24 NOTE — Telephone Encounter (Signed)
Pt LOV was on 11/08/22 Last refill was done on 10/13/22 Please advise

## 2022-12-28 ENCOUNTER — Ambulatory Visit (HOSPITAL_BASED_OUTPATIENT_CLINIC_OR_DEPARTMENT_OTHER): Payer: Medicare HMO | Admitting: Physical Therapy

## 2022-12-30 ENCOUNTER — Encounter (HOSPITAL_BASED_OUTPATIENT_CLINIC_OR_DEPARTMENT_OTHER): Payer: Medicare HMO | Admitting: Physical Therapy

## 2022-12-31 ENCOUNTER — Telehealth: Payer: Self-pay

## 2022-12-31 NOTE — Telephone Encounter (Signed)
Pt called and states that she is scheduled to have  total hip surgery with CB on 02/08/2023. She states that her pain has become increasingly more sever and states that she is taking tramadol to help with this. He pt is asking if she can have something else for pain. She uses the [facility] at [address] and her cb # [phone]

## 2023-01-04 ENCOUNTER — Ambulatory Visit (HOSPITAL_BASED_OUTPATIENT_CLINIC_OR_DEPARTMENT_OTHER): Payer: Medicare HMO | Admitting: Physical Therapy

## 2023-01-06 ENCOUNTER — Ambulatory Visit (HOSPITAL_BASED_OUTPATIENT_CLINIC_OR_DEPARTMENT_OTHER): Payer: Medicare HMO | Admitting: Physical Therapy

## 2023-01-06 MED ORDER — OMEPRAZOLE 40 MG PO CPDR: DELAYED_RELEASE_CAPSULE | ORAL | 1 refills | Status: DC

## 2023-01-07 NOTE — Therapy (Signed)
No visit. Session created in error. No charges. Note created in order to close chart.

## 2023-01-19 ENCOUNTER — Telehealth: Payer: Self-pay | Admitting: Radiology

## 2023-01-19 ENCOUNTER — Other Ambulatory Visit: Payer: Self-pay | Admitting: Orthopaedic Surgery

## 2023-01-19 NOTE — Telephone Encounter (Signed)
LMOM for patient of the below message from [facility]  

## 2023-01-20 ENCOUNTER — Other Ambulatory Visit: Payer: Self-pay

## 2023-01-21 ENCOUNTER — Encounter (HOSPITAL_BASED_OUTPATIENT_CLINIC_OR_DEPARTMENT_OTHER): Payer: Self-pay | Admitting: Orthopaedic Surgery

## 2023-01-24 DIAGNOSIS — J3081 Allergic rhinitis due to animal (cat) (dog) hair and dander: Secondary | ICD-10-CM | POA: Diagnosis not present

## 2023-01-24 DIAGNOSIS — J3089 Other allergic rhinitis: Secondary | ICD-10-CM | POA: Diagnosis not present

## 2023-01-24 DIAGNOSIS — J301 Allergic rhinitis due to pollen: Secondary | ICD-10-CM | POA: Diagnosis not present

## 2023-01-27 ENCOUNTER — Ambulatory Visit (HOSPITAL_BASED_OUTPATIENT_CLINIC_OR_DEPARTMENT_OTHER): Payer: Medicare HMO | Admitting: Orthopaedic Surgery

## 2023-01-27 ENCOUNTER — Other Ambulatory Visit (HOSPITAL_BASED_OUTPATIENT_CLINIC_OR_DEPARTMENT_OTHER): Payer: Self-pay

## 2023-01-27 DIAGNOSIS — M25551 Pain in right hip: Secondary | ICD-10-CM | POA: Diagnosis not present

## 2023-01-27 MED ORDER — ACETAMINOPHEN 500 MG PO TABS
500.0000 mg | ORAL_TABLET | Freq: Three times a day (TID) | ORAL | 0 refills | Status: AC
  Filled 2023-01-27: qty 30, 10d supply, fill #0

## 2023-01-27 NOTE — Progress Notes (Signed)
Chief Complaint: Left hip pain, left knee pain     History of Present Illness:   01/27/2023: Presents today for follow-up and discussion.  She states that at this point she is not having any left hip pain but predominantly her right groin is becoming much more symptomatic.  She is hoping to get an injection on the side although she has not discussed this with Dr. Magnus Ivan who is performing her surgery on the left hip at the end of this month.  Aimee Lawrence is a 77 y.o. female presents with persistent left hip pain and left knee pain for the last several years.  She does have a history of a right total knee arthroplasty done in 2018 by Dr. Jerl Santos.  She has been having a hard time rehabbing this right side as result of pain on the left side.  She does enjoy being active and has been doing physical therapy and plans to start aquatic therapy.  She does take tramadol for pain.  She is not able to take NSAIDs.  Her left hip has been bothering her particularly with motion in terms of being able to garden.  She is quite friendly.    Surgical History:   Right total knee arthroplasty by Dr. Jerl Santos in 2018  PMH/PSH/Family History/Social History/Meds/Allergies:    Past Medical History:  Diagnosis Date   Allergy    takes Doxycycline daily and uses Flonase daily   Arthritis    Asthma    Albuterol inhaler as needed.   Dyspnea    rarely and sitting   GERD (gastroesophageal reflux disease)    not on any meds   History of bronchitis    History of shingles    Hx of colonic polyps    benign   Joint pain    Joint swelling    Osteopenia    takes Vit D3 and Calcium daily   Primary localized osteoarthritis of right knee    Past Surgical History:  Procedure Laterality Date   APPENDECTOMY     CHOLECYSTECTOMY  2007   lap choli   COLONOSCOPY  10/22/2015   per Dr. Leone Payor, no polyps, repeat in 5 yrs    KNEE CLOSED REDUCTION Right 09/14/2016   Procedure:  CLOSED MANIPULATION KNEE;  Surgeon: Marcene Corning, MD;  Location: [facility] OR;  Service: Orthopedics;  Laterality: Right;   NASAL POLYP SURGERY  2007   SINUS ENDO W/FUSION  06/27/2012   Procedure: ENDOSCOPIC SINUS SURGERY WITH FUSION NAVIGATION;  Surgeon: Drema Halon, MD;  Location: [facility];  Service: ENT;  Laterality: N/A;  FESS FUSION PROTOCOL   TOTAL KNEE ARTHROPLASTY Right 06/15/2016   Procedure: RIGHT TOTAL KNEE ARTHROPLASTY;  Surgeon: Marcene Corning, MD;  Location: [facility] OR;  Service: Orthopedics;  Laterality: Right;   Social History   Socioeconomic History   Marital status: Single    Spouse name: Not on file   Number of children: 0   Years of education: Not on file   Highest education level: Master's degree (e.g., MA, MS, MEng, MEd, MSW, MBA)  Occupational History   Occupation: Retired Runner, broadcasting/film/video  Tobacco Use   Smoking status: Never   Smokeless tobacco: Never  Vaping Use   Vaping status: Never Used  Substance and Sexual Activity   Alcohol use: No  Alcohol/week: 0.0 standard drinks of alcohol    Comment: no etoh    Drug use: No   Sexual activity: Not on file  Other Topics Concern   Not on file  Social History Narrative   Retired   Single   Regular exercise- no         Social Determinants of Health   Financial Resource Strain: Low Risk  (10/06/2022)   Overall Financial Resource Strain (CARDIA)    Difficulty of Paying Living Expenses: Not hard at all  Food Insecurity: No Food Insecurity (10/06/2022)   Hunger Vital Sign    Worried About Running Out of Food in the Last Year: Never true    Ran Out of Food in the Last Year: Never true  Transportation Needs: Unknown (10/06/2022)   PRAPARE - Administrator, Civil Service (Medical): No    Lack of Transportation (Non-Medical): Not on file  Physical Activity: Unknown (10/06/2022)   Exercise Vital Sign    Days of Exercise per Week: 0 days    Minutes of Exercise per Session: Not on file  Stress:  No Stress Concern Present (10/06/2022)   [employer] of Occupational Health - Occupational Stress Questionnaire    Feeling of Stress : Not at all  Social Connections: Unknown (10/06/2022)   Social Connection and Isolation Panel [NHANES]    Frequency of Communication with Friends and Family: More than three times a week    Frequency of Social Gatherings with Friends and Family: More than three times a week    Attends Religious Services: Patient declined    Database administrator or Organizations: No    Attends Engineer, structural: Not on file    Marital Status: Patient declined   Family History  Problem Relation Age of Onset   Breast cancer Mother    Pneumonia Mother    AAA (abdominal aortic aneurysm) Father    Alcohol abuse Other    Arthritis Other    Diabetes Other    Heart disease Other    Colon cancer Neg Hx    Stomach cancer Neg Hx    Allergies  Allergen Reactions   Aleve [Naproxen Sodium] Shortness Of Breath   Aspirin Shortness Of Breath    Trouble breathing   Ibuprofen Shortness Of Breath    Short of breath   Ampicillin Rash     Has patient had a PCN reaction causing immediate rash, facial/tongue/throat swelling, SOB or lightheadedness with hypotension:   # # NO # #  Has patient had a PCN reaction that required hospitalization    # # NO # #  Has patient had a PCN reaction occurring within the last 10 years:    # # NO # #  If all of the above answers are "NO", then may proceed with Cephalosporin use.    Erythromycin Nausea And Vomiting   Current Outpatient Medications  Medication Sig Dispense Refill   acetaminophen (TYLENOL) 500 MG tablet Take 1 tablet (500 mg total) by mouth every 8 (eight) hours for 10 days. 30 tablet 0   acetaminophen (TYLENOL) 500 MG tablet Take 500 mg by mouth every 6 (six) hours as needed for moderate pain.     albuterol (VENTOLIN HFA) 108 (90 Base) MCG/ACT inhaler Inhale 1-2 puffs into the lungs every 6 (six) hours as needed for  wheezing or shortness of breath. 8.5 g 0   atorvastatin (LIPITOR) 10 MG tablet Take 1 tablet (10 mg total) by mouth daily. 90  tablet 3   Calcium Carbonate-Vitamin D (CALCIUM 500/D PO) Take 2 tablets by mouth daily. 500 mg-200 mg     Cholecalciferol (VITAMIN D3) 2000 units capsule Take 4,000 Units by mouth daily.      EPINEPHrine 0.3 mg/0.3 mL IJ SOAJ injection      Famotidine (ACID CONTROLLER PO) Take by mouth daily.     FIBER SELECT GUMMIES PO Take 1 tablet by mouth daily.     fluticasone (FLONASE) 50 MCG/ACT nasal spray Place 1 spray into both nostrils daily.      Guaifenesin 1200 MG TB12 Take 1 tablet (1,200 mg total) by mouth 2 (two) times daily at 10 AM and 5 PM. 14 tablet 0   MAGNESIUM CITRATE PO Take 250 mg by mouth daily.     montelukast (SINGULAIR) 10 MG tablet Take 1 tablet by mouth once daily or as directed. 90 tablet 3   omeprazole (PRILOSEC) 40 MG capsule TAKE 1 CAPSULE BY MOUTH TWICE A DAY WITH A MEAL 60 capsule 1   ondansetron (ZOFRAN) 8 MG tablet Take 1 tablet (8 mg total) by mouth every 8 (eight) hours as needed for nausea or vomiting. 60 tablet 1   traMADol (ULTRAM) 50 MG tablet TAKE 2 TABLETS BY MOUTH EVERY 6 HOURS AS NEEDED 180 tablet 2   Turmeric (QC TUMERIC COMPLEX PO) Take by mouth. With Ginger     No current facility-administered medications for this visit.   No results found.  Review of Systems:   A ROS was performed including pertinent positives and negatives as documented in the HPI.  Physical Exam :   Constitutional: NAD and appears stated age Neurological: Alert and oriented Psych: Appropriate affect and cooperative There were no vitals taken for this visit.   Comprehensive Musculoskeletal Exam:    Left hip with essentially 0 degrees of internal/external rotation with 90 degrees of flexion causing pain.  Pain is involving the femoral acetabular joint.  Range of motion about the left knee is from 0 to 130 degrees with medial lateral and patellofemoral joint  line pain.  Significant crepitus with both  Imaging:   Xray (3 views left hip, 4 views left knee): Advanced femoral acetabular arthritis of the left hip, mild osteoarthritis of the left knee   I personally reviewed and interpreted the radiographs.   Assessment:   77 y.o. female with significant left hip advanced arthritis as well as mild left knee osteoarthritis.  At this time I do believe that her left hip is likely causing more pain and stress throughout the left knee as well as the right knee.  I do believe that given the severity of her arthritis that she may benefit from a left hip arthroplasty.  To that effect I will plan to refer her to Dr. Magnus Ivan for discussion of this.  I would also like to provide a left knee ultrasound-guided injection with steroid to hopefully get her some relief in the meantime  Plan :    -I will plan to send a note to Dr. Magnus Ivan as her right hip is actually much more painful at today's visit.  I would like to defer an injection into the hip in case they decide to ultimately do this hip     I personally saw and evaluated the patient, and participated in the management and treatment plan.  Huel Cote, MD Attending Physician, Orthopedic Surgery  This document was dictated using Dragon voice recognition software. A reasonable attempt at proof reading has been made to  minimize errors.

## 2023-01-27 NOTE — Pre-Procedure Instructions (Signed)
Surgical Instructions   Your procedure is scheduled on Tuesday, July 30th. Report to [facility] Main Entrance "A" at 05:30 A.M., then check in with the Admitting office. Any questions or running late day of surgery: call [phone]  Questions prior to your surgery date: call [phone], Monday-Friday, 8am-4pm. If you experience any cold or flu symptoms such as cough, fever, chills, shortness of breath, etc. between now and your scheduled surgery, please notify us at the above number.     Remember:  Do not eat after midnight the night before your surgery  You may drink clear liquids until 04:30 AM the morning of your surgery.   Clear liquids allowed are: Water, Non-Citrus Juices (without pulp), Carbonated Beverages, Clear Tea, Black Coffee Only (NO MILK, CREAM OR POWDERED CREAMER of any kind), and Gatorade.   Patient Instructions  The night before surgery:  No food after midnight. ONLY clear liquids after midnight  The day of surgery (if you do NOT have diabetes):  Drink ONE (1) Pre-Surgery Clear Ensure by 04:30 AM the morning of surgery. Drink in one sitting. Do not sip.  This drink was given to you during your hospital  pre-op appointment visit.  Nothing else to drink after completing the  Pre-Surgery Clear Ensure.          If you have questions, please contact your surgeon's office.     Take these medicines the morning of surgery with A SIP OF WATER  atorvastatin (LIPITOR)  Famotidine  fluticasone (FLONASE)  montelukast (SINGULAIR)  omeprazole (PRILOSEC)     May take these medicines IF NEEDED: acetaminophen (TYLENOL)  albuterol (VENTOLIN HFA)- bring inhaler with you on day of surgery EPINEPHrine  ondansetron (ZOFRAN)  traMADol (ULTRAM)     One week prior to surgery, STOP taking any Aspirin (unless otherwise instructed by your surgeon) Aleve, Naproxen, Ibuprofen, Motrin, Advil, Goody's, BC's, all herbal medications, fish oil, and non-prescription vitamins.                      Do NOT Smoke (Tobacco/Vaping) for 24 hours prior to your procedure.  If you use a CPAP at night, you may bring your mask/headgear for your overnight stay.   You will be asked to remove any contacts, glasses, piercing's, hearing aid's, dentures/partials prior to surgery. Please bring cases for these items if needed.    Patients discharged the day of surgery will not be allowed to drive home, and someone needs to stay with them for 24 hours.  SURGICAL WAITING ROOM VISITATION Patients may have no more than 2 support people in the waiting area - these visitors may rotate.   Pre-op nurse will coordinate an appropriate time for 1 ADULT support person, who may not rotate, to accompany patient in pre-op.  Children under the age of 4 must have an adult with them who is not the patient and must remain in the main waiting area with an adult.  If the patient needs to stay at the hospital during part of their recovery, the visitor guidelines for inpatient rooms apply.  Please refer to the [facility] website for the visitor guidelines for any additional information.   If you received a COVID test during your pre-op visit  it is requested that you wear a mask when out in public, stay away from anyone that may not be feeling well and notify your surgeon if you develop symptoms. If you have been in contact with anyone that has tested positive in the  last 10 days please notify you surgeon.      Pre-operative 5 CHG Bathing Instructions   You can play a key role in reducing the risk of infection after surgery. Your skin needs to be as free of germs as possible. You can reduce the number of germs on your skin by washing with CHG (chlorhexidine gluconate) soap before surgery. CHG is an antiseptic soap that kills germs and continues to kill germs even after washing.   DO NOT use if you have an allergy to chlorhexidine/CHG or antibacterial soaps. If your skin becomes reddened or irritated,  stop using the CHG and notify one of our RNs at [phone].   Please shower with the CHG soap starting 4 days before surgery using the following schedule:     Please keep in mind the following:  DO NOT shave, including legs and underarms, starting the day of your first shower.   You may shave your face at any point before/day of surgery.  Place clean sheets on your bed the day you start using CHG soap. Use a clean washcloth (not used since being washed) for each shower. DO NOT sleep with pets once you start using the CHG.   CHG Shower Instructions:  If you choose to wash your hair and private area, wash first with your normal shampoo/soap.  After you use shampoo/soap, rinse your hair and body thoroughly to remove shampoo/soap residue.  Turn the water OFF and apply about 3 tablespoons (45 ml) of CHG soap to a CLEAN washcloth.  Apply CHG soap ONLY FROM YOUR NECK DOWN TO YOUR TOES (washing for 3-5 minutes)  DO NOT use CHG soap on face, private areas, open wounds, or sores.  Pay special attention to the area where your surgery is being performed.  If you are having back surgery, having someone wash your back for you may be helpful. Wait 2 minutes after CHG soap is applied, then you may rinse off the CHG soap.  Pat dry with a clean towel  Put on clean clothes/pajamas   If you choose to wear lotion, please use ONLY the CHG-compatible lotions on the back of this paper.   Additional instructions for the day of surgery: DO NOT APPLY any lotions, deodorants, cologne, or perfumes.   Do not bring valuables to the hospital. [facility] is not responsible for any belongings/valuables. Do not wear nail polish, gel polish, artificial nails, or any other type of covering on natural nails (fingers and toes) Do not wear jewelry or makeup Put on clean/comfortable clothes.  Please brush your teeth.  Ask your nurse before applying any prescription medications to the skin.     CHG Compatible  Lotions   Aveeno Moisturizing lotion  Cetaphil Moisturizing Cream  Cetaphil Moisturizing Lotion  Clairol Herbal Essence Moisturizing Lotion, Dry Skin  Clairol Herbal Essence Moisturizing Lotion, Extra Dry Skin  Clairol Herbal Essence Moisturizing Lotion, Normal Skin  Curel Age Defying Therapeutic Moisturizing Lotion with Alpha Hydroxy  Curel Extreme Care Body Lotion  Curel Soothing Hands Moisturizing Hand Lotion  Curel Therapeutic Moisturizing Cream, Fragrance-Free  Curel Therapeutic Moisturizing Lotion, Fragrance-Free  Curel Therapeutic Moisturizing Lotion, Original Formula  Eucerin Daily Replenishing Lotion  Eucerin Dry Skin Therapy Plus Alpha Hydroxy Crme  Eucerin Dry Skin Therapy Plus Alpha Hydroxy Lotion  Eucerin Original Crme  Eucerin Original Lotion  Eucerin Plus Crme Eucerin Plus Lotion  Eucerin TriLipid Replenishing Lotion  Keri Anti-Bacterial Hand Lotion  Keri Deep Conditioning Original Lotion Dry Skin Formula Softly Scented  Keri Deep Conditioning Original Lotion, Fragrance Free Sensitive Skin Formula  Keri Lotion Fast Absorbing Fragrance Free Sensitive Skin Formula  Keri Lotion Fast Absorbing Softly Scented Dry Skin Formula  Keri Original Lotion  Keri Skin Renewal Lotion Keri Silky Smooth Lotion  Keri Silky Smooth Sensitive Skin Lotion  Nivea Body Creamy Conditioning Oil  Nivea Body Extra Enriched Lotion  Nivea Body Original Lotion  Nivea Body Sheer Moisturizing Lotion Nivea Crme  Nivea Skin Firming Lotion  NutraDerm 30 Skin Lotion  NutraDerm Skin Lotion  NutraDerm Therapeutic Skin Cream  NutraDerm Therapeutic Skin Lotion  ProShield Protective Hand Cream  Provon moisturizing lotion  Please read over the following fact sheets that you were given.

## 2023-01-28 ENCOUNTER — Encounter (HOSPITAL_COMMUNITY): Payer: Self-pay

## 2023-01-28 ENCOUNTER — Other Ambulatory Visit: Payer: Self-pay

## 2023-01-28 ENCOUNTER — Encounter (HOSPITAL_COMMUNITY)
Admission: RE | Admit: 2023-01-28 | Discharge: 2023-01-28 | Disposition: A | Discharge status: Home / Self Care | Payer: Medicare HMO | Source: Ambulatory Visit | Attending: Orthopaedic Surgery | Admitting: Orthopaedic Surgery

## 2023-01-28 VITALS — BP 132/77 | HR 83 | Temp 98.2°F | Resp 18 | Ht 60.5 in | Wt 159.2 lb

## 2023-01-28 DIAGNOSIS — M1612 Unilateral primary osteoarthritis, left hip: Secondary | ICD-10-CM | POA: Insufficient documentation

## 2023-01-28 DIAGNOSIS — I10 Essential (primary) hypertension: Secondary | ICD-10-CM | POA: Insufficient documentation

## 2023-01-28 DIAGNOSIS — Z01812 Encounter for preprocedural laboratory examination: Secondary | ICD-10-CM | POA: Diagnosis not present

## 2023-01-28 DIAGNOSIS — Z01818 Encounter for other preprocedural examination: Secondary | ICD-10-CM

## 2023-01-28 LAB — COMPREHENSIVE METABOLIC PANEL
ALT: 20 U/L (ref 0–44)
AST: 18 U/L (ref 15–41)
Albumin: 3.7 g/dL (ref 3.5–5.0)
Alkaline Phosphatase: 87 U/L (ref 38–126)
Anion gap: 7 (ref 5–15)
BUN: 14 mg/dL (ref 8–23)
CO2: 31 mmol/L (ref 22–32)
Calcium: 9.4 mg/dL (ref 8.9–10.3)
Chloride: 101 mmol/L (ref 98–111)
Creatinine, Ser: 0.8 mg/dL (ref 0.44–1.00)
GFR, Estimated: >60 mL/min (ref >60)
Glucose, Bld: 112 mg/dL — ABNORMAL HIGH (ref 70–99)
Potassium: 3.8 mmol/L (ref 3.5–5.1)
Sodium: 139 mmol/L (ref 135–145)
Total Bilirubin: 0.4 mg/dL (ref 0.3–1.2)
Total Protein: 7.6 g/dL (ref 6.5–8.1)

## 2023-01-28 LAB — CBC
HCT: 43.4 % (ref 36.0–46.0)
Hemoglobin: 14 g/dL (ref 12.0–15.0)
MCH: 30.1 pg (ref 26.0–34.0)
MCHC: 32.3 g/dL (ref 30.0–36.0)
MCV: 93.3 fL (ref 80.0–100.0)
Platelets: 299 10*3/uL (ref 150–400)
RBC: 4.65 MIL/uL (ref 3.87–5.11)
RDW: 14.4 % (ref 11.5–15.5)
WBC: 7.5 10*3/uL (ref 4.0–10.5)
nRBC: 0 % (ref 0.0–0.2)

## 2023-01-28 LAB — TYPE AND SCREEN
ABO/RH(D): O POS
Antibody Screen: NEGATIVE

## 2023-01-28 LAB — SURGICAL PCR SCREEN
MRSA, PCR: NEGATIVE
Staphylococcus aureus: POSITIVE — AB

## 2023-01-28 NOTE — Progress Notes (Signed)
PCP - Gershon Crane Cardiologist - Denies  PPM/ICD - Denies Device Orders - N/A Rep Notified -   Chest x-ray -  EKG - 09-25-22 Stress Test -  ECHO -  Cardiac Cath -   Sleep Study - Denies CPAP - Denies  DM N/A  Blood Thinner Instructions:No Aspirin Instructions: No  ERAS Protcol - yes with drink until 4:30 A.M. PRE-SURGERY Ensure    COVID TEST- N/A   Anesthesia review: No.  Patient denies shortness of breath, fever, cough and chest pain at PAT appointment   All instructions explained to the patient, with a verbal understanding of the material. Patient agrees to go over the instructions while at home for a better understanding. Patient also instructed to self quarantine after being tested for COVID-19. The opportunity to ask questions was provided.

## 2023-01-31 NOTE — Telephone Encounter (Signed)
I called patient and left voice mail for return call to discuss. 

## 2023-02-01 ENCOUNTER — Ambulatory Visit: Payer: Medicare HMO | Admitting: Family Medicine

## 2023-02-01 ENCOUNTER — Encounter: Payer: Self-pay | Admitting: Family Medicine

## 2023-02-01 VITALS — BP 126/80 | HR 98 | Temp 98.7°F | Wt 158.2 lb

## 2023-02-01 DIAGNOSIS — R509 Fever, unspecified: Secondary | ICD-10-CM | POA: Diagnosis not present

## 2023-02-01 DIAGNOSIS — R52 Pain, unspecified: Secondary | ICD-10-CM

## 2023-02-01 DIAGNOSIS — R112 Nausea with vomiting, unspecified: Secondary | ICD-10-CM | POA: Diagnosis not present

## 2023-02-01 DIAGNOSIS — K529 Noninfective gastroenteritis and colitis, unspecified: Secondary | ICD-10-CM | POA: Diagnosis not present

## 2023-02-01 LAB — POC URINALSYSI DIPSTICK (AUTOMATED)
Bilirubin, UA: NEGATIVE
Blood, UA: NEGATIVE
Clarity, UA: NEGATIVE
Color, UA: NEGATIVE
Glucose, UA: NEGATIVE
Ketones, UA: NEGATIVE
Leukocytes, UA: NEGATIVE
Nitrite, UA: NEGATIVE
Protein, UA: POSITIVE — AB
Spec Grav, UA: >=1.03 — AB (ref 1.010–1.025)
Urobilinogen, UA: 0.2 E.U./dL
pH, UA: 5 (ref 5.0–8.0)

## 2023-02-01 LAB — POC COVID19 BINAXNOW: SARS Coronavirus 2 Ag: NEGATIVE

## 2023-02-01 MED ORDER — CIPROFLOXACIN HCL 500 MG PO TABS: 500.0000 mg | ORAL_TABLET | Freq: Two times a day (BID) | ORAL | 0 refills | Status: DC

## 2023-02-01 MED ORDER — ONDANSETRON HCL 8 MG PO TABS: 8.0000 mg | ORAL_TABLET | Freq: Three times a day (TID) | ORAL | 3 refills | Status: AC | PRN

## 2023-02-01 NOTE — Progress Notes (Signed)
   Subjective:    Patient ID: Aimee Lawrence, female    DOB: Aug 16, 1945, 77 y.o.   MRN: [id number]  HPI Here for 3 days of nausea and vomiting with diarrhea. No abdominal pain or fever. No recent travelling.    Review of Systems  Constitutional: Negative.   Respiratory: Negative.    Cardiovascular: Negative.   Gastrointestinal:  Positive for diarrhea, nausea and vomiting. Negative for abdominal distention, abdominal pain, blood in stool and constipation.       Objective:   Physical Exam Constitutional:      Appearance: Normal appearance. She is not ill-appearing.  Cardiovascular:     Rate and Rhythm: Normal rate and regular rhythm.     Pulses: Normal pulses.     Heart sounds: Normal heart sounds.  Pulmonary:     Effort: Pulmonary effort is normal.     Breath sounds: Normal breath sounds.  Abdominal:     General: Abdomen is flat. Bowel sounds are normal. There is no distension.     Palpations: Abdomen is soft. There is no mass.     Tenderness: There is no abdominal tenderness. There is no right CVA tenderness, guarding or rebound.     Hernia: No hernia is present.  Neurological:     Mental Status: She is alert.           Assessment & Plan:  Enteritis, treat with 5 days of Cipro. Use Zofran for nausea. She is scheduled for a hip replacement surgery next week.  Gershon Crane, MD

## 2023-02-01 NOTE — Addendum Note (Signed)
Addended by: Carola Rhine on: 02/01/2023 04:37 PM   Modules accepted: Orders

## 2023-02-01 NOTE — Telephone Encounter (Signed)
Spoke with patient, she would like you to call her about pros and cons of doing right vs. left hip 1st.  Also, she is going to have COVID test this afternoon.  She is not feeling well.  PLEASE call her.  Thanks!

## 2023-02-02 ENCOUNTER — Other Ambulatory Visit: Payer: Self-pay

## 2023-02-04 ENCOUNTER — Telehealth: Payer: Self-pay | Admitting: Orthopaedic Surgery

## 2023-02-04 NOTE — Telephone Encounter (Signed)
Patient called advised she do not have Covid.    Phone number   [phone]

## 2023-02-08 ENCOUNTER — Ambulatory Visit (HOSPITAL_COMMUNITY): Admission: RE | Admit: 2023-02-08 | Payer: Medicare HMO | Source: Home / Self Care | Admitting: Orthopaedic Surgery

## 2023-02-08 DIAGNOSIS — M1612 Unilateral primary osteoarthritis, left hip: Secondary | ICD-10-CM

## 2023-02-08 SURGERY — ARTHROPLASTY, HIP, TOTAL, ANTERIOR APPROACH
Anesthesia: Spinal | Site: Hip | Laterality: Right

## 2023-02-09 ENCOUNTER — Encounter: Payer: Self-pay | Admitting: Family Medicine

## 2023-02-09 ENCOUNTER — Ambulatory Visit (INDEPENDENT_AMBULATORY_CARE_PROVIDER_SITE_OTHER): Payer: Medicare HMO | Admitting: Family Medicine

## 2023-02-09 VITALS — BP 140/80 | HR 97 | Temp 98.4°F | Ht 60.5 in | Wt 158.2 lb

## 2023-02-09 DIAGNOSIS — G4700 Insomnia, unspecified: Secondary | ICD-10-CM | POA: Diagnosis not present

## 2023-02-09 DIAGNOSIS — N9089 Other specified noninflammatory disorders of vulva and perineum: Secondary | ICD-10-CM

## 2023-02-09 MED ORDER — LORAZEPAM 0.5 MG PO TABS: 0.5000 mg | ORAL_TABLET | Freq: Every day | ORAL | 5 refills | Status: AC

## 2023-02-09 NOTE — Progress Notes (Signed)
   Subjective:    Patient ID: Aimee Lawrence, female    DOB: 1945-07-25, 77 y.o.   MRN: [id number]  HPI Here for 2 issues. First she noticed the appearance of a slightly tender lump on the labia 2 days ago. Of note she recently got over an enteritis infection that gave her a lot of diarrhea. The other issue is trouble relaxing at night to go to sleep. She has tried a CBD gummie, but this made her feel "weird".    Review of Systems  Constitutional: Negative.   Respiratory: Negative.    Cardiovascular: Negative.   Psychiatric/Behavioral:  Positive for sleep disturbance. Negative for dysphoric mood. The patient is not nervous/anxious.        Objective:   Physical Exam Constitutional:      Appearance: Normal appearance.  Cardiovascular:     Rate and Rhythm: Normal rate and regular rhythm.     Pulses: Normal pulses.     Heart sounds: Normal heart sounds.  Pulmonary:     Effort: Pulmonary effort is normal.     Breath sounds: Normal breath sounds.  Genitourinary:    Comments: There are 2 flaps of skin along the right labia majora which are somewhat red and tender Neurological:     Mental Status: She is alert.           Assessment & Plan:  She has some inflammation of the labia from the recent diarrhea. This should settle down in the next few days. If not, she will follow up. For the insomnia, she will try Lorazepam 0.5 mg at bedtime as needed. Gershon Crane, MD

## 2023-02-16 DIAGNOSIS — J301 Allergic rhinitis due to pollen: Secondary | ICD-10-CM | POA: Diagnosis not present

## 2023-02-16 DIAGNOSIS — J3081 Allergic rhinitis due to animal (cat) (dog) hair and dander: Secondary | ICD-10-CM | POA: Diagnosis not present

## 2023-02-16 DIAGNOSIS — J3089 Other allergic rhinitis: Secondary | ICD-10-CM | POA: Diagnosis not present

## 2023-02-17 NOTE — Progress Notes (Signed)
Called patient to update on surgery location. Updated patient that her surgery is now at [facility]. She is to arrive at the main entrance on 02/25/23 at 0515. Instructed to follow any other instructions she was given at her PST appointment. Gave number to call if she has any questions. Patient verbalized understanding.

## 2023-02-17 NOTE — Progress Notes (Addendum)
Please place orders for upcoming surgery scheduled 02/25/23.

## 2023-02-21 ENCOUNTER — Encounter: Payer: Medicare HMO | Admitting: Orthopaedic Surgery

## 2023-02-23 DIAGNOSIS — J3089 Other allergic rhinitis: Secondary | ICD-10-CM | POA: Diagnosis not present

## 2023-02-23 DIAGNOSIS — J301 Allergic rhinitis due to pollen: Secondary | ICD-10-CM | POA: Diagnosis not present

## 2023-02-23 DIAGNOSIS — J3081 Allergic rhinitis due to animal (cat) (dog) hair and dander: Secondary | ICD-10-CM | POA: Diagnosis not present

## 2023-02-24 ENCOUNTER — Telehealth: Payer: Self-pay | Admitting: *Deleted

## 2023-02-24 NOTE — Care Plan (Signed)
[facility] RNCM call to patient to discuss her upcoming Right total hip arthroplasty with Dr. Magnus Ivan on 02/25/23. She is an [facility] patient and is agreeable to case management. She has friends that will be assisting her at home after discharge. She has a RW already. Anticipate HHPT will be needed after a short hospital stay. Referral made to [facility] after choice provided. Reviewed post op care instructions. Will continue to follow for needs.

## 2023-02-24 NOTE — Telephone Encounter (Signed)
[facility] pre-op call completed. 

## 2023-02-24 NOTE — H&P (Addendum)
TOTAL HIP ADMISSION H&P  Patient is admitted for right total hip arthroplasty.  Subjective:  Chief Complaint: left hip pain  HPI: Clancy Gourd, 77 y.o. female, has a history of pain and functional disability in the right hip(s) due to arthritis and patient has failed non-surgical conservative treatments for greater than 12 weeks to include NSAID's and/or analgesics, use of assistive devices, and activity modification.  Onset of symptoms was gradual starting 4 years ago with gradually worsening course since that time.The patient noted no past surgery on the right hip(s).  Patient currently rates pain in the righthip at 10 out of 10 with activity. Patient has night pain, worsening of pain with activity and weight bearing, trendelenberg gait, pain that interfers with activities of daily living, pain with passive range of motion, and crepitus. Patient has evidence of subchondral cysts, subchondral sclerosis, periarticular osteophytes, and joint space narrowing by imaging studies. This condition presents safety issues increasing the risk of falls.  There is no current active infection.  Patient Active Problem List   Diagnosis Date Noted   Insomnia 02/09/2023   Aortic atherosclerosis (HCC) 10/29/2022   Abnormal finding on imaging 08/29/2017   Right hip pain 08/15/2017   S/P TKR (total knee replacement), right 08/11/2017   Unilateral primary osteoarthritis, left hip 08/11/2017   Primary localized osteoarthritis of right knee 06/15/2016   Primary osteoarthritis of right knee 06/15/2016   Nasal polyps 11/20/2012   Essential hypertension 05/28/2010   DEPRESSION 02/28/2007   Asthma 02/28/2007   Osteopenia 02/28/2007   Personal hx colon polyps 02/28/2007   Past Medical History:  Diagnosis Date   Allergy    takes Doxycycline daily and uses Flonase daily   Arthritis    Asthma    Albuterol inhaler as needed.   Dyspnea    rarely and sitting   GERD (gastroesophageal reflux disease)    not  on any meds   History of bronchitis    History of shingles    Hx of colonic polyps    benign   Joint pain    Joint swelling    Osteopenia    takes Vit D3 and Calcium daily   Primary localized osteoarthritis of right knee     Past Surgical History:  Procedure Laterality Date   APPENDECTOMY     CHOLECYSTECTOMY  2007   lap choli   COLONOSCOPY  10/22/2015   per Dr. Leone Payor, no polyps, repeat in 5 yrs    KNEE CLOSED REDUCTION Right 09/14/2016   Procedure: CLOSED MANIPULATION KNEE;  Surgeon: Marcene Corning, MD;  Location: [facility] OR;  Service: Orthopedics;  Laterality: Right;   NASAL POLYP SURGERY  2007   SINUS ENDO W/FUSION  06/27/2012   Procedure: ENDOSCOPIC SINUS SURGERY WITH FUSION NAVIGATION;  Surgeon: Drema Halon, MD;  Location: [facility];  Service: ENT;  Laterality: N/A;  FESS FUSION PROTOCOL   TOTAL KNEE ARTHROPLASTY Right 06/15/2016   Procedure: RIGHT TOTAL KNEE ARTHROPLASTY;  Surgeon: Marcene Corning, MD;  Location: [facility] OR;  Service: Orthopedics;  Laterality: Right;    No current facility-administered medications for this encounter.   Current Outpatient Medications  Medication Sig Dispense Refill Last Dose   albuterol (VENTOLIN HFA) 108 (90 Base) MCG/ACT inhaler Inhale 1-2 puffs into the lungs every 6 (six) hours as needed for wheezing or shortness of breath. 8.5 g 0    atorvastatin (LIPITOR) 10 MG tablet Take 1 tablet (10 mg total) by mouth daily. 90 tablet 3    Calcium  Carbonate-Vitamin D (CALCIUM 500/D PO) Take 2 tablets by mouth daily. 500 mg-200 mg      Cholecalciferol (VITAMIN D3) 2000 units capsule Take 4,000 Units by mouth daily.       EPINEPHrine 0.3 mg/0.3 mL IJ SOAJ injection Inject 0.3 mg into the muscle as needed for anaphylaxis.      fluticasone (FLONASE) 50 MCG/ACT nasal spray Place 1 spray into both nostrils daily.       LORazepam (ATIVAN) 0.5 MG tablet Take 1 tablet (0.5 mg total) by mouth at bedtime. 30 tablet 5    MAGNESIUM CITRATE PO Take  250 mg by mouth daily.      montelukast (SINGULAIR) 10 MG tablet Take 1 tablet by mouth once daily or as directed. 90 tablet 3    omeprazole (PRILOSEC) 40 MG capsule TAKE 1 CAPSULE BY MOUTH TWICE A DAY WITH A MEAL (Patient taking differently: Take 40 mg by mouth 2 (two) times daily.) 60 capsule 1    ondansetron (ZOFRAN) 8 MG tablet Take 1 tablet (8 mg total) by mouth every 8 (eight) hours as needed for nausea or vomiting. 60 tablet 3    traMADol (ULTRAM) 50 MG tablet TAKE 2 TABLETS BY MOUTH EVERY 6 HOURS AS NEEDED (Patient taking differently: Take 50 mg by mouth every 6 (six) hours as needed for moderate pain.) 180 tablet 2    Allergies  Allergen Reactions   Aleve [Naproxen Sodium] Shortness Of Breath   Aspirin Shortness Of Breath    Trouble breathing   Ibuprofen Shortness Of Breath    Short of breath   Ampicillin Rash     Has patient had a PCN reaction causing immediate rash, facial/tongue/throat swelling, SOB or lightheadedness with hypotension:   # # NO # #  Has patient had a PCN reaction that required hospitalization    # # NO # #  Has patient had a PCN reaction occurring within the last 10 years:    # # NO # #  If all of the above answers are "NO", then may proceed with Cephalosporin use.    Erythromycin Nausea And Vomiting    Social History   Tobacco Use   Smoking status: Never   Smokeless tobacco: Never  Substance Use Topics   Alcohol use: No    Alcohol/week: 0.0 standard drinks of alcohol    Comment: no etoh     Family History  Problem Relation Age of Onset   Breast cancer Mother    Pneumonia Mother    AAA (abdominal aortic aneurysm) Father    Alcohol abuse Other    Arthritis Other    Diabetes Other    Heart disease Other    Colon cancer Neg Hx    Stomach cancer Neg Hx      Review of Systems  Neurological:  Headaches: right.    Objective:  Physical Exam Vitals reviewed.  Constitutional:      Appearance: Normal appearance.  HENT:     Head: Normocephalic  and atraumatic.  Eyes:     Extraocular Movements: Extraocular movements intact.     Pupils: Pupils are equal, round, and reactive to light.  Cardiovascular:     Rate and Rhythm: Normal rate.     Pulses: Normal pulses.  Pulmonary:     Effort: Pulmonary effort is normal.     Breath sounds: Normal breath sounds.  Abdominal:     Palpations: Abdomen is soft.  Musculoskeletal:     Cervical back: Normal range of motion and  neck supple.     Right hip: Tenderness and bony tenderness present. Decreased range of motion. Decreased strength.  Neurological:     Mental Status: She is alert and oriented to person, place, and time.  Psychiatric:        Behavior: Behavior normal.     Vital signs in last 24 hours:    Labs:   Estimated body mass index is 30.39 kg/m as calculated from the following:   Height as of 02/09/23: 5' 0.5" (1.537 m).   Weight as of 02/09/23: 71.8 kg.   Imaging Review Plain radiographs demonstrate severe degenerative joint disease of the right hip(s). The bone quality appears to be good for age and reported activity level.      Assessment/Plan:  End stage arthritis, righthip(s)  The patient history, physical examination, clinical judgement of the provider and imaging studies are consistent with end stage degenerative joint disease of the right hip(s) and total hip arthroplasty is deemed medically necessary. The treatment options including medical management, injection therapy, arthroscopy and arthroplasty were discussed at length. The risks and benefits of total hip arthroplasty were presented and reviewed. The risks due to aseptic loosening, infection, stiffness, dislocation/subluxation,  thromboembolic complications and other imponderables were discussed.  The patient acknowledged the explanation, agreed to proceed with the plan and consent was signed. Patient is being admitted for inpatient treatment for surgery, pain control, PT, OT, prophylactic antibiotics, VTE  prophylaxis, progressive ambulation and ADL's and discharge planning.The patient is planning to be discharged home with home health services

## 2023-02-25 ENCOUNTER — Other Ambulatory Visit: Payer: Self-pay

## 2023-02-25 ENCOUNTER — Ambulatory Visit (HOSPITAL_COMMUNITY): Payer: Medicare HMO | Admitting: Registered Nurse

## 2023-02-25 ENCOUNTER — Ambulatory Visit (HOSPITAL_BASED_OUTPATIENT_CLINIC_OR_DEPARTMENT_OTHER): Payer: Medicare HMO | Admitting: Registered Nurse

## 2023-02-25 ENCOUNTER — Encounter (HOSPITAL_COMMUNITY): Payer: Self-pay | Admitting: Orthopaedic Surgery

## 2023-02-25 ENCOUNTER — Encounter (HOSPITAL_COMMUNITY)
Admission: RE | Disposition: A | Discharge status: Home / Self Care | Payer: Self-pay | Source: Home / Self Care | Attending: Orthopaedic Surgery

## 2023-02-25 ENCOUNTER — Ambulatory Visit (HOSPITAL_COMMUNITY): Payer: Medicare HMO

## 2023-02-25 ENCOUNTER — Observation Stay (HOSPITAL_COMMUNITY)
Admission: RE | Admit: 2023-02-25 | Discharge: 2023-02-26 | Disposition: A | Discharge status: Home / Self Care | Payer: Medicare HMO | Attending: Orthopaedic Surgery | Admitting: Orthopaedic Surgery

## 2023-02-25 ENCOUNTER — Observation Stay (HOSPITAL_COMMUNITY): Payer: Medicare HMO

## 2023-02-25 DIAGNOSIS — Z79899 Other long term (current) drug therapy: Secondary | ICD-10-CM | POA: Insufficient documentation

## 2023-02-25 DIAGNOSIS — Z96641 Presence of right artificial hip joint: Secondary | ICD-10-CM | POA: Diagnosis not present

## 2023-02-25 DIAGNOSIS — J45909 Unspecified asthma, uncomplicated: Secondary | ICD-10-CM | POA: Diagnosis not present

## 2023-02-25 DIAGNOSIS — Z471 Aftercare following joint replacement surgery: Secondary | ICD-10-CM | POA: Diagnosis not present

## 2023-02-25 DIAGNOSIS — M1611 Unilateral primary osteoarthritis, right hip: Secondary | ICD-10-CM

## 2023-02-25 DIAGNOSIS — Z96651 Presence of right artificial knee joint: Secondary | ICD-10-CM | POA: Insufficient documentation

## 2023-02-25 DIAGNOSIS — I10 Essential (primary) hypertension: Secondary | ICD-10-CM | POA: Diagnosis not present

## 2023-02-25 DIAGNOSIS — M1612 Unilateral primary osteoarthritis, left hip: Secondary | ICD-10-CM | POA: Diagnosis present

## 2023-02-25 HISTORY — PX: TOTAL HIP ARTHROPLASTY: SHX124

## 2023-02-25 LAB — TYPE AND SCREEN
ABO/RH(D): O POS
Antibody Screen: NEGATIVE

## 2023-02-25 SURGERY — ARTHROPLASTY, HIP, TOTAL, ANTERIOR APPROACH
Anesthesia: Spinal | Site: Hip | Laterality: Right

## 2023-02-25 MED ORDER — FENTANYL CITRATE PF 50 MCG/ML IJ SOSY
25.0000 ug | PREFILLED_SYRINGE | INTRAMUSCULAR | Status: DC | PRN
  Administered 2023-02-25: 25 ug via INTRAVENOUS
  Administered 2023-02-25: 50 ug via INTRAVENOUS

## 2023-02-25 MED ORDER — APIXABAN 2.5 MG PO TABS
2.5000 mg | ORAL_TABLET | Freq: Two times a day (BID) | ORAL | Status: DC
  Administered 2023-02-26: 2.5 mg via ORAL
  Filled 2023-02-25: qty 1

## 2023-02-25 MED ORDER — FENTANYL CITRATE PF 50 MCG/ML IJ SOSY
PREFILLED_SYRINGE | INTRAMUSCULAR | Status: AC
  Administered 2023-02-25: 50 ug via INTRAVENOUS
  Filled 2023-02-25: qty 1

## 2023-02-25 MED ORDER — STERILE WATER FOR IRRIGATION IR SOLN
Status: DC | PRN
  Administered 2023-02-25: 2000 mL

## 2023-02-25 MED ORDER — CEFAZOLIN SODIUM-DEXTROSE 1-4 GM/50ML-% IV SOLN
1.0000 g | Freq: Four times a day (QID) | INTRAVENOUS | Status: AC
  Administered 2023-02-25 (×2): 1 g via INTRAVENOUS
  Filled 2023-02-25 (×2): qty 50

## 2023-02-25 MED ORDER — ATORVASTATIN CALCIUM 10 MG PO TABS
10.0000 mg | ORAL_TABLET | Freq: Every day | ORAL | Status: DC
  Administered 2023-02-25 – 2023-02-26 (×2): 10 mg via ORAL
  Filled 2023-02-25 (×2): qty 1

## 2023-02-25 MED ORDER — CHLORHEXIDINE GLUCONATE 0.12 % MT SOLN
15.0000 mL | Freq: Once | OROMUCOSAL | Status: AC
  Administered 2023-02-25: 15 mL via OROMUCOSAL

## 2023-02-25 MED ORDER — 0.9 % SODIUM CHLORIDE (POUR BTL) OPTIME
TOPICAL | Status: DC | PRN
  Administered 2023-02-25: 1000 mL

## 2023-02-25 MED ORDER — METOCLOPRAMIDE HCL 5 MG PO TABS: 5.0000 mg | ORAL_TABLET | Freq: Three times a day (TID) | ORAL | Status: DC | PRN

## 2023-02-25 MED ORDER — CEFAZOLIN SODIUM-DEXTROSE 2-4 GM/100ML-% IV SOLN
INTRAVENOUS | Status: AC
  Filled 2023-02-25: qty 100

## 2023-02-25 MED ORDER — SODIUM CHLORIDE 0.9 % IR SOLN
Status: DC | PRN
  Administered 2023-02-25: 1000 mL

## 2023-02-25 MED ORDER — PHENYLEPHRINE HCL-NACL 20-0.9 MG/250ML-% IV SOLN
INTRAVENOUS | Status: DC | PRN
  Administered 2023-02-25: 25 ug/min via INTRAVENOUS

## 2023-02-25 MED ORDER — ACETAMINOPHEN 10 MG/ML IV SOLN
INTRAVENOUS | Status: AC
  Filled 2023-02-25: qty 100

## 2023-02-25 MED ORDER — ACETAMINOPHEN 10 MG/ML IV SOLN
INTRAVENOUS | Status: DC | PRN
  Administered 2023-02-25: 1000 mg via INTRAVENOUS

## 2023-02-25 MED ORDER — METHOCARBAMOL 500 MG IVPB - SIMPLE MED
INTRAVENOUS | Status: AC
  Filled 2023-02-25: qty 55

## 2023-02-25 MED ORDER — ONDANSETRON HCL 4 MG/2ML IJ SOLN: 4.0000 mg | Freq: Four times a day (QID) | INTRAMUSCULAR | Status: DC | PRN

## 2023-02-25 MED ORDER — METHOCARBAMOL 500 MG IVPB - SIMPLE MED
500.0000 mg | Freq: Four times a day (QID) | INTRAVENOUS | Status: DC | PRN
  Administered 2023-02-25: 500 mg via INTRAVENOUS

## 2023-02-25 MED ORDER — VITAMIN D 25 MCG (1000 UNIT) PO TABS
4000.0000 [IU] | ORAL_TABLET | Freq: Every day | ORAL | Status: DC
  Administered 2023-02-25 – 2023-02-26 (×2): 4000 [IU] via ORAL
  Filled 2023-02-25 (×2): qty 4

## 2023-02-25 MED ORDER — ONDANSETRON HCL 4 MG/2ML IJ SOLN
INTRAMUSCULAR | Status: AC
  Filled 2023-02-25: qty 2

## 2023-02-25 MED ORDER — METOCLOPRAMIDE HCL 5 MG/ML IJ SOLN: 5.0000 mg | Freq: Three times a day (TID) | INTRAMUSCULAR | Status: DC | PRN

## 2023-02-25 MED ORDER — METHOCARBAMOL 500 MG PO TABS
500.0000 mg | ORAL_TABLET | Freq: Four times a day (QID) | ORAL | Status: DC | PRN
  Administered 2023-02-25: 500 mg via ORAL
  Filled 2023-02-25: qty 1

## 2023-02-25 MED ORDER — FLUTICASONE PROPIONATE 50 MCG/ACT NA SUSP
1.0000 | Freq: Every day | NASAL | Status: DC
  Administered 2023-02-26: 1 via NASAL
  Filled 2023-02-25: qty 16

## 2023-02-25 MED ORDER — ALBUTEROL SULFATE (2.5 MG/3ML) 0.083% IN NEBU: 2.5000 mg | INHALATION_SOLUTION | Freq: Four times a day (QID) | RESPIRATORY_TRACT | Status: DC | PRN

## 2023-02-25 MED ORDER — ONDANSETRON HCL 4 MG PO TABS: 4.0000 mg | ORAL_TABLET | Freq: Four times a day (QID) | ORAL | Status: DC | PRN

## 2023-02-25 MED ORDER — DEXAMETHASONE SODIUM PHOSPHATE 10 MG/ML IJ SOLN
INTRAMUSCULAR | Status: AC
  Filled 2023-02-25: qty 1

## 2023-02-25 MED ORDER — LACTATED RINGERS IV SOLN: INTRAVENOUS | Status: DC

## 2023-02-25 MED ORDER — PROPOFOL 1000 MG/100ML IV EMUL
INTRAVENOUS | Status: AC
  Filled 2023-02-25: qty 100

## 2023-02-25 MED ORDER — SODIUM CHLORIDE 0.9 % IV SOLN: INTRAVENOUS | Status: DC

## 2023-02-25 MED ORDER — PHENOL 1.4 % MT LIQD: 1.0000 | OROMUCOSAL | Status: DC | PRN

## 2023-02-25 MED ORDER — FENTANYL CITRATE (PF) 100 MCG/2ML IJ SOLN
INTRAMUSCULAR | Status: DC | PRN
  Administered 2023-02-25: 50 ug via INTRAVENOUS

## 2023-02-25 MED ORDER — MONTELUKAST SODIUM 10 MG PO TABS
10.0000 mg | ORAL_TABLET | Freq: Every day | ORAL | Status: DC
  Administered 2023-02-25: 10 mg via ORAL
  Filled 2023-02-25: qty 1

## 2023-02-25 MED ORDER — LORAZEPAM 0.5 MG PO TABS
0.5000 mg | ORAL_TABLET | Freq: Every day | ORAL | Status: DC
  Administered 2023-02-25: 0.5 mg via ORAL
  Filled 2023-02-25: qty 1

## 2023-02-25 MED ORDER — ALUM & MAG HYDROXIDE-SIMETH 200-200-20 MG/5ML PO SUSP: 30.0000 mL | ORAL | Status: DC | PRN

## 2023-02-25 MED ORDER — HYDROCODONE-ACETAMINOPHEN 7.5-325 MG PO TABS: 1.0000 | ORAL_TABLET | ORAL | Status: DC | PRN

## 2023-02-25 MED ORDER — TRANEXAMIC ACID-NACL 1000-0.7 MG/100ML-% IV SOLN
1000.0000 mg | INTRAVENOUS | Status: AC
  Administered 2023-02-25: 1000 mg via INTRAVENOUS

## 2023-02-25 MED ORDER — DIPHENHYDRAMINE HCL 12.5 MG/5ML PO ELIX
12.5000 mg | ORAL_SOLUTION | ORAL | Status: DC | PRN
  Administered 2023-02-25: 25 mg via ORAL
  Filled 2023-02-25: qty 10

## 2023-02-25 MED ORDER — ALBUTEROL SULFATE HFA 108 (90 BASE) MCG/ACT IN AERS: 1.0000 | INHALATION_SPRAY | Freq: Four times a day (QID) | RESPIRATORY_TRACT | Status: DC | PRN

## 2023-02-25 MED ORDER — DEXAMETHASONE SODIUM PHOSPHATE 10 MG/ML IJ SOLN
INTRAMUSCULAR | Status: DC | PRN
  Administered 2023-02-25: 8 mg via INTRAVENOUS

## 2023-02-25 MED ORDER — MORPHINE SULFATE (PF) 2 MG/ML IV SOLN
0.5000 mg | INTRAVENOUS | Status: DC | PRN
  Administered 2023-02-25: 1 mg via INTRAVENOUS
  Filled 2023-02-25: qty 1

## 2023-02-25 MED ORDER — FENTANYL CITRATE (PF) 100 MCG/2ML IJ SOLN
INTRAMUSCULAR | Status: AC
  Filled 2023-02-25: qty 2

## 2023-02-25 MED ORDER — PROPOFOL 500 MG/50ML IV EMUL
INTRAVENOUS | Status: DC | PRN
  Administered 2023-02-25: 40 ug/kg/min via INTRAVENOUS

## 2023-02-25 MED ORDER — TRANEXAMIC ACID-NACL 1000-0.7 MG/100ML-% IV SOLN
INTRAVENOUS | Status: AC
  Filled 2023-02-25: qty 100

## 2023-02-25 MED ORDER — DOCUSATE SODIUM 100 MG PO CAPS
100.0000 mg | ORAL_CAPSULE | Freq: Two times a day (BID) | ORAL | Status: DC
  Administered 2023-02-25 – 2023-02-26 (×2): 100 mg via ORAL
  Filled 2023-02-25 (×2): qty 1

## 2023-02-25 MED ORDER — HYDROCODONE-ACETAMINOPHEN 5-325 MG PO TABS
1.0000 | ORAL_TABLET | ORAL | Status: DC | PRN
  Administered 2023-02-25 – 2023-02-26 (×4): 2 via ORAL
  Filled 2023-02-25 (×5): qty 2

## 2023-02-25 MED ORDER — FENTANYL CITRATE PF 50 MCG/ML IJ SOSY
PREFILLED_SYRINGE | INTRAMUSCULAR | Status: AC
  Filled 2023-02-25: qty 1

## 2023-02-25 MED ORDER — BUPIVACAINE IN DEXTROSE 0.75-8.25 % IT SOLN
INTRATHECAL | Status: DC | PRN
  Administered 2023-02-25: 1.8 mL via INTRATHECAL

## 2023-02-25 MED ORDER — PANTOPRAZOLE SODIUM 40 MG PO TBEC
40.0000 mg | DELAYED_RELEASE_TABLET | Freq: Every day | ORAL | Status: DC
  Administered 2023-02-25 – 2023-02-26 (×2): 40 mg via ORAL
  Filled 2023-02-25 (×2): qty 1

## 2023-02-25 MED ORDER — ORAL CARE MOUTH RINSE: 15.0000 mL | Freq: Once | OROMUCOSAL | Status: AC

## 2023-02-25 MED ORDER — ONDANSETRON HCL 4 MG/2ML IJ SOLN
INTRAMUSCULAR | Status: DC | PRN
  Administered 2023-02-25: 4 mg via INTRAVENOUS

## 2023-02-25 MED ORDER — ACETAMINOPHEN 325 MG PO TABS: 325.0000 mg | ORAL_TABLET | Freq: Four times a day (QID) | ORAL | Status: DC | PRN

## 2023-02-25 MED ORDER — CEFAZOLIN SODIUM-DEXTROSE 2-4 GM/100ML-% IV SOLN
2.0000 g | Freq: Once | INTRAVENOUS | Status: AC
  Administered 2023-02-25: 2 g via INTRAVENOUS

## 2023-02-25 MED ORDER — MENTHOL 3 MG MT LOZG: 1.0000 | LOZENGE | OROMUCOSAL | Status: DC | PRN

## 2023-02-25 SURGICAL SUPPLY — 43 items
APL SKNCLS STERI-STRIP NONHPOA (GAUZE/BANDAGES/DRESSINGS)
ARTICULEZE HEAD (Hips) ×1 IMPLANT
BAG COUNTER SPONGE SURGICOUNT (BAG) ×2 IMPLANT
BAG SPEC THK2 15X12 ZIP CLS (MISCELLANEOUS) ×1
BAG SPNG CNTER NS LX DISP (BAG) ×1
BAG ZIPLOCK 12X15 (MISCELLANEOUS) IMPLANT
BENZOIN TINCTURE PRP APPL 2/3 (GAUZE/BANDAGES/DRESSINGS) IMPLANT
BLADE SAW SGTL 18X1.27X75 (BLADE) ×2 IMPLANT
COVER PERINEAL POST (MISCELLANEOUS) ×2 IMPLANT
COVER SURGICAL LIGHT HANDLE (MISCELLANEOUS) ×2 IMPLANT
DRAPE FOOT SWITCH (DRAPES) ×2 IMPLANT
DRAPE STERI IOBAN 125X83 (DRAPES) ×2 IMPLANT
DRAPE U-SHAPE 47X51 STRL (DRAPES) ×4 IMPLANT
DRSG AQUACEL AG ADV 3.5X10 (GAUZE/BANDAGES/DRESSINGS) ×2 IMPLANT
DURAPREP 26ML APPLICATOR (WOUND CARE) ×2 IMPLANT
ELECT REM PT RETURN 15FT ADLT (MISCELLANEOUS) ×2 IMPLANT
GAUZE XEROFORM 1X8 LF (GAUZE/BANDAGES/DRESSINGS) IMPLANT
GLOVE BIO SURGEON STRL SZ7.5 (GLOVE) ×2 IMPLANT
GLOVE BIOGEL PI IND STRL 8 (GLOVE) ×4 IMPLANT
GLOVE ECLIPSE 8.0 STRL XLNG CF (GLOVE) ×2 IMPLANT
GOWN STRL REUS W/ TWL XL LVL3 (GOWN DISPOSABLE) ×4 IMPLANT
GOWN STRL REUS W/TWL XL LVL3 (GOWN DISPOSABLE) ×2
HANDPIECE INTERPULSE COAX TIP (DISPOSABLE) ×1
HEAD ARTICULEZE (Hips) IMPLANT
HOLDER FOLEY CATH W/STRAP (MISCELLANEOUS) ×2 IMPLANT
KIT TURNOVER KIT A (KITS) IMPLANT
LINER NEUTRAL 52X36MM PLUS 4 (Liner) IMPLANT
PACK ANTERIOR HIP CUSTOM (KITS) ×2 IMPLANT
PIN SECTOR W/GRIP ACE CUP 52MM (Hips) IMPLANT
SET HNDPC FAN SPRY TIP SCT (DISPOSABLE) ×2 IMPLANT
STAPLER VISISTAT 35W (STAPLE) IMPLANT
STEM FEM ACTIS STD SZ4 (Stem) IMPLANT
STRIP CLOSURE SKIN 1/2X4 (GAUZE/BANDAGES/DRESSINGS) IMPLANT
SUT ETHIBOND NAB CT1 #1 30IN (SUTURE) ×2 IMPLANT
SUT ETHILON 2 0 PS N (SUTURE) IMPLANT
SUT MNCRL AB 4-0 PS2 18 (SUTURE) IMPLANT
SUT VIC AB 0 CT1 36 (SUTURE) ×2 IMPLANT
SUT VIC AB 1 CT1 36 (SUTURE) ×2 IMPLANT
SUT VIC AB 2-0 CT1 27 (SUTURE) ×2
SUT VIC AB 2-0 CT1 TAPERPNT 27 (SUTURE) ×4 IMPLANT
TRAY FOLEY MTR SLVR 14FR STAT (SET/KITS/TRAYS/PACK) IMPLANT
TRAY FOLEY MTR SLVR 16FR STAT (SET/KITS/TRAYS/PACK) IMPLANT
YANKAUER SUCT BULB TIP NO VENT (SUCTIONS) ×2 IMPLANT

## 2023-02-25 NOTE — Care Plan (Signed)
Ortho Bundle Case Management Note  Patient Details  Name: Aimee Lawrence MRN: [id number] Date of Birth: 12-03-1945    [facility] call to patient to discuss her upcoming Right total hip arthroplasty with Dr. Magnus Ivan on 02/25/23. She is an Ortho bundle patient and is agreeable to case management. She has friends that will be assisting her at home after discharge. She has a RW already. Anticipate HHPT will be needed after a short hospital stay. Referral made to [facility] after choice provided. Reviewed post op care instructions. Will continue to follow for needs.                      DME Arranged:   (Patient states she has a RW at home already;) DME Agency:     HH Arranged:  PT HH Agency:  [facility]  Additional Comments: Please contact me with any questions of if this plan should need to change.  Ralph Dowdy, RN, BSN, [employer]  [phone] 02/25/2023, 3:07 PM

## 2023-02-25 NOTE — Evaluation (Signed)
Physical Therapy Evaluation Patient Details Name: Natika Heyen MRN: [id number] DOB: 1946/03/20 Today's Date: 02/25/2023  History of Present Illness  Pt s/p R THR and with hx of R TKR and osteopenia  Clinical Impression  Pt s/p R THR and presents with decreased R LE strength/ROM and post op pain limiting functional mobility.  Pt should progress to dc home with assist of family and friends.      If plan is discharge home, recommend the following: A little help with walking and/or transfers;A little help with bathing/dressing/bathroom;Assistance with cooking/housework;Assist for transportation;Help with stairs or ramp for entrance   Can travel by private vehicle        Equipment Recommendations Rolling walker (2 wheels) (Youth RW please)  Recommendations for Other Services       Functional Status Assessment Patient has had a recent decline in their functional status and demonstrates the ability to make significant improvements in function in a reasonable and predictable amount of time.     Precautions / Restrictions Precautions Precautions: Fall Restrictions Weight Bearing Restrictions: No Other Position/Activity Restrictions: WBAT      Mobility  Bed Mobility Overal bed mobility: Needs Assistance Bed Mobility: Supine to Sit     Supine to sit: Min assist, Mod assist     General bed mobility comments: Increased time with cues for sequence and use of L LE to self assist    Transfers Overall transfer level: Needs assistance Equipment used: Rolling walker (2 wheels) Transfers: Sit to/from Stand Sit to Stand: Min assist, Mod assist, From elevated surface           General transfer comment: cues for LE management and use of UEs to self assist    Ambulation/Gait Ambulation/Gait assistance: Min assist Gait Distance (Feet): 56 Feet Assistive device: Rolling walker (2 wheels) Gait Pattern/deviations: Step-to pattern, Decreased step length - right, Decreased step  length - left, Shuffle, Trunk flexed Gait velocity: decr     General Gait Details: cues for sequence, posture and position from [employer]            Wheelchair Mobility     Tilt Bed    Modified Rankin (Stroke Patients Only)       Balance Overall balance assessment: Needs assistance Sitting-balance support: No upper extremity supported, Feet supported Sitting balance-Leahy Scale: Good     Standing balance support: Bilateral upper extremity supported Standing balance-Leahy Scale: Poor                               Pertinent Vitals/Pain Pain Assessment Pain Assessment: 0-10 Pain Score: 6  Pain Location: R hip Pain Descriptors / Indicators: Aching, Sore Pain Intervention(s): Limited activity within patient's tolerance, Monitored during session, Patient requesting pain meds-RN notified, RN gave pain meds during session, Ice applied    Home Living Family/patient expects to be discharged to:: Private residence Living Arrangements: Alone Available Help at Discharge: Family;Friend(s);Available 24 hours/day Type of Home: House Home Access: Stairs to enter;Ramped entrance Entrance Stairs-Rails: Right Entrance Stairs-Number of Steps: 5   Home Layout: One level Home Equipment: Cane - single point      Prior Function Prior Level of Function : Independent/Modified Independent             Mobility Comments: using cane as needed       Extremity/Trunk Assessment   Upper Extremity Assessment Upper Extremity Assessment: Overall WFL for tasks assessed    Lower Extremity  Assessment Lower Extremity Assessment: RLE deficits/detail    Cervical / Trunk Assessment Cervical / Trunk Assessment: Normal  Communication   Communication Communication: No apparent difficulties Cueing Techniques: Verbal cues;Tactile cues  Cognition Arousal: Alert Behavior During Therapy: WFL for tasks assessed/performed Overall Cognitive Status: Within Functional Limits  for tasks assessed                                          General Comments      Exercises Total Joint Exercises Ankle Circles/Pumps: AROM, Both, 15 reps, Supine   Assessment/Plan    PT Assessment Patient needs continued PT services  PT Problem List Decreased strength;Decreased range of motion;Decreased activity tolerance;Decreased mobility;Decreased balance;Decreased knowledge of use of DME;Pain       PT Treatment Interventions DME instruction;Gait training;Stair training;Functional mobility training;Therapeutic activities;Therapeutic exercise;Patient/family education    PT Goals (Current goals can be found in the Care Plan section)  Acute Rehab PT Goals Patient Stated Goal: Regain IND PT Goal Formulation: With patient Time For Goal Achievement: 03/04/23 Potential to Achieve Goals: Good    Frequency 7X/week     Co-evaluation               AM-PAC PT "6 Clicks" Mobility  Outcome Measure Help needed turning from your back to your side while in a flat bed without using bedrails?: A Lot Help needed moving from lying on your back to sitting on the side of a flat bed without using bedrails?: A Lot Help needed moving to and from a bed to a chair (including a wheelchair)?: A Lot Help needed standing up from a chair using your arms (e.g., wheelchair or bedside chair)?: A Little Help needed to walk in hospital room?: A Little Help needed climbing 3-5 steps with a railing? : A Lot 6 Click Score: 14    End of Session Equipment Utilized During Treatment: Gait belt Activity Tolerance: Patient tolerated treatment well Patient left: in chair;with call bell/phone within reach;with chair alarm set;with family/visitor present Nurse Communication: Mobility status PT Visit Diagnosis: Difficulty in walking, not elsewhere classified (R26.2)    Time: 7062-3762 PT Time Calculation (min) (ACUTE ONLY): 28 min   Charges:   PT Evaluation $PT Eval Low Complexity: 1  Low PT Treatments $Gait Training: 8-22 mins PT General Charges $$ ACUTE PT VISIT: 1 Visit         Mauro Kaufmann PT Acute Rehabilitation Services Pager [phone] Office [phone]   Karey Stucki 02/25/2023, 5:51 PM

## 2023-02-25 NOTE — Plan of Care (Signed)
  Problem: Activity: Goal: Ability to avoid complications of mobility impairment will improve Outcome: Progressing Goal: Ability to tolerate increased activity will improve Outcome: Progressing   Problem: Pain Management: Goal: Pain level will decrease with appropriate interventions Outcome: Progressing   Problem: Safety: Goal: Ability to remain free from injury will improve Outcome: Progressing

## 2023-02-25 NOTE — Transfer of Care (Signed)
Immediate Anesthesia Transfer of Care Note  Patient: Aimee Lawrence  Procedure(s) Performed: RIGHT TOTAL HIP ARTHROPLASTY ANTERIOR APPROACH (Right: Hip)  Patient Location: PACU  Anesthesia Type:MAC and Spinal  Level of Consciousness: awake, alert , oriented, and patient cooperative  Airway & Oxygen Therapy: Patient Spontanous Breathing and Patient connected to face mask oxygen  Post-op Assessment: Report given to RN and Post -op Vital signs reviewed and stable  Post vital signs: Reviewed and stable  Last Vitals:  Vitals Value Taken Time  BP 123/77 02/25/23 0857  Temp    Pulse 59 02/25/23 0859  Resp 13 02/25/23 0859  SpO2 100 % 02/25/23 0859  Vitals shown include unfiled device data.  Last Pain:  Vitals:   02/25/23 0630  TempSrc: Oral  PainSc:          Complications: No notable events documented.

## 2023-02-25 NOTE — TOC Transition Note (Signed)
Transition of Care [facility]) - CM/SW Discharge Note  Patient Details  Name: Aimee Lawrence MRN: [id number] Date of Birth: [birth year]  Transition [facility] CM/SW Contact:  Ewing Schlein, LCSW Phone Number: 02/25/2023, 3:59 PM  Clinical Narrative: Patient is expected to discharge home after passing with PT. CSW met with patient to confirm discharge plan. Patient will go home with HHPT through [facility], which was prearranged in orthopedist's office. Patient has a rolling walker at home, so there are no DME needs at this time. TOC signing off.    Final next level of care: Home w Home Health Services Barriers to Discharge: No Barriers Identified  Patient Goals and CMS Choice CMS Medicare.gov Compare Post Acute Care list provided to:: Patient Choice offered to / list presented to : Patient  Discharge Plan and Services Additional resources added to the After Visit Summary for          DME Arranged: N/A DME Agency: NA HH Arranged: PT HH Agency: [facility] Representative spoke with at [facility] Agency: Prearranged in orthopedist's office  Social Determinants of Health (SDOH) Interventions SDOH Screenings   Food Insecurity: No Food Insecurity (02/25/2023)  Housing: Low Risk  (02/25/2023)  Transportation Needs: No Transportation Needs (02/25/2023)  Utilities: Not At Risk (02/25/2023)  Alcohol Screen: Low Risk  (10/06/2022)  Depression (PHQ2-9): Low Risk  (11/08/2022)  Financial Resource Strain: Low Risk  (10/06/2022)  Physical Activity: Unknown (10/06/2022)  Social Connections: Unknown (10/06/2022)  Stress: No Stress Concern Present (10/06/2022)  Tobacco Use: Low Risk  (02/25/2023)   Readmission Risk Interventions     No data to display

## 2023-02-25 NOTE — Discharge Instructions (Signed)
Information on my medicine - ELIQUIS (apixaban)  Why was Eliquis prescribed for you? Eliquis was prescribed for you to reduce the risk of blood clots forming after orthopedic surgery.    What do You need to know about Eliquis? Take your Eliquis TWICE DAILY - one tablet in the morning and one tablet in the evening with or without food.  It would be best to take the dose about the same time each day.  If you have difficulty swallowing the tablet whole please discuss with your pharmacist how to take the medication safely.  Take Eliquis exactly as prescribed by your doctor and DO NOT stop taking Eliquis without talking to the doctor who prescribed the medication.  Stopping without other medication to take the place of Eliquis may increase your risk of developing a clot.  After discharge, you should have regular check-up appointments with your healthcare provider that is prescribing your Eliquis.  What do you do if you miss a dose? If a dose of ELIQUIS is not taken at the scheduled time, take it as soon as possible on the same day and twice-daily administration should be resumed.  The dose should not be doubled to make up for a missed dose.  Do not take more than one tablet of ELIQUIS at the same time.  Important Safety Information A possible side effect of Eliquis is bleeding. You should call your healthcare provider right away if you experience any of the following: Bleeding from an injury or your nose that does not stop. Unusual colored urine (red or dark brown) or unusual colored stools (red or black). Unusual bruising for unknown reasons. A serious fall or if you hit your head (even if there is no bleeding).  Some medicines may interact with Eliquis and might increase your risk of bleeding or clotting while on Eliquis. To help avoid this, consult your healthcare provider or pharmacist prior to using any new prescription or non-prescription medications, including herbals,  vitamins, non-steroidal anti-inflammatory drugs (NSAIDs) and supplements.  This website has more information on Eliquis (apixaban): http://www.eliquis.com/eliquis/home  

## 2023-02-25 NOTE — Anesthesia Procedure Notes (Signed)
Procedure Name: MAC Date/Time: 02/25/2023 7:22 AM  Performed by: Elisabeth Cara, CRNAPre-anesthesia Checklist: Patient identified, Emergency Drugs available, Suction available, Patient being monitored and Timeout performed Patient Re-evaluated:Patient Re-evaluated prior to induction Oxygen Delivery Method: Simple face mask Placement Confirmation: positive ETCO2 Dental Injury: Teeth and Oropharynx as per pre-operative assessment

## 2023-02-25 NOTE — Anesthesia Preprocedure Evaluation (Addendum)
Anesthesia Evaluation  Patient identified by MRN, date of birth, ID band Patient awake    Reviewed: Allergy & Precautions, NPO status , Patient's Chart, lab work & pertinent test results  Airway Mallampati: II  TM Distance: >3 FB Neck ROM: Full    Dental no notable dental hx. (+) Teeth Intact, Dental Advisory Given   Pulmonary asthma    Pulmonary exam normal breath sounds clear to auscultation       Cardiovascular hypertension, Normal cardiovascular exam Rhythm:Regular Rate:Normal     Neuro/Psych  PSYCHIATRIC DISORDERS  Depression    negative neurological ROS     GI/Hepatic Neg liver ROS,GERD  ,,  Endo/Other  negative endocrine ROS    Renal/GU negative Renal ROS  negative genitourinary   Musculoskeletal  (+) Arthritis ,    Abdominal   Peds  Hematology negative hematology ROS (+)   Anesthesia Other Findings   Reproductive/Obstetrics                             Anesthesia Physical Anesthesia Plan  ASA: 2  Anesthesia Plan: Spinal   Post-op Pain Management: Tylenol PO (pre-op)*   Induction:   PONV Risk Score and Plan: Treatment may vary due to age or medical condition, Propofol infusion, Dexamethasone and Ondansetron  Airway Management Planned: Natural Airway  Additional Equipment:   Intra-op Plan:   Post-operative Plan:   Informed Consent: I have reviewed the patients History and Physical, chart, labs and discussed the procedure including the risks, benefits and alternatives for the proposed anesthesia with the patient or authorized representative who has indicated his/her understanding and acceptance.     Dental advisory given  Plan Discussed with: CRNA  Anesthesia Plan Comments:        Anesthesia Quick Evaluation

## 2023-02-25 NOTE — Anesthesia Procedure Notes (Signed)
Spinal  Patient location during procedure: OR Start time: 02/25/2023 7:25 AM End time: 02/25/2023 7:27 AM Reason for block: surgical anesthesia Staffing Performed: anesthesiologist  Anesthesiologist: Elmer Picker, MD Performed by: Elmer Picker, MD Authorized by: Elmer Picker, MD   Preanesthetic Checklist Completed: patient identified, IV checked, risks and benefits discussed, surgical consent, monitors and equipment checked, pre-op evaluation and timeout performed Spinal Block Patient position: sitting Prep: DuraPrep and site prepped and draped Patient monitoring: cardiac monitor, continuous pulse ox and blood pressure Approach: midline Location: L3-4 Injection technique: single-shot Needle Needle type: Pencan  Needle gauge: 24 G Needle length: 9 cm Assessment Sensory level: T6 Events: CSF return Additional Notes Functioning IV was confirmed and monitors were applied. Sterile prep and drape, including hand hygiene and sterile gloves were used. The patient was positioned and the spine was prepped. The skin was anesthetized with lidocaine.  Free flow of clear CSF was obtained prior to injecting local anesthetic into the CSF.  The spinal needle aspirated freely following injection.  The needle was carefully withdrawn.  The patient tolerated the procedure well.

## 2023-02-25 NOTE — Op Note (Signed)
Operative Note  Date of operation: 02/25/2023 Preoperative diagnosis: Right hip primary osteoarthritis Postoperative diagnosis: Same  Procedure: Right direct anterior total hip arthroplasty  Implants: Implant Name Type Inv. Item Serial No. Manufacturer Lot No. LRB No. Used Action  PIN SECTOR W/GRIP ACE CUP - [id number] Hips PIN SECTOR W/GRIP ACE CUP  [employer] [id number] Right 1 Implanted  LINER NEUTRAL 52X36MM PLUS 4 - [id number] Liner LINER NEUTRAL [id number] PLUS 4  DEPUY ORTH[employer] [id number] Right 1 Implanted  STEM FEM ACTIS STD SZ4 - [id number] Stem STEM FEM ACTIS STD SZ4  DEPUY ORTH[employer] [id number] Right 1 Implanted  ARTICULEZE HEAD - [id number] Hips ARTICULEZE HEAD  DEPU[employer] ORTH[employer] [id number] Right 1 Implanted   Surgeon: Vanita Panda. Magnus Ivan, MD Assistant: Rexene Edison, PA-C  Anesthesia: Spinal Antibiotics: IV Ancef EBL: 400 cc Complications: None  Indications: The patient is a 77 year old female with debilitating arthritis involving her both hips.  Although radiographically her left hip looks much worse than the right, she does have right hip arthritis and that is what it hurts her the most and so that is the one that she wishes to proceed with for surgery first.  We did discuss the risk of acute blood loss anemia, nerve vessel injury, fracture, infection, DVT, dislocation, implant failure, leg length differences and wound healing issues.  She understands her goals are hopefully decrease pain, improve mobility, and improve quality of life.  Procedure description: After informed consent was obtained and the appropriate right hip was marked, the patient was brought to the operating room and sat up on the stretcher where spinal anesthesia was obtained.  She was then laid in supine position on the stretcher and a Foley catheter was placed.  Traction boots were placed on both her feet and she was placed supine on the Hana fracture table with a perineal post in place in  both legs and inline skeletal traction devices but no traction applied.  The right operative hip was assessed radiographically as well as the pelvis.  The right hip was then prepped and draped with DuraPrep and sterile drapes.  A timeout was called and she was identified as the correct patient the correct right hip.  An incision was then made just inferior and posterior to the ASIS and carried slightly obliquely down the leg.  Dissection was carried down to the tensor fascia lata muscle and the tensor fascia was divided longitudinally to proceed with a direct anterior pressure the hip.  Circumflex vessels were identified and cauterized.  The hip capsule was then divided and opened up in L-type format finding a moderate joint effusion.  Cobra retractors were placed around the medial and lateral femoral neck and a femoral neck cut was made with an oscillating saw just proximal to the lesser trochanter.  This Was completed with an osteotome.  A corkscrew guide was placed in the femoral head and the femoral head was removed its entirety and there was significant cartilage loss with the femoral head.  A bent Hohmann was then placed over the medial acetabular rim and remnants of the acetabular labrum and other debris removed.  Reaming was then initiated from a size 43 reamer and stepwise increments going up to a size 51 reamer with all reamers placed under direct visualization and the last reamer was also placed under direct fluoroscopy in order to obtain the depth of reaming, the inclination and the anteversion.  The real DePuy sector GRIPTION acetabular opponent size 52 was then placed without difficulty followed by  a 36+4 polythene liner.  Attention was then turned to the femur.  With the right leg externally rotated to 120 degrees, extended and adducted, a Mueller retractor was placed medially by the medial calcar and a Hohmann retractor was placed behind the greater trochanter.  The lateral joint capsule was released  and a box cutting osteotome was used to enter the femoral canal.  Broaching was then initiated using the Actis broaching system from a size 0 going to a size 4.  With a size 4 in place we trialed a standard offset femoral neck and a 36+1.5 trial hip ball.  The leg was brought over and up and with traction and internal rotation was used in the pelvis.  We assessed the right hip radiographically and clinically and we felt like we needed just a little bit more leg length.  The hip was dislocated and remove the trial components.  We placed the real Actis femoral component size 4 with standard offset and the real 36+5 metal head ball and again reduces in the acetabulum and we assessed it radiographically and mechanically and we are pleased with stability, offset, range of motion and leg length.  The soft tissue was then irrigated with normal saline solution.  The joint capsule was closed with interrupted #1 Ethibond suture followed by #1 Vicryl to close the tensor fascia.  0 Vicryl was used to close the deep tissue and 2-0 Vicryl was used to close the subcutaneous tissue.  The skin was closed with staples.  An Aquacel dressing was applied.  The patient was taken off of the Hana table and taken to the recovery room.  Rexene Edison, PA-C did assist during the entire case from beginning to end and his assistance was crucial and medically necessary for soft tissue management and retraction, helping guide implant placement and a layered closure of the wound.

## 2023-02-25 NOTE — Plan of Care (Signed)

## 2023-02-25 NOTE — Anesthesia Postprocedure Evaluation (Signed)
Anesthesia Post Note  Patient: Aimee Lawrence  Procedure(s) Performed: RIGHT TOTAL HIP ARTHROPLASTY ANTERIOR APPROACH (Right: Hip)     Patient location during evaluation: PACU Anesthesia Type: Spinal Level of consciousness: oriented and awake and alert Pain management: pain level controlled Vital Signs Assessment: post-procedure vital signs reviewed and stable Respiratory status: spontaneous breathing, respiratory function stable and patient connected to nasal cannula oxygen Cardiovascular status: blood pressure returned to baseline and stable Postop Assessment: no headache, no backache and no apparent nausea or vomiting Anesthetic complications: no  No notable events documented.  Last Vitals:  Vitals:   02/25/23 1015 02/25/23 1042  BP: (!) 141/85 131/75  Pulse: 65 63  Resp: 12 20  Temp:  (!) 36.2 C  SpO2: 100% 100%    Last Pain:  Vitals:   02/25/23 1042  TempSrc: Oral  PainSc: 0-No pain                 Marcellus Pulliam L Edina Winningham

## 2023-02-25 NOTE — Interval H&P Note (Signed)
History and Physical Interval Note: The patient understands that she is here today for a right total hip arthroplasty to treat her right hip pain.  On radiograph the left hip is much more severe in terms of the arthritis and originally her pain was severe with left hip but over the last several months she is experienced more right hip pain so she would rather have her right hip replaced today and at some other point replaced her left hip.  She says again her right hip has been so debilitating in terms of pain.  We have had several conversations on the phone in the office about this.  Again the plan today is to proceed with a right total hip replacement.  The risks and benefits of surgery been discussed in detail and informed consent has been obtained.  The right operative hip has been marked.  02/25/2023 7:03 AM  Aimee Lawrence  has presented today for surgery, with the diagnosis of osteoarthritis right hip.  The various methods of treatment have been discussed with the patient and family. After consideration of risks, benefits and other options for treatment, the patient has consented to  Procedure(s): RIGHT TOTAL HIP ARTHROPLASTY ANTERIOR APPROACH (Right) as a surgical intervention.  The patient's history has been reviewed, patient examined, no change in status, stable for surgery.  I have reviewed the patient's chart and labs.  Questions were answered to the patient's satisfaction.     Kathryne Hitch

## 2023-02-26 DIAGNOSIS — Z79899 Other long term (current) drug therapy: Secondary | ICD-10-CM | POA: Diagnosis not present

## 2023-02-26 DIAGNOSIS — I10 Essential (primary) hypertension: Secondary | ICD-10-CM | POA: Diagnosis not present

## 2023-02-26 DIAGNOSIS — M1611 Unilateral primary osteoarthritis, right hip: Secondary | ICD-10-CM | POA: Diagnosis not present

## 2023-02-26 DIAGNOSIS — Z96651 Presence of right artificial knee joint: Secondary | ICD-10-CM | POA: Diagnosis not present

## 2023-02-26 DIAGNOSIS — J45909 Unspecified asthma, uncomplicated: Secondary | ICD-10-CM | POA: Diagnosis not present

## 2023-02-26 LAB — BASIC METABOLIC PANEL
Anion gap: 8 (ref 5–15)
BUN: 11 mg/dL (ref 8–23)
CO2: 28 mmol/L (ref 22–32)
Calcium: 8.7 mg/dL — ABNORMAL LOW (ref 8.9–10.3)
Chloride: 104 mmol/L (ref 98–111)
Creatinine, Ser: 0.69 mg/dL (ref 0.44–1.00)
GFR, Estimated: >60 mL/min (ref >60)
Glucose, Bld: 154 mg/dL — ABNORMAL HIGH (ref 70–99)
Potassium: 3.6 mmol/L (ref 3.5–5.1)
Sodium: 140 mmol/L (ref 135–145)

## 2023-02-26 LAB — CBC
HCT: 32.6 % — ABNORMAL LOW (ref 36.0–46.0)
Hemoglobin: 10.3 g/dL — ABNORMAL LOW (ref 12.0–15.0)
MCH: 29.5 pg (ref 26.0–34.0)
MCHC: 31.6 g/dL (ref 30.0–36.0)
MCV: 93.4 fL (ref 80.0–100.0)
Platelets: 221 10*3/uL (ref 150–400)
RBC: 3.49 MIL/uL — ABNORMAL LOW (ref 3.87–5.11)
RDW: 13.7 % (ref 11.5–15.5)
WBC: 12.7 10*3/uL — ABNORMAL HIGH (ref 4.0–10.5)
nRBC: 0 % (ref 0.0–0.2)

## 2023-02-26 MED ORDER — METHOCARBAMOL 500 MG PO TABS: 500.0000 mg | ORAL_TABLET | Freq: Four times a day (QID) | ORAL | 0 refills | Status: DC | PRN

## 2023-02-26 MED ORDER — ASPIRIN 81 MG PO TBEC: 81.0000 mg | DELAYED_RELEASE_TABLET | Freq: Two times a day (BID) | ORAL | 0 refills | Status: DC

## 2023-02-26 MED ORDER — HYDROCODONE-ACETAMINOPHEN 5-325 MG PO TABS: 1.0000 | ORAL_TABLET | Freq: Four times a day (QID) | ORAL | 0 refills | Status: DC | PRN

## 2023-02-26 NOTE — Progress Notes (Signed)
Physical Therapy Treatment Patient Details Name: Aimee Lawrence MRN: [id number] DOB: [birth year] Today's Date: 02/26/2023   History of Present Illness Pt s/p R THR and with hx of R TKR and osteopenia    PT Comments  Pt in good spirits and progressing well with mobility.  Pt up to ambulate in hall, to bathroom for toileting and hygiene at sink, and HEP initiated.  Pt hopeful for dc home this pm.    If plan is discharge home, recommend the following: A little help with walking and/or transfers;A little help with bathing/dressing/bathroom;Assistance with cooking/housework;Assist for transportation;Help with stairs or ramp for entrance   Can travel by private vehicle        Equipment Recommendations  Rolling walker (2 wheels)    Recommendations for Other Services       Precautions / Restrictions Precautions Precautions: Fall Restrictions Weight Bearing Restrictions: No RLE Weight Bearing: Weight bearing as tolerated     Mobility  Bed Mobility               General bed mobility comments: Pt up in chair and requests back to same    Transfers Overall transfer level: Needs assistance Equipment used: Rolling walker (2 wheels) Transfers: Sit to/from Stand Sit to Stand: Min assist, Contact guard assist           General transfer comment: cues for LE management and use of UEs to self assist    Ambulation/Gait Ambulation/Gait assistance: Contact guard assist Gait Distance (Feet): 144 Feet (and 15' into bathroom) Assistive device: Rolling walker (2 wheels) Gait Pattern/deviations: Decreased step length - right, Decreased step length - left, Shuffle, Trunk flexed, Step-to pattern, Step-through pattern Gait velocity: decr     General Gait Details: cues for sequence, posture and position from [employer]             Wheelchair Mobility     Tilt Bed    Modified Rankin (Stroke Patients Only)       Balance Overall balance assessment: Needs  assistance Sitting-balance support: No upper extremity supported, Feet supported Sitting balance-Leahy Scale: Good     Standing balance support: Single extremity supported Standing balance-Leahy Scale: Poor                              Cognition Arousal: Alert Behavior During Therapy: WFL for tasks assessed/performed Overall Cognitive Status: Within Functional Limits for tasks assessed                                          Exercises Total Joint Exercises Ankle Circles/Pumps: AROM, Both, 15 reps, Supine Quad Sets: AROM, Both, 10 reps, Supine Heel Slides: AAROM, Right, 20 reps, Supine Hip ABduction/ADduction: AAROM, Right, 15 reps, Supine Long Arc Quad: AAROM, Right, 10 reps, Seated    General Comments        Pertinent Vitals/Pain Pain Assessment Pain Assessment: 0-10 Pain Score: 5  Pain Location: R hip Pain Descriptors / Indicators: Aching, Sore Pain Intervention(s): Limited activity within patient's tolerance, Monitored during session, Premedicated before session, Ice applied    Home Living                          Prior Function            PT Goals (current goals  can now be found in the care plan section) Acute Rehab PT Goals Patient Stated Goal: Regain IND PT Goal Formulation: With patient Time For Goal Achievement: 03/04/23 Potential to Achieve Goals: Good Progress towards PT goals: Progressing toward goals    Frequency    7X/week      PT Plan      Co-evaluation              AM-PAC PT "6 Clicks" Mobility   Outcome Measure  Help needed turning from your back to your side while in a flat bed without using bedrails?: A Lot Help needed moving from lying on your back to sitting on the side of a flat bed without using bedrails?: A Lot Help needed moving to and from a bed to a chair (including a wheelchair)?: A Little Help needed standing up from a chair using your arms (e.g., wheelchair or bedside  chair)?: A Little Help needed to walk in hospital room?: A Little Help needed climbing 3-5 steps with a railing? : A Little 6 Click Score: 16    End of Session Equipment Utilized During Treatment: Gait belt Activity Tolerance: Patient tolerated treatment well Patient left: in chair;with call bell/phone within reach;with chair alarm set Nurse Communication: Mobility status PT Visit Diagnosis: Difficulty in walking, not elsewhere classified (R26.2)     Time: 0981-1914 PT Time Calculation (min) (ACUTE ONLY): 33 min  Charges:    $Gait Training: 8-22 mins $Therapeutic Exercise: 8-22 mins PT General Charges $$ ACUTE PT VISIT: 1 Visit                     Mauro Kaufmann PT Acute Rehabilitation Services Pager [phone] Office [phone]    Aimee Lawrence 02/26/2023, 3:01 PM

## 2023-02-26 NOTE — Progress Notes (Signed)
Physical Therapy Treatment Patient Details Name: Aimee Lawrence MRN: [id number] DOB: 02-Aug-1945 Today's Date: 02/26/2023   History of Present Illness Pt s/p R THR and with hx of R TKR and osteopenia    PT Comments  Pt in good spirits and continues to progress well with mobility.  Pt up to ambulate in hall, negotiated stairs, reviewed car transfers and reviewed written HEP.  Pt eager for dc home this date.    If plan is discharge home, recommend the following: A little help with walking and/or transfers;A little help with bathing/dressing/bathroom;Assistance with cooking/housework;Assist for transportation;Help with stairs or ramp for entrance   Can travel by private vehicle        Equipment Recommendations  Rolling walker (2 wheels)    Recommendations for Other Services       Precautions / Restrictions Precautions Precautions: Fall Restrictions Weight Bearing Restrictions: No RLE Weight Bearing: Weight bearing as tolerated     Mobility  Bed Mobility               General bed mobility comments: Pt up in chair and requests back to same    Transfers Overall transfer level: Needs assistance Equipment used: Rolling walker (2 wheels) Transfers: Sit to/from Stand Sit to Stand: Contact guard assist, Supervision           General transfer comment: cues for LE management and use of UEs to self assist    Ambulation/Gait Ambulation/Gait assistance: Contact guard assist, Supervision Gait Distance (Feet): 111 Feet Assistive device: Rolling walker (2 wheels) Gait Pattern/deviations: Decreased step length - right, Decreased step length - left, Shuffle, Trunk flexed, Step-to pattern, Step-through pattern Gait velocity: decr     General Gait Details: cues for sequence, posture and position from RW   Stairs Stairs: Yes Stairs assistance: Min assist Stair Management: One rail Left, Step to pattern, Forwards, With cane Number of Stairs: 5 General stair comments:  cues for sequence and foot/cane placement   Wheelchair Mobility     Tilt Bed    Modified Rankin (Stroke Patients Only)       Balance Overall balance assessment: Needs assistance Sitting-balance support: No upper extremity supported, Feet supported Sitting balance-Leahy Scale: Good     Standing balance support: Single extremity supported Standing balance-Leahy Scale: Poor                              Cognition Arousal: Alert Behavior During Therapy: WFL for tasks assessed/performed Overall Cognitive Status: Within Functional Limits for tasks assessed                                          Exercises Total Joint Exercises Ankle Circles/Pumps: AROM, Both, 15 reps, Supine Quad Sets: AROM, Both, 10 reps, Supine Heel Slides: AAROM, Right, 20 reps, Supine Hip ABduction/ADduction: AAROM, Right, 15 reps, Supine Long Arc Quad: AAROM, Right, 10 reps, Seated    General Comments        Pertinent Vitals/Pain Pain Assessment Pain Assessment: 0-10 Pain Score: 5  Pain Location: R hip Pain Descriptors / Indicators: Aching, Sore Pain Intervention(s): Limited activity within patient's tolerance, Monitored during session, Premedicated before session, Ice applied    Home Living                          Prior  Function            PT Goals (current goals can now be found in the care plan section) Acute Rehab PT Goals Patient Stated Goal: Regain IND PT Goal Formulation: With patient Time For Goal Achievement: 03/04/23 Potential to Achieve Goals: Good Progress towards PT goals: Progressing toward goals    Frequency    7X/week      PT Plan      Co-evaluation              AM-PAC PT "6 Clicks" Mobility   Outcome Measure  Help needed turning from your back to your side while in a flat bed without using bedrails?: A Lot Help needed moving from lying on your back to sitting on the side of a flat bed without using bedrails?:  A Lot Help needed moving to and from a bed to a chair (including a wheelchair)?: A Little Help needed standing up from a chair using your arms (e.g., wheelchair or bedside chair)?: A Little Help needed to walk in hospital room?: A Little Help needed climbing 3-5 steps with a railing? : A Little 6 Click Score: 16    End of Session Equipment Utilized During Treatment: Gait belt Activity Tolerance: Patient tolerated treatment well Patient left: in chair;with call bell/phone within reach;with chair alarm set;with family/visitor present Nurse Communication: Mobility status PT Visit Diagnosis: Difficulty in walking, not elsewhere classified (R26.2)     Time: 0454-0981 PT Time Calculation (min) (ACUTE ONLY): 33 min  Charges:    $Gait Training: 8-22 mins $Therapeutic Exercise: 8-22 mins $Therapeutic Activity: 8-22 mins PT General Charges $$ ACUTE PT VISIT: 1 Visit                     Mauro Kaufmann PT Acute Rehabilitation Services Pager [phone] Office [phone]    Reese Senk 02/26/2023, 3:07 PM

## 2023-02-26 NOTE — Progress Notes (Signed)
Subjective: 1 Day Post-Op Procedure(s) (LRB): RIGHT TOTAL HIP ARTHROPLASTY ANTERIOR APPROACH (Right) Patient reports pain as moderate.  She has been up with physical therapy and has done well.  She did let me know that she does not have any swallowing difficulties with a baby aspirin/low-dose aspirin.  I have her on Eliquis now but I am going to stop that.  We will discharge her on an 81 mg aspirin.  Objective: Vital signs in last 24 hours: Temp:  [98.2 F (36.8 C)-98.5 F (36.9 C)] 98.2 F (36.8 C) (08/17 1103) Pulse Rate:  [69-94] 90 (08/17 1103) Resp:  [14-18] 16 (08/17 0522) BP: (109-134)/(60-74) 115/60 (08/17 1103) SpO2:  [93 %-100 %] 96 % (08/17 1103)  Intake/Output from previous day: 08/16 0701 - 08/17 0700 In: 4168.4 [P.O.:597; I.V.:2566.4; IV Piggyback:405] Out: 3750 [Urine:3350; Blood:400] Intake/Output this shift: No intake/output data recorded.  Recent Labs    02/26/23 0335  HGB 10.3*   Recent Labs    02/26/23 0335  WBC 12.7*  RBC 3.49*  HCT 32.6*  PLT 221   Recent Labs    02/26/23 0335  NA 140  K 3.6  CL 104  CO2 28  BUN 11  CREATININE 0.69  GLUCOSE 154*  CALCIUM 8.7*   No results for input(s): "LABPT", "INR" in the last 72 hours.  Sensation intact distally Intact pulses distally Dorsiflexion/Plantar flexion intact Incision: dressing C/D/I   Assessment/Plan: 1 Day Post-Op Procedure(s) (LRB): RIGHT TOTAL HIP ARTHROPLASTY ANTERIOR APPROACH (Right) Up with therapy Discharge home with home health      Kathryne Hitch 02/26/2023, 11:20 AM

## 2023-02-26 NOTE — Discharge Summary (Signed)
Patient ID: Aimee Lawrence MRN: [id number] DOB/AGE: 1946/04/28 77 y.o.  Admit date: 02/25/2023 Discharge date: 02/26/2023  Admission Diagnoses:  Principal Problem:   Unilateral primary osteoarthritis, left hip Active Problems:   Status post total replacement of right hip   Discharge Diagnoses:  Same  Past Medical History:  Diagnosis Date   Allergy    takes Doxycycline daily and uses Flonase daily   Arthritis    Asthma    Albuterol inhaler as needed.   Dyspnea    rarely and sitting   GERD (gastroesophageal reflux disease)    not on any meds   History of bronchitis    History of shingles    Hx of colonic polyps    benign   Joint pain    Joint swelling    Osteopenia    takes Vit D3 and Calcium daily   Primary localized osteoarthritis of right knee     Surgeries: Procedure(s): RIGHT TOTAL HIP ARTHROPLASTY ANTERIOR APPROACH on 02/25/2023   Consultants:   Discharged Condition: Improved  Hospital Course: Aimee Lawrence is an 77 y.o. female who was admitted 02/25/2023 for operative treatment ofUnilateral primary osteoarthritis, left hip. Patient has severe unremitting pain that affects sleep, daily activities, and work/hobbies. After pre-op clearance the patient was taken to the operating room on 02/25/2023 and underwent  Procedure(s): RIGHT TOTAL HIP ARTHROPLASTY ANTERIOR APPROACH.    Patient was given perioperative antibiotics:  Anti-infectives (From admission, onward)    Start     Dose/Rate Route Frequency Ordered Stop   02/25/23 1330  ceFAZolin (ANCEF) IVPB 1 g/50 mL premix        1 g 100 mL/hr over 30 Minutes Intravenous Every 6 hours 02/25/23 1042 02/25/23 2004   02/25/23 0650  ceFAZolin (ANCEF) IVPB 2g/100 mL premix        2 g 200 mL/hr over 30 Minutes Intravenous  Once 02/25/23 0643 02/25/23 0758   02/25/23 0645  ceFAZolin (ANCEF) 2-4 GM/100ML-% IVPB       Note to Pharmacy: Ramond Craver R: cabinet override      02/25/23 0645 02/25/23 0729         Patient was given sequential compression devices, early ambulation, and chemoprophylaxis to prevent DVT.  Patient benefited maximally from hospital stay and there were no complications.    Recent vital signs: Patient Vitals for the past 24 hrs:  BP Temp Temp src Pulse Resp SpO2  02/26/23 1103 115/60 98.2 F (36.8 C) Oral 90 -- 96 %  02/26/23 0522 109/66 98.5 F (36.9 C) -- 69 16 94 %  02/26/23 0218 115/63 98.4 F (36.9 C) -- 73 16 93 %  02/25/23 2223 128/74 98.5 F (36.9 C) -- 94 18 98 %  02/25/23 1808 134/68 98.4 F (36.9 C) -- 81 17 97 %  02/25/23 1247 130/73 -- -- 77 14 100 %     Recent laboratory studies:  Recent Labs    02/26/23 0335  WBC 12.7*  HGB 10.3*  HCT 32.6*  PLT 221  NA 140  K 3.6  CL 104  CO2 28  BUN 11  CREATININE 0.69  GLUCOSE 154*  CALCIUM 8.7*     Discharge Medications:   Allergies as of 02/26/2023       Reactions   Aleve [naproxen Sodium] Shortness Of Breath   Aspirin Shortness Of Breath   Trouble breathing   Ibuprofen Shortness Of Breath   Short of breath   Ampicillin Rash   Has patient had a PCN  reaction causing immediate rash, facial/tongue/throat swelling, SOB or lightheadedness with hypotension:   # # NO # #  Has patient had a PCN reaction that required hospitalization    # # NO # #  Has patient had a PCN reaction occurring within the last 10 years:    # # NO # #  If all of the above answers are "NO", then may proceed with Cephalosporin use.   Erythromycin Nausea And Vomiting        Medication List     TAKE these medications    albuterol 108 (90 Base) MCG/ACT inhaler Commonly known as: VENTOLIN HFA Inhale 1-2 puffs into the lungs every 6 (six) hours as needed for wheezing or shortness of breath.   aspirin EC 81 MG tablet Take 1 tablet (81 mg total) by mouth 2 (two) times daily. Swallow whole.   atorvastatin 10 MG tablet Commonly known as: LIPITOR Take 1 tablet (10 mg total) by mouth daily.   CALCIUM 500/D PO Take 2  tablets by mouth daily. 500 mg-200 mg   EPINEPHrine 0.3 mg/0.3 mL Soaj injection Commonly known as: EPI-PEN Inject 0.3 mg into the muscle as needed for anaphylaxis.   fluticasone 50 MCG/ACT nasal spray Commonly known as: FLONASE Place 1 spray into both nostrils daily.   HYDROcodone-acetaminophen 5-325 MG tablet Commonly known as: NORCO/VICODIN Take 1-2 tablets by mouth every 6 (six) hours as needed for moderate pain (pain score 4-6).   LORazepam 0.5 MG tablet Commonly known as: ATIVAN Take 1 tablet (0.5 mg total) by mouth at bedtime.   MAGNESIUM CITRATE PO Take 250 mg by mouth daily.   methocarbamol 500 MG tablet Commonly known as: ROBAXIN Take 1 tablet (500 mg total) by mouth every 6 (six) hours as needed for muscle spasms.   montelukast 10 MG tablet Commonly known as: SINGULAIR Take 1 tablet by mouth once daily or as directed.   omeprazole 40 MG capsule Commonly known as: PRILOSEC TAKE 1 CAPSULE BY MOUTH TWICE A DAY WITH A MEAL What changed:  how much to take how to take this when to take this additional instructions   ondansetron 8 MG tablet Commonly known as: Zofran Take 1 tablet (8 mg total) by mouth every 8 (eight) hours as needed for nausea or vomiting.   traMADol 50 MG tablet Commonly known as: ULTRAM TAKE 2 TABLETS BY MOUTH EVERY 6 HOURS AS NEEDED What changed:  how much to take reasons to take this   Vitamin D3 50 MCG (2000 UT) capsule Take 4,000 Units by mouth daily.               Durable Medical Equipment  (From admission, onward)           Start     Ordered   02/26/23 1039  DME Walker rolling  Once       Comments: Youth walker  Question Answer Comment  Walker: With 5 Inch Wheels   Patient needs a walker to treat with the following condition Status post total replacement of right hip      02/26/23 1038   02/25/23 1043  DME 3 n 1  Once        02/25/23 1042            Diagnostic Studies: DG Pelvis Portable  Result Date:  02/25/2023 CLINICAL DATA:  Status post right total hip arthroplasty. EXAM: PORTABLE PELVIS 1-2 VIEWS COMPARISON:  11/10/2022 FINDINGS: There are postsurgical changes from a right total hip arthroplasty. The  hardware components are in anatomic alignment. No periprosthetic fracture or dislocation. Soft tissue gas and skin staples overlie the right hip. Advanced degenerative changes are noted involving the left hip. IMPRESSION: 1. Status post right total hip arthroplasty. 2. Advanced left hip osteoarthritis. Electronically Signed   By: Signa Kell M.D.   On: 02/25/2023 13:04   DG HIP UNILAT WITH PELVIS 1V RIGHT  Result Date: 02/25/2023 CLINICAL DATA:  Status post right hip arthroplasty. EXAM: DG HIP (WITH OR WITHOUT PELVIS) 1V RIGHT COMPARISON:  11/10/2022 FINDINGS: Three images obtained via portable C-arm radiography in the operating room show changes of right total hip arthroplasty. The hardware components are in anatomic alignment. No signs of periprosthetic fracture or dislocation. Severe degenerative changes are noted in the contralateral hip. IMPRESSION: Status post right total hip arthroplasty. Electronically Signed   By: Signa Kell M.D.   On: 02/25/2023 12:21   DG C-Arm 1-60 Min-No Report  Result Date: 02/25/2023 Fluoroscopy was utilized by the requesting physician.  No radiographic interpretation.    Disposition: Discharge disposition: 01-Home or Self Care          Follow-up Information     [facility] Follow up.   Why: [facility] will provide PT in the home after discharge.        Kathryne Hitch, MD Follow up in 2 week(s).   Specialty: Orthopedic Surgery Contact information: [address] [place] [address] [phone]                  Signed: Kathryne Hitch 02/26/2023, 11:24 AM

## 2023-02-26 NOTE — Progress Notes (Signed)
Patient's nurse reached out to this CSW about walker. Nurse reported that PT stated that was not the correct walker and the patient would need a youth walker. This CSW has ordered a Youth walker with [employer] from [place]. AT this time the walker will be delivered to the patient's room. At this time there are no further TOC needs.

## 2023-02-26 NOTE — Plan of Care (Signed)
Problem: Education: Goal: Knowledge of the prescribed therapeutic regimen will improve Outcome: Progressing   Problem: Activity: Goal: Ability to avoid complications of mobility impairment will improve Outcome: Progressing   Problem: Clinical Measurements: Goal: Postoperative complications will be avoided or minimized Outcome: Progressing   Problem: Pain Management: Goal: Pain level will decrease with appropriate interventions Outcome: Progressing   Haydee Salter, RN 02/26/23 9:14 AM

## 2023-02-26 NOTE — Plan of Care (Signed)
Patient discharging home via private vehicle with friend Lowella Fairy after receiving DME (rolling walker). Haydee Salter, RN 02/26/23 3:21 PM

## 2023-02-27 DIAGNOSIS — I700 Atherosclerosis of aorta: Secondary | ICD-10-CM | POA: Diagnosis not present

## 2023-02-27 DIAGNOSIS — I10 Essential (primary) hypertension: Secondary | ICD-10-CM | POA: Diagnosis not present

## 2023-02-27 DIAGNOSIS — J45909 Unspecified asthma, uncomplicated: Secondary | ICD-10-CM | POA: Diagnosis not present

## 2023-02-27 DIAGNOSIS — M1612 Unilateral primary osteoarthritis, left hip: Secondary | ICD-10-CM | POA: Diagnosis not present

## 2023-02-27 DIAGNOSIS — F32A Depression, unspecified: Secondary | ICD-10-CM | POA: Diagnosis not present

## 2023-02-27 DIAGNOSIS — Z471 Aftercare following joint replacement surgery: Secondary | ICD-10-CM | POA: Diagnosis not present

## 2023-02-27 DIAGNOSIS — M8580 Other specified disorders of bone density and structure, unspecified site: Secondary | ICD-10-CM | POA: Diagnosis not present

## 2023-02-27 DIAGNOSIS — G4700 Insomnia, unspecified: Secondary | ICD-10-CM | POA: Diagnosis not present

## 2023-02-27 DIAGNOSIS — K219 Gastro-esophageal reflux disease without esophagitis: Secondary | ICD-10-CM | POA: Diagnosis not present

## 2023-02-28 ENCOUNTER — Encounter (HOSPITAL_COMMUNITY): Payer: Self-pay | Admitting: Orthopaedic Surgery

## 2023-02-28 ENCOUNTER — Telehealth: Payer: Self-pay | Admitting: *Deleted

## 2023-02-28 NOTE — Telephone Encounter (Signed)
Attempted Ortho bundle D/C call to patient; no answer and left VM.

## 2023-03-02 DIAGNOSIS — I700 Atherosclerosis of aorta: Secondary | ICD-10-CM | POA: Diagnosis not present

## 2023-03-02 DIAGNOSIS — M1612 Unilateral primary osteoarthritis, left hip: Secondary | ICD-10-CM | POA: Diagnosis not present

## 2023-03-02 DIAGNOSIS — G4700 Insomnia, unspecified: Secondary | ICD-10-CM | POA: Diagnosis not present

## 2023-03-02 DIAGNOSIS — K219 Gastro-esophageal reflux disease without esophagitis: Secondary | ICD-10-CM | POA: Diagnosis not present

## 2023-03-02 DIAGNOSIS — I10 Essential (primary) hypertension: Secondary | ICD-10-CM | POA: Diagnosis not present

## 2023-03-02 DIAGNOSIS — J45909 Unspecified asthma, uncomplicated: Secondary | ICD-10-CM | POA: Diagnosis not present

## 2023-03-02 DIAGNOSIS — Z471 Aftercare following joint replacement surgery: Secondary | ICD-10-CM | POA: Diagnosis not present

## 2023-03-02 DIAGNOSIS — M8580 Other specified disorders of bone density and structure, unspecified site: Secondary | ICD-10-CM | POA: Diagnosis not present

## 2023-03-02 DIAGNOSIS — F32A Depression, unspecified: Secondary | ICD-10-CM | POA: Diagnosis not present

## 2023-03-07 ENCOUNTER — Other Ambulatory Visit: Payer: Self-pay | Admitting: Orthopaedic Surgery

## 2023-03-07 DIAGNOSIS — Z471 Aftercare following joint replacement surgery: Secondary | ICD-10-CM | POA: Diagnosis not present

## 2023-03-07 DIAGNOSIS — I700 Atherosclerosis of aorta: Secondary | ICD-10-CM | POA: Diagnosis not present

## 2023-03-07 DIAGNOSIS — F32A Depression, unspecified: Secondary | ICD-10-CM | POA: Diagnosis not present

## 2023-03-07 DIAGNOSIS — G4700 Insomnia, unspecified: Secondary | ICD-10-CM | POA: Diagnosis not present

## 2023-03-07 DIAGNOSIS — M8580 Other specified disorders of bone density and structure, unspecified site: Secondary | ICD-10-CM | POA: Diagnosis not present

## 2023-03-07 DIAGNOSIS — J45909 Unspecified asthma, uncomplicated: Secondary | ICD-10-CM | POA: Diagnosis not present

## 2023-03-07 DIAGNOSIS — M1612 Unilateral primary osteoarthritis, left hip: Secondary | ICD-10-CM | POA: Diagnosis not present

## 2023-03-07 DIAGNOSIS — K219 Gastro-esophageal reflux disease without esophagitis: Secondary | ICD-10-CM | POA: Diagnosis not present

## 2023-03-07 DIAGNOSIS — I10 Essential (primary) hypertension: Secondary | ICD-10-CM | POA: Diagnosis not present

## 2023-03-08 DIAGNOSIS — J3089 Other allergic rhinitis: Secondary | ICD-10-CM | POA: Diagnosis not present

## 2023-03-08 DIAGNOSIS — J3081 Allergic rhinitis due to animal (cat) (dog) hair and dander: Secondary | ICD-10-CM | POA: Diagnosis not present

## 2023-03-08 DIAGNOSIS — J301 Allergic rhinitis due to pollen: Secondary | ICD-10-CM | POA: Diagnosis not present

## 2023-03-09 DIAGNOSIS — K219 Gastro-esophageal reflux disease without esophagitis: Secondary | ICD-10-CM | POA: Diagnosis not present

## 2023-03-09 DIAGNOSIS — M8580 Other specified disorders of bone density and structure, unspecified site: Secondary | ICD-10-CM | POA: Diagnosis not present

## 2023-03-09 DIAGNOSIS — J45909 Unspecified asthma, uncomplicated: Secondary | ICD-10-CM | POA: Diagnosis not present

## 2023-03-09 DIAGNOSIS — M1612 Unilateral primary osteoarthritis, left hip: Secondary | ICD-10-CM | POA: Diagnosis not present

## 2023-03-09 DIAGNOSIS — I700 Atherosclerosis of aorta: Secondary | ICD-10-CM | POA: Diagnosis not present

## 2023-03-09 DIAGNOSIS — F32A Depression, unspecified: Secondary | ICD-10-CM | POA: Diagnosis not present

## 2023-03-09 DIAGNOSIS — G4700 Insomnia, unspecified: Secondary | ICD-10-CM | POA: Diagnosis not present

## 2023-03-09 DIAGNOSIS — I10 Essential (primary) hypertension: Secondary | ICD-10-CM | POA: Diagnosis not present

## 2023-03-09 DIAGNOSIS — Z471 Aftercare following joint replacement surgery: Secondary | ICD-10-CM | POA: Diagnosis not present

## 2023-03-10 ENCOUNTER — Telehealth: Payer: Self-pay | Admitting: *Deleted

## 2023-03-10 ENCOUNTER — Other Ambulatory Visit: Payer: Self-pay | Admitting: Orthopaedic Surgery

## 2023-03-10 ENCOUNTER — Ambulatory Visit (INDEPENDENT_AMBULATORY_CARE_PROVIDER_SITE_OTHER): Payer: Medicare HMO | Admitting: Orthopaedic Surgery

## 2023-03-10 DIAGNOSIS — J45909 Unspecified asthma, uncomplicated: Secondary | ICD-10-CM | POA: Diagnosis not present

## 2023-03-10 DIAGNOSIS — K219 Gastro-esophageal reflux disease without esophagitis: Secondary | ICD-10-CM | POA: Diagnosis not present

## 2023-03-10 DIAGNOSIS — I10 Essential (primary) hypertension: Secondary | ICD-10-CM | POA: Diagnosis not present

## 2023-03-10 DIAGNOSIS — G4700 Insomnia, unspecified: Secondary | ICD-10-CM | POA: Diagnosis not present

## 2023-03-10 DIAGNOSIS — M1612 Unilateral primary osteoarthritis, left hip: Secondary | ICD-10-CM | POA: Diagnosis not present

## 2023-03-10 DIAGNOSIS — I700 Atherosclerosis of aorta: Secondary | ICD-10-CM | POA: Diagnosis not present

## 2023-03-10 DIAGNOSIS — Z96641 Presence of right artificial hip joint: Secondary | ICD-10-CM

## 2023-03-10 DIAGNOSIS — M8580 Other specified disorders of bone density and structure, unspecified site: Secondary | ICD-10-CM | POA: Diagnosis not present

## 2023-03-10 DIAGNOSIS — F32A Depression, unspecified: Secondary | ICD-10-CM | POA: Diagnosis not present

## 2023-03-10 DIAGNOSIS — Z471 Aftercare following joint replacement surgery: Secondary | ICD-10-CM | POA: Diagnosis not present

## 2023-03-10 MED ORDER — HYDROCODONE-ACETAMINOPHEN 5-325 MG PO TABS: 1.0000 | ORAL_TABLET | Freq: Four times a day (QID) | ORAL | 0 refills | Status: DC | PRN

## 2023-03-10 NOTE — Progress Notes (Signed)
The patient is a 77 year old female who is here today for her first postoperative visit status post a right total hip arthroplasty.  She does have well-documented severe arthritis of her left hip but her right arthritic hip was much more painful so we chose to address that hip first.  She is already walking with just a cane.  She has been compliant with a baby aspirin twice a day and this just ran out.  She does need a refill on hydrocodone.  She is even ready for [place] to go ahead and schedule her left hip replacement.  She knows we have to wait at least 90 days.  She does have a history of a right knee replacement and wants to get back into therapy for that knee in terms of her home exercise program.  Right hip incision looks good.  The staples been removed and Steri-Strips applied.  There is no significant swelling or redness.  Her calf is soft.  She can stop her twice daily aspirin.  I will send in some hydrocodone.  We will see her back in 4 weeks to see how she is doing overall and we will go ahead and start working on finding a surgery date once it has been 90 days past her surgery on August 16.  This will put her next surgery on her left hip in November.

## 2023-03-10 NOTE — Telephone Encounter (Signed)
Ortho bundle 14 day in office appointment completed.

## 2023-03-11 ENCOUNTER — Other Ambulatory Visit: Payer: Self-pay | Admitting: Orthopaedic Surgery

## 2023-03-11 ENCOUNTER — Telehealth: Payer: Self-pay | Admitting: Orthopaedic Surgery

## 2023-03-11 MED ORDER — HYDROCODONE-ACETAMINOPHEN 5-325 MG PO TABS: 1.0000 | ORAL_TABLET | Freq: Four times a day (QID) | ORAL | 0 refills | Status: DC | PRN

## 2023-03-11 NOTE — Telephone Encounter (Signed)
Patient called. The [facility] teeter has hydrocodone. She misunderstood before. The pharmacy that the RX was sent to does not have any in stock. Would like rx sent to the [facility]

## 2023-03-11 NOTE — Telephone Encounter (Signed)
Lvm advising  

## 2023-03-11 NOTE — Telephone Encounter (Signed)
Patient called. Says she went to pick up the hydrocodone and pharmacy says the did not receive the RX. Her cb# [phone]

## 2023-03-11 NOTE — Telephone Encounter (Signed)
Double message

## 2023-03-17 ENCOUNTER — Encounter: Payer: Medicare HMO | Admitting: Orthopaedic Surgery

## 2023-03-25 ENCOUNTER — Telehealth: Payer: Self-pay | Admitting: Orthopaedic Surgery

## 2023-03-25 ENCOUNTER — Other Ambulatory Visit: Payer: Self-pay | Admitting: Orthopaedic Surgery

## 2023-03-25 MED ORDER — HYDROCODONE-ACETAMINOPHEN 5-325 MG PO TABS: 1.0000 | ORAL_TABLET | Freq: Four times a day (QID) | ORAL | 0 refills | Status: DC | PRN

## 2023-03-25 NOTE — Telephone Encounter (Signed)
Pt called requesting rx refill on HYDROcodone 5-325mg 

## 2023-04-06 ENCOUNTER — Other Ambulatory Visit (HOSPITAL_BASED_OUTPATIENT_CLINIC_OR_DEPARTMENT_OTHER): Payer: Self-pay | Admitting: Family Medicine

## 2023-04-07 ENCOUNTER — Telehealth: Payer: Self-pay | Admitting: *Deleted

## 2023-04-07 ENCOUNTER — Encounter: Payer: Self-pay | Admitting: Orthopaedic Surgery

## 2023-04-07 ENCOUNTER — Ambulatory Visit: Payer: Medicare HMO | Admitting: Orthopaedic Surgery

## 2023-04-07 DIAGNOSIS — Z96641 Presence of right artificial hip joint: Secondary | ICD-10-CM

## 2023-04-07 DIAGNOSIS — M1612 Unilateral primary osteoarthritis, left hip: Secondary | ICD-10-CM

## 2023-04-07 MED ORDER — HYDROCODONE-ACETAMINOPHEN 5-325 MG PO TABS: 1.0000 | ORAL_TABLET | Freq: Four times a day (QID) | ORAL | 0 refills | Status: DC | PRN

## 2023-04-07 NOTE — Telephone Encounter (Signed)
Ortho bundle in office visit completed.

## 2023-04-07 NOTE — Progress Notes (Signed)
The patient comes in today now 6-week status post a right total hip arthroplasty.  She has severe and severe arthritis of her left hip and we need to wait until it is just after November eighth 2 set her up for a left hip replacement.  This will be 90 days after her first joint.  She is 77 years old.  She has debilitating arthritis of both her hips and her right hip replacement has done very well for her.  She reports good range of motion and strength.  She does need a refill of pain medication she states.  Her right operative hip moves smoothly and fluidly.  The left hip is certainly painful throughout the arc of motion with known severe end-stage bone-on-bone arthritis of her left hip.  This is been well-documented to many visits in terms of the need for hip replacement surgery for both of her hips.  At this point we will work on getting her set up for a left total hip arthroplasty after November 8.  I will send in some more pain medication for her.  We will be in touch with the official scheduled

## 2023-04-22 ENCOUNTER — Telehealth: Payer: Self-pay

## 2023-04-22 NOTE — Telephone Encounter (Signed)
I called patient to discuss scheduling left THA.  Left voice mail for her to return my call.

## 2023-04-29 ENCOUNTER — Telehealth: Payer: Self-pay | Admitting: *Deleted

## 2023-04-29 ENCOUNTER — Other Ambulatory Visit: Payer: Self-pay | Admitting: Orthopaedic Surgery

## 2023-04-29 MED ORDER — HYDROCODONE-ACETAMINOPHEN 5-325 MG PO TABS: 1.0000 | ORAL_TABLET | Freq: Four times a day (QID) | ORAL | 0 refills | Status: DC | PRN

## 2023-04-29 NOTE — Telephone Encounter (Signed)
Patient called requesting a refill of pain medication. Thank you.

## 2023-05-19 ENCOUNTER — Other Ambulatory Visit: Payer: Self-pay

## 2023-05-21 ENCOUNTER — Other Ambulatory Visit: Payer: Self-pay | Admitting: Family Medicine

## 2023-05-30 NOTE — Patient Instructions (Signed)
SURGICAL WAITING ROOM VISITATION Patients having surgery or a procedure may have no more than 2 support people in the waiting area - these visitors may rotate.    Children under the age of 23 must have an adult with them who is not the patient.  If the patient needs to stay at the hospital during part of their recovery, the visitor guidelines for inpatient rooms apply. Pre-op nurse will coordinate an appropriate time for 1 support person to accompany patient in pre-op.  This support person may not rotate.    Please refer to the [facility] website for the visitor guidelines for Inpatients (after your surgery is over and you are in a regular room).       Your procedure is scheduled on: 06-03-23   Report to [facility] Main Entrance    Report to admitting at 5:15 AM   Call this number if you have problems the morning of surgery [phone]   Do not eat food :After Midnight.   After Midnight you may have the following liquids until 4:15 AM DAY OF SURGERY  Water Non-Citrus Juices (without pulp, NO RED-Apple, White grape, White cranberry) Black Coffee (NO MILK/CREAM OR CREAMERS, sugar ok)  Clear Tea (NO MILK/CREAM OR CREAMERS, sugar ok) regular and decaf                             Plain Jell-O (NO RED)                                           Fruit ices (not with fruit pulp, NO RED)                                     Popsicles (NO RED)                                                               Sports drinks like Gatorade (NO RED)                   The day of surgery:  Drink ONE (1) Pre-Surgery Clear Ensure or G2 by 4:15 AM the morning of surgery. Drink in one sitting. Do not sip.  This drink was given to you during your hospital  pre-op appointment visit. Nothing else to drink after completing the Pre-Surgery Clear Ensure or G2.          If you have questions, please contact your surgeon's office.   FOLLOW ANY ADDITIONAL PRE OP INSTRUCTIONS YOU RECEIVED FROM  YOUR SURGEON'S OFFICE!!!     Oral Hygiene is also important to reduce your risk of infection.                                    Remember - BRUSH YOUR TEETH THE MORNING OF SURGERY WITH YOUR REGULAR TOOTHPASTE   Do NOT smoke after Midnight   Take these medicines the morning of surgery with A SIP OF WATER:   Atorvastatin  Singulair  Omeprazole  Okay to use inhalers and nasal spray  If needed Hydrocodone orTramadol, Ondansetron  Stop all vitamins and herbal supplements 7 days before surgery  Bring CPAP mask and tubing day of surgery.                              You may not have any metal on your body including hair pins, jewelry, and body piercing             Do not wear make-up, lotions, powders, perfumes/, or deodorant  Do not wear nail polish including gel and S&S, artificial/acrylic nails, or any other type of covering on natural nails including finger and toenails. If you have artificial nails, gel coating, etc. that needs to be removed by a nail salon please have this removed prior to surgery or surgery may need to be canceled/ delayed if the surgeon/ anesthesia feels like they are unable to be safely monitored.   Do not shave  48 hours prior to surgery.       Do not bring valuables to the hospital. [facility] IS NOT RESPONSIBLE   FOR VALUABLES.   Contacts, dentures or bridgework may not be worn into surgery.   Bring small overnight bag day of surgery.   DO NOT BRING YOUR HOME MEDICATIONS TO THE HOSPITAL. PHARMACY WILL DISPENSE MEDICATIONS LISTED ON YOUR MEDICATION LIST TO YOU DURING YOUR ADMISSION IN THE HOSPITAL!   Special Instructions: Bring a copy of your healthcare power of attorney and living will documents the day of surgery if you haven't scanned them before.              Please read over the following fact sheets you were given: IF YOU HAVE QUESTIONS ABOUT YOUR PRE-OP INSTRUCTIONS PLEASE CALL [phone] Gwen  If you received a COVID test during your pre-op  visit  it is requested that you wear a mask when out in public, stay away from anyone that may not be feeling well and notify your surgeon if you develop symptoms. If you test positive for Covid or have been in contact with anyone that has tested positive in the last 10 days please notify you surgeon.    Pre-operative 5 CHG Bath Instructions   You can play a key role in reducing the risk of infection after surgery. Your skin needs to be as free of germs as possible. You can reduce the number of germs on your skin by washing with CHG (chlorhexidine gluconate) soap before surgery. CHG is an antiseptic soap that kills germs and continues to kill germs even after washing.   DO NOT use if you have an allergy to chlorhexidine/CHG or antibacterial soaps. If your skin becomes reddened or irritated, stop using the CHG and notify one of our RNs at [phone].   Please shower with the CHG soap starting 4 days before surgery using the following schedule:     Please keep in mind the following:  DO NOT shave, including legs and underarms, starting the day of your first shower.   You may shave your face at any point before/day of surgery.  Place clean sheets on your bed the day you start using CHG soap. Use a clean washcloth (not used since being washed) for each shower. DO NOT sleep with pets once you start using the CHG.   CHG Shower Instructions:  If you choose to wash your hair and private area, wash first  with your normal shampoo/soap.  After you use shampoo/soap, rinse your hair and body thoroughly to remove shampoo/soap residue.  Turn the water OFF and apply about 3 tablespoons (45 ml) of CHG soap to a CLEAN washcloth.  Apply CHG soap ONLY FROM YOUR NECK DOWN TO YOUR TOES (washing for 3-5 minutes)  DO NOT use CHG soap on face, private areas, open wounds, or sores.  Pay special attention to the area where your surgery is being performed.  If you are having back surgery, having someone wash your  back for you may be helpful. Wait 2 minutes after CHG soap is applied, then you may rinse off the CHG soap.  Pat dry with a clean towel  Put on clean clothes/pajamas   If you choose to wear lotion, please use ONLY the CHG-compatible lotions on the back of this paper.     Additional instructions for the day of surgery: DO NOT APPLY any lotions, deodorants, cologne, or perfumes.   Put on clean/comfortable clothes.  Brush your teeth.  Ask your nurse before applying any prescription medications to the skin.      CHG Compatible Lotions   Aveeno Moisturizing lotion  Cetaphil Moisturizing Cream  Cetaphil Moisturizing Lotion  Clairol Herbal Essence Moisturizing Lotion, Dry Skin  Clairol Herbal Essence Moisturizing Lotion, Extra Dry Skin  Clairol Herbal Essence Moisturizing Lotion, Normal Skin  Curel Age Defying Therapeutic Moisturizing Lotion with Alpha Hydroxy  Curel Extreme Care Body Lotion  Curel Soothing Hands Moisturizing Hand Lotion  Curel Therapeutic Moisturizing Cream, Fragrance-Free  Curel Therapeutic Moisturizing Lotion, Fragrance-Free  Curel Therapeutic Moisturizing Lotion, Original Formula  Eucerin Daily Replenishing Lotion  Eucerin Dry Skin Therapy Plus Alpha Hydroxy Crme  Eucerin Dry Skin Therapy Plus Alpha Hydroxy Lotion  Eucerin Original Crme  Eucerin Original Lotion  Eucerin Plus Crme Eucerin Plus Lotion  Eucerin TriLipid Replenishing Lotion  Keri Anti-Bacterial Hand Lotion  Keri Deep Conditioning Original Lotion Dry Skin Formula Softly Scented  Keri Deep Conditioning Original Lotion, Fragrance Free Sensitive Skin Formula  Keri Lotion Fast Absorbing Fragrance Free Sensitive Skin Formula  Keri Lotion Fast Absorbing Softly Scented Dry Skin Formula  Keri Original Lotion  Keri Skin Renewal Lotion Keri Silky Smooth Lotion  Keri Silky Smooth Sensitive Skin Lotion  Nivea Body Creamy Conditioning Oil  Nivea Body Extra Enriched Lotion  Nivea Body Original Lotion   Nivea Body Sheer Moisturizing Lotion Nivea Crme  Nivea Skin Firming Lotion  NutraDerm 30 Skin Lotion  NutraDerm Skin Lotion  NutraDerm Therapeutic Skin Cream  NutraDerm Therapeutic Skin Lotion  ProShield Protective Hand Cream  Provon moisturizing lotion   PATIENT SIGNATURE_________________________________  NURSE SIGNATURE__________________________________  ________________________________________________________________________    Rogelia Mire  An incentive spirometer is a tool that can help keep your lungs clear and active. This tool measures how well you are filling your lungs with each breath. Taking long deep breaths may help reverse or decrease the chance of developing breathing (pulmonary) problems (especially infection) following: A long period of time when you are unable to move or be active. BEFORE THE PROCEDURE  If the spirometer includes an indicator to show your best effort, your nurse or respiratory therapist will set it to a desired goal. If possible, sit up straight or lean slightly forward. Try not to slouch. Hold the incentive spirometer in an upright position. INSTRUCTIONS FOR USE  Sit on the edge of your bed if possible, or sit up as far as you can in bed or on a chair. Hold the incentive  spirometer in an upright position. Breathe out normally. Place the mouthpiece in your mouth and seal your lips tightly around it. Breathe in slowly and as deeply as possible, raising the piston or the ball toward the top of the column. Hold your breath for 3-5 seconds or for as long as possible. Allow the piston or ball to fall to the bottom of the column. Remove the mouthpiece from your mouth and breathe out normally. Rest for a few seconds and repeat Steps 1 through 7 at least 10 times every 1-2 hours when you are awake. Take your time and take a few normal breaths between deep breaths. The spirometer may include an indicator to show your best effort. Use the indicator  as a goal to work toward during each repetition. After each set of 10 deep breaths, practice coughing to be sure your lungs are clear. If you have an incision (the cut made at the time of surgery), support your incision when coughing by placing a pillow or rolled up towels firmly against it. Once you are able to get out of bed, walk around indoors and cough well. You may stop using the incentive spirometer when instructed by your caregiver.  RISKS AND COMPLICATIONS Take your time so you do not get dizzy or light-headed. If you are in pain, you may need to take or ask for pain medication before doing incentive spirometry. It is harder to take a deep breath if you are having pain. AFTER USE Rest and breathe slowly and easily. It can be helpful to keep track of a log of your progress. Your caregiver can provide you with a simple table to help with this. If you are using the spirometer at home, follow these instructions: SEEK MEDICAL CARE IF:  You are having difficultly using the spirometer. You have trouble using the spirometer as often as instructed. Your pain medication is not giving enough relief while using the spirometer. You develop fever of 100.5 F (38.1 C) or higher. SEEK IMMEDIATE MEDICAL CARE IF:  You cough up bloody sputum that had not been present before. You develop fever of 102 F (38.9 C) or greater. You develop worsening pain at or near the incision site. MAKE SURE YOU:  Understand these instructions. Will watch your condition. Will get help right away if you are not doing well or get worse. Document Released: 11/08/2006 Document Revised: 09/20/2011 Document Reviewed: 01/09/2007 [employer] Patient Information 2014 [employer], [place].   ________________________________________________________________________ WHAT IS A BLOOD TRANSFUSION? Blood Transfusion Information  A transfusion is the replacement of blood or some of its parts. Blood is made up of multiple cells which  provide different functions. Red blood cells carry oxygen and are used for blood loss replacement. White blood cells fight against infection. Platelets control bleeding. Plasma helps clot blood. Other blood products are available for specialized needs, such as hemophilia or other clotting disorders. BEFORE THE TRANSFUSION  Who gives blood for transfusions?  Healthy volunteers who are fully evaluated to make sure their blood is safe. This is blood bank blood. Transfusion therapy is the safest it has ever been in the practice of medicine. Before blood is taken from a donor, a complete history is taken to make sure that person has no history of diseases nor engages in risky social behavior (examples are intravenous drug use or sexual activity with multiple partners). The donor's travel history is screened to minimize risk of transmitting infections, such as malaria. The donated blood is tested for signs of infectious  diseases, such as HIV and hepatitis. The blood is then tested to be sure it is compatible with you in order to minimize the chance of a transfusion reaction. If you or a relative donates blood, this is often done in anticipation of surgery and is not appropriate for emergency situations. It takes many days to process the donated blood. RISKS AND COMPLICATIONS Although transfusion therapy is very safe and saves many lives, the main dangers of transfusion include:  Getting an infectious disease. Developing a transfusion reaction. This is an allergic reaction to something in the blood you were given. Every precaution is taken to prevent this. The decision to have a blood transfusion has been considered carefully by your caregiver before blood is given. Blood is not given unless the benefits outweigh the risks. AFTER THE TRANSFUSION Right after receiving a blood transfusion, you will usually feel much better and more energetic. This is especially true if your red blood cells have gotten low  (anemic). The transfusion raises the level of the red blood cells which carry oxygen, and this usually causes an energy increase. The nurse administering the transfusion will monitor you carefully for complications. HOME CARE INSTRUCTIONS  No special instructions are needed after a transfusion. You may find your energy is better. Speak with your caregiver about any limitations on activity for underlying diseases you may have. SEEK MEDICAL CARE IF:  Your condition is not improving after your transfusion. You develop redness or irritation at the intravenous (IV) site. SEEK IMMEDIATE MEDICAL CARE IF:  Any of the following symptoms occur over the next 12 hours: Shaking chills. You have a temperature by mouth above 102 F (38.9 C), not controlled by medicine. Chest, back, or muscle pain. People around you feel you are not acting correctly or are confused. Shortness of breath or difficulty breathing. Dizziness and fainting. You get a rash or develop hives. You have a decrease in urine output. Your urine turns a dark color or changes to pink, red, or brown. Any of the following symptoms occur over the next 10 days: You have a temperature by mouth above 102 F (38.9 C), not controlled by medicine. Shortness of breath. Weakness after normal activity. The white part of the eye turns yellow (jaundice). You have a decrease in the amount of urine or are urinating less often. Your urine turns a dark color or changes to pink, red, or brown. Document Released: 06/25/2000 Document Revised: 09/20/2011 Document Reviewed: 02/12/2008 [facility] Patient Information 2014 [place], [place].  _______________________________________________________________________

## 2023-05-30 NOTE — Progress Notes (Signed)
COVID Vaccine Completed:  Yes  Date of COVID positive in last 90 days:  PCP - Gershon Crane, MD Cardiologist -   Chest x-ray - 09-25-22 Epic EKG - 09-25-22 Epic Stress Test -  ECHO -  Cardiac Cath -  Pacemaker/ICD device last checked: Spinal Cord Stimulator:  Bowel Prep -   Sleep Study -  CPAP -   Fasting Blood Sugar -  Checks Blood Sugar _____ times a day  Last dose of GLP1 agonist-  N/A GLP1 instructions:  Hold 7 days before surgery    Last dose of SGLT-2 inhibitors-  N/A SGLT-2 instructions:  Hold 3 days before surgery   Blood Thinner Instructions:  Time Aspirin Instructions: Last Dose:  Activity level:  Can go up a flight of stairs and perform activities of daily living without stopping and without symptoms of chest pain or shortness of breath.  Able to exercise without symptoms  Unable to go up a flight of stairs without symptoms of     Anesthesia review:   Patient denies shortness of breath, fever, cough and chest pain at PAT appointment  Patient verbalized understanding of instructions that were given to them at the PAT appointment. Patient was also instructed that they will need to review over the PAT instructions again at home before surgery.

## 2023-05-31 ENCOUNTER — Encounter (HOSPITAL_COMMUNITY): Payer: Self-pay

## 2023-05-31 ENCOUNTER — Encounter (HOSPITAL_COMMUNITY)
Admission: RE | Admit: 2023-05-31 | Discharge: 2023-05-31 | Disposition: A | Discharge status: Home / Self Care | Payer: Medicare HMO | Source: Ambulatory Visit | Attending: Orthopaedic Surgery | Admitting: Orthopaedic Surgery

## 2023-05-31 ENCOUNTER — Other Ambulatory Visit: Payer: Self-pay

## 2023-05-31 VITALS — BP 140/78 | HR 81 | Temp 98.7°F | Resp 16 | Ht 59.0 in | Wt 171.4 lb

## 2023-05-31 DIAGNOSIS — Z01818 Encounter for other preprocedural examination: Secondary | ICD-10-CM

## 2023-05-31 DIAGNOSIS — M1612 Unilateral primary osteoarthritis, left hip: Secondary | ICD-10-CM | POA: Insufficient documentation

## 2023-05-31 DIAGNOSIS — Z01812 Encounter for preprocedural laboratory examination: Secondary | ICD-10-CM | POA: Insufficient documentation

## 2023-05-31 HISTORY — DX: Essential (primary) hypertension: I10

## 2023-05-31 LAB — COMPREHENSIVE METABOLIC PANEL
ALT: 13 U/L (ref 0–44)
AST: 16 U/L (ref 15–41)
Albumin: 3.7 g/dL (ref 3.5–5.0)
Alkaline Phosphatase: 103 U/L (ref 38–126)
Anion gap: 8 (ref 5–15)
BUN: 16 mg/dL (ref 8–23)
CO2: 30 mmol/L (ref 22–32)
Calcium: 9.4 mg/dL (ref 8.9–10.3)
Chloride: 103 mmol/L (ref 98–111)
Creatinine, Ser: 0.56 mg/dL (ref 0.44–1.00)
GFR, Estimated: >60 mL/min (ref >60)
Glucose, Bld: 163 mg/dL — ABNORMAL HIGH (ref 70–99)
Potassium: 3.9 mmol/L (ref 3.5–5.1)
Sodium: 141 mmol/L (ref 135–145)
Total Bilirubin: 0.3 mg/dL (ref <1.2)
Total Protein: 7.1 g/dL (ref 6.5–8.1)

## 2023-05-31 LAB — CBC
HCT: 41.6 % (ref 36.0–46.0)
Hemoglobin: 13.2 g/dL (ref 12.0–15.0)
MCH: 29.3 pg (ref 26.0–34.0)
MCHC: 31.7 g/dL (ref 30.0–36.0)
MCV: 92.4 fL (ref 80.0–100.0)
Platelets: 338 10*3/uL (ref 150–400)
RBC: 4.5 MIL/uL (ref 3.87–5.11)
RDW: 13.7 % (ref 11.5–15.5)
WBC: 6.8 10*3/uL (ref 4.0–10.5)
nRBC: 0 % (ref 0.0–0.2)

## 2023-06-01 LAB — SURGICAL PCR SCREEN
MRSA, PCR: POSITIVE — AB
Staphylococcus aureus: POSITIVE — AB

## 2023-06-01 NOTE — Progress Notes (Signed)
PCR results sent to Dr.  Magnus Ivan for review

## 2023-06-02 ENCOUNTER — Telehealth: Payer: Self-pay | Admitting: *Deleted

## 2023-06-02 NOTE — H&P (Signed)
TOTAL HIP ADMISSION H&P  Patient is admitted for left total hip arthroplasty.  Subjective:  Chief Complaint: left hip pain  HPI: Aimee Lawrence, 77 y.o. female, has a history of pain and functional disability in the left hip(s) due to arthritis and patient has failed non-surgical conservative treatments for greater than 12 weeks to include NSAID's and/or analgesics, corticosteriod injections, flexibility and strengthening excercises, use of assistive devices, weight reduction as appropriate, and activity modification.  Onset of symptoms was gradual starting 4 years ago with gradually worsening course since that time.The patient noted no past surgery on the left hip(s).  Patient currently rates pain in the left hip at 10 out of 10 with activity. Patient has night pain, worsening of pain with activity and weight bearing, pain that interfers with activities of daily living, and pain with passive range of motion. Patient has evidence of subchondral cysts, subchondral sclerosis, periarticular osteophytes, and joint space narrowing by imaging studies. This condition presents safety issues increasing the risk of falls.  There is no current active infection.  Patient Active Problem List   Diagnosis Date Noted   Status post total replacement of right hip 02/25/2023   Insomnia 02/09/2023   Aortic atherosclerosis (HCC) 10/29/2022   Abnormal finding on imaging 08/29/2017   Right hip pain 08/15/2017   S/P TKR (total knee replacement), right 08/11/2017   Unilateral primary osteoarthritis, left hip 08/11/2017   Primary localized osteoarthritis of right knee 06/15/2016   Primary osteoarthritis of right knee 06/15/2016   Nasal polyps 11/20/2012   Essential hypertension 05/28/2010   DEPRESSION 02/28/2007   Asthma 02/28/2007   Osteopenia 02/28/2007   History of colonic polyps 02/28/2007   Past Medical History:  Diagnosis Date   Allergy    takes Doxycycline daily and uses Flonase daily   Arthritis     Asthma    Albuterol inhaler as needed.   Dyspnea    rarely and sitting   GERD (gastroesophageal reflux disease)    not on any meds   History of bronchitis    History of shingles    Hx of colonic polyps    benign   Hypertension    Joint pain    Joint swelling    Osteopenia    takes Vit D3 and Calcium daily   Primary localized osteoarthritis of right knee     Past Surgical History:  Procedure Laterality Date   APPENDECTOMY     CHOLECYSTECTOMY  2007   lap choli   COLONOSCOPY  10/22/2015   per Dr. Leone Payor, no polyps, repeat in 5 yrs    KNEE CLOSED REDUCTION Right 09/14/2016   Procedure: CLOSED MANIPULATION KNEE;  Surgeon: Marcene Corning, MD;  Location[facility] OR;  Service: Orthopedics;  Laterality: Right;   NASAL POLYP SURGERY  2007   SINUS ENDO W/FUSION  06/27/2012   Procedure: ENDOSCOPIC SINUS SURGERY WITH FUSION NAVIGATION;  Surgeon: Drema Halon, MD;  Location: [facility];  Service: ENT;  Laterality: N/A;  FESS FUSION PROTOCOL   TOTAL HIP ARTHROPLASTY Right 02/25/2023   Procedure: RIGHT TOTAL HIP ARTHROPLASTY ANTERIOR APPROACH;  Surgeon: Kathryne Hitch, MD;  Location: [facility] ORS;  Service: Orthopedics;  Laterality: Right;   TOTAL KNEE ARTHROPLASTY Right 06/15/2016   Procedure: RIGHT TOTAL KNEE ARTHROPLASTY;  Surgeon: Marcene Corning, MD;  Location: [facility] OR;  Service: Orthopedics;  Laterality: Right;    No current facility-administered medications for this encounter.   Current Outpatient Medications  Medication Sig Dispense Refill Last Dose  albuterol (VENTOLIN HFA) 108 (90 Base) MCG/ACT inhaler Inhale 1-2 puffs into the lungs every 6 (six) hours as needed for wheezing or shortness of breath. 8.5 g 0    atorvastatin (LIPITOR) 10 MG tablet Take 1 tablet (10 mg total) by mouth daily. 90 tablet 3    Calcium Carbonate-Vitamin D (CALCIUM 500/D PO) Take 2 tablets by mouth daily. 500 mg-200 mg      EPINEPHrine 0.3 mg/0.3 mL IJ SOAJ injection Inject 0.3 mg into  the muscle as needed for anaphylaxis.      fluticasone (FLONASE) 50 MCG/ACT nasal spray Place 1 spray into both nostrils daily as needed for allergies or rhinitis.      HYDROcodone-acetaminophen (NORCO/VICODIN) 5-325 MG tablet Take 1-2 tablets by mouth every 6 (six) hours as needed for moderate pain (pain score 4-6) (pain score 4-6). 30 tablet 0    LORazepam (ATIVAN) 0.5 MG tablet Take 1 tablet (0.5 mg total) by mouth at bedtime. (Patient taking differently: Take 0.5 mg by mouth at bedtime as needed for anxiety or sleep.) 30 tablet 5    montelukast (SINGULAIR) 10 MG tablet Take 1 tablet by mouth once daily or as directed. 90 tablet 3    omeprazole (PRILOSEC) 40 MG capsule TAKE 1 CAPSULE BY MOUTH TWICE A DAY WITH A MEAL 60 capsule 1    ondansetron (ZOFRAN) 8 MG tablet Take 1 tablet (8 mg total) by mouth every 8 (eight) hours as needed for nausea or vomiting. 60 tablet 3    traMADol (ULTRAM) 50 MG tablet TAKE 2 TABLETS BY MOUTH EVERY 6 HOURS AS NEEDED 180 tablet 5    aspirin EC 81 MG tablet Take 1 tablet (81 mg total) by mouth 2 (two) times daily. Swallow whole. (Patient not taking: Reported on 05/31/2023) 30 tablet 0 Not Taking   Allergies  Allergen Reactions   Aleve [Naproxen Sodium] Shortness Of Breath   Aspirin Shortness Of Breath    Trouble breathing   Ibuprofen Shortness Of Breath    Short of breath   Nsaids Shortness Of Breath   Ampicillin Rash   Erythromycin Nausea And Vomiting    Social History   Tobacco Use   Smoking status: Never   Smokeless tobacco: Never  Substance Use Topics   Alcohol use: No    Alcohol/week: 0.0 standard drinks of alcohol    Comment: no etoh     Family History  Problem Relation Age of Onset   Breast cancer Mother    Pneumonia Mother    AAA (abdominal aortic aneurysm) Father    Alcohol abuse Other    Arthritis Other    Diabetes Other    Heart disease Other    Colon cancer Neg Hx    Stomach cancer Neg Hx      Review of  Systems  Objective:  Physical Exam Vitals reviewed.  Constitutional:      Appearance: Normal appearance. She is obese.  HENT:     Head: Normocephalic and atraumatic.  Eyes:     Extraocular Movements: Extraocular movements intact.     Pupils: Pupils are equal, round, and reactive to light.  Cardiovascular:     Rate and Rhythm: Normal rate and regular rhythm.     Pulses: Normal pulses.  Pulmonary:     Effort: Pulmonary effort is normal.     Breath sounds: Normal breath sounds.  Musculoskeletal:     Cervical back: Normal range of motion and neck supple.     Left hip: Tenderness and bony  tenderness present. Decreased range of motion. Decreased strength.  Neurological:     Mental Status: She is alert and oriented to person, place, and time.  Psychiatric:        Behavior: Behavior normal.     Vital signs in last 24 hours:    Labs:   Estimated body mass index is 34.62 kg/m as calculated from the following:   Height as of 05/31/23: 4\' 11"  (1.499 m).   Weight as of 05/31/23: 77.7 kg.   Imaging Review Plain radiographs demonstrate severe degenerative joint disease of the left hip(s). The bone quality appears to be good for age and reported activity level.      Assessment/Plan:  End stage arthritis, left hip(s)  The patient history, physical examination, clinical judgement of the provider and imaging studies are consistent with end stage degenerative joint disease of the left hip(s) and total hip arthroplasty is deemed medically necessary. The treatment options including medical management, injection therapy, arthroscopy and arthroplasty were discussed at length. The risks and benefits of total hip arthroplasty were presented and reviewed. The risks due to aseptic loosening, infection, stiffness, dislocation/subluxation,  thromboembolic complications and other imponderables were discussed.  The patient acknowledged the explanation, agreed to proceed with the plan and consent  was signed. Patient is being admitted for inpatient treatment for surgery, pain control, PT, OT, prophylactic antibiotics, VTE prophylaxis, progressive ambulation and ADL's and discharge planning.The patient is planning to be discharged home with home health services

## 2023-06-02 NOTE — Telephone Encounter (Signed)
[facility] pre-op call completed. 

## 2023-06-03 ENCOUNTER — Ambulatory Visit (HOSPITAL_BASED_OUTPATIENT_CLINIC_OR_DEPARTMENT_OTHER): Payer: Medicare HMO | Admitting: Certified Registered"

## 2023-06-03 ENCOUNTER — Encounter (HOSPITAL_COMMUNITY)
Admission: RE | Disposition: A | Discharge status: Home / Self Care | Payer: Self-pay | Source: Home / Self Care | Attending: Orthopaedic Surgery

## 2023-06-03 ENCOUNTER — Observation Stay (HOSPITAL_COMMUNITY): Payer: Medicare HMO

## 2023-06-03 ENCOUNTER — Other Ambulatory Visit: Payer: Self-pay

## 2023-06-03 ENCOUNTER — Ambulatory Visit (HOSPITAL_COMMUNITY): Payer: Medicare HMO

## 2023-06-03 ENCOUNTER — Observation Stay (HOSPITAL_COMMUNITY)
Admission: RE | Admit: 2023-06-03 | Discharge: 2023-06-04 | Disposition: A | Discharge status: Home / Self Care | Payer: Medicare HMO | Attending: Orthopaedic Surgery | Admitting: Orthopaedic Surgery

## 2023-06-03 ENCOUNTER — Encounter (HOSPITAL_COMMUNITY): Payer: Self-pay | Admitting: Orthopaedic Surgery

## 2023-06-03 ENCOUNTER — Ambulatory Visit (HOSPITAL_COMMUNITY): Payer: Medicare HMO | Admitting: Certified Registered"

## 2023-06-03 DIAGNOSIS — Z79899 Other long term (current) drug therapy: Secondary | ICD-10-CM | POA: Insufficient documentation

## 2023-06-03 DIAGNOSIS — Z7982 Long term (current) use of aspirin: Secondary | ICD-10-CM | POA: Diagnosis not present

## 2023-06-03 DIAGNOSIS — I10 Essential (primary) hypertension: Secondary | ICD-10-CM | POA: Insufficient documentation

## 2023-06-03 DIAGNOSIS — M1612 Unilateral primary osteoarthritis, left hip: Principal | ICD-10-CM | POA: Insufficient documentation

## 2023-06-03 DIAGNOSIS — Z96641 Presence of right artificial hip joint: Secondary | ICD-10-CM | POA: Insufficient documentation

## 2023-06-03 DIAGNOSIS — J45909 Unspecified asthma, uncomplicated: Secondary | ICD-10-CM | POA: Diagnosis not present

## 2023-06-03 DIAGNOSIS — Z96643 Presence of artificial hip joint, bilateral: Secondary | ICD-10-CM | POA: Diagnosis not present

## 2023-06-03 DIAGNOSIS — Z471 Aftercare following joint replacement surgery: Secondary | ICD-10-CM | POA: Diagnosis not present

## 2023-06-03 DIAGNOSIS — Z96642 Presence of left artificial hip joint: Secondary | ICD-10-CM

## 2023-06-03 HISTORY — PX: TOTAL HIP ARTHROPLASTY: SHX124

## 2023-06-03 LAB — TYPE AND SCREEN
ABO/RH(D): O POS
Antibody Screen: NEGATIVE

## 2023-06-03 SURGERY — ARTHROPLASTY, HIP, TOTAL, ANTERIOR APPROACH
Anesthesia: Spinal | Site: Hip | Laterality: Left

## 2023-06-03 MED ORDER — CEFAZOLIN SODIUM-DEXTROSE 2-4 GM/100ML-% IV SOLN
2.0000 g | INTRAVENOUS | Status: AC
  Administered 2023-06-03: 2 g via INTRAVENOUS
  Filled 2023-06-03: qty 100

## 2023-06-03 MED ORDER — ALBUTEROL SULFATE (2.5 MG/3ML) 0.083% IN NEBU: 2.5000 mg | INHALATION_SOLUTION | Freq: Four times a day (QID) | RESPIRATORY_TRACT | Status: DC | PRN

## 2023-06-03 MED ORDER — MENTHOL 3 MG MT LOZG: 1.0000 | LOZENGE | OROMUCOSAL | Status: DC | PRN

## 2023-06-03 MED ORDER — SODIUM CHLORIDE 0.9 % IV SOLN
INTRAVENOUS | Status: DC
Start: 2023-06-03 — End: 2023-06-04

## 2023-06-03 MED ORDER — TRANEXAMIC ACID-NACL 1000-0.7 MG/100ML-% IV SOLN
1000.0000 mg | INTRAVENOUS | Status: AC
  Administered 2023-06-03: 1000 mg via INTRAVENOUS
  Filled 2023-06-03: qty 100

## 2023-06-03 MED ORDER — ONDANSETRON HCL 4 MG/2ML IJ SOLN
4.0000 mg | Freq: Once | INTRAMUSCULAR | Status: DC | PRN
Start: 2023-06-03 — End: 2023-06-03

## 2023-06-03 MED ORDER — 0.9 % SODIUM CHLORIDE (POUR BTL) OPTIME
TOPICAL | Status: DC | PRN
  Administered 2023-06-03: 1000 mL

## 2023-06-03 MED ORDER — FENTANYL CITRATE (PF) 100 MCG/2ML IJ SOLN
INTRAMUSCULAR | Status: DC | PRN
  Administered 2023-06-03: 50 ug via INTRAVENOUS

## 2023-06-03 MED ORDER — METHOCARBAMOL 1000 MG/10ML IJ SOLN: 500.0000 mg | Freq: Four times a day (QID) | INTRAMUSCULAR | Status: DC | PRN

## 2023-06-03 MED ORDER — HYDROMORPHONE HCL 1 MG/ML IJ SOLN
0.5000 mg | INTRAMUSCULAR | Status: DC | PRN
  Administered 2023-06-03: 0.5 mg via INTRAVENOUS
  Filled 2023-06-03: qty 1

## 2023-06-03 MED ORDER — OXYCODONE HCL 5 MG PO TABS
5.0000 mg | ORAL_TABLET | ORAL | Status: DC | PRN
Start: 2023-06-03 — End: 2023-06-04
  Filled 2023-06-03 (×3): qty 2

## 2023-06-03 MED ORDER — PANTOPRAZOLE SODIUM 40 MG PO TBEC
40.0000 mg | DELAYED_RELEASE_TABLET | Freq: Every day | ORAL | Status: DC
  Administered 2023-06-03 – 2023-06-04 (×2): 40 mg via ORAL
  Filled 2023-06-03 (×2): qty 1

## 2023-06-03 MED ORDER — VANCOMYCIN HCL IN DEXTROSE 1-5 GM/200ML-% IV SOLN
1000.0000 mg | Freq: Two times a day (BID) | INTRAVENOUS | Status: AC
  Administered 2023-06-03: 1000 mg via INTRAVENOUS
  Filled 2023-06-03: qty 200

## 2023-06-03 MED ORDER — ORAL CARE MOUTH RINSE: 15.0000 mL | Freq: Once | OROMUCOSAL | Status: AC

## 2023-06-03 MED ORDER — VANCOMYCIN HCL IN DEXTROSE 1-5 GM/200ML-% IV SOLN
1000.0000 mg | INTRAVENOUS | Status: AC
  Administered 2023-06-03: 1000 mg via INTRAVENOUS
  Filled 2023-06-03: qty 200

## 2023-06-03 MED ORDER — OXYCODONE HCL 5 MG PO TABS
10.0000 mg | ORAL_TABLET | ORAL | Status: DC | PRN
Start: 2023-06-03 — End: 2023-06-04
  Administered 2023-06-03 (×3): 10 mg via ORAL
  Administered 2023-06-04 (×3): 15 mg via ORAL
  Filled 2023-06-03 (×3): qty 3

## 2023-06-03 MED ORDER — CHLORHEXIDINE GLUCONATE 0.12 % MT SOLN
15.0000 mL | Freq: Once | OROMUCOSAL | Status: AC
Start: 2023-06-03 — End: 2023-06-03
  Administered 2023-06-03: 15 mL via OROMUCOSAL

## 2023-06-03 MED ORDER — METOCLOPRAMIDE HCL 5 MG PO TABS: 5.0000 mg | ORAL_TABLET | Freq: Three times a day (TID) | ORAL | Status: DC | PRN

## 2023-06-03 MED ORDER — DEXAMETHASONE SODIUM PHOSPHATE 10 MG/ML IJ SOLN
INTRAMUSCULAR | Status: DC | PRN
  Administered 2023-06-03: 4 mg via INTRAVENOUS

## 2023-06-03 MED ORDER — PROPOFOL 10 MG/ML IV BOLUS
INTRAVENOUS | Status: AC
  Filled 2023-06-03: qty 20

## 2023-06-03 MED ORDER — PROPOFOL 10 MG/ML IV BOLUS
INTRAVENOUS | Status: DC | PRN
  Administered 2023-06-03: 10 mg via INTRAVENOUS
  Administered 2023-06-03: 20 mg via INTRAVENOUS

## 2023-06-03 MED ORDER — ONDANSETRON HCL 4 MG/2ML IJ SOLN
INTRAMUSCULAR | Status: DC | PRN
  Administered 2023-06-03: 4 mg via INTRAVENOUS

## 2023-06-03 MED ORDER — ALUM & MAG HYDROXIDE-SIMETH 200-200-20 MG/5ML PO SUSP
30.0000 mL | ORAL | Status: DC | PRN
Start: 2023-06-03 — End: 2023-06-04

## 2023-06-03 MED ORDER — CHLORHEXIDINE GLUCONATE 4 % EX SOLN: 1.0000 | CUTANEOUS | 1 refills | Status: AC

## 2023-06-03 MED ORDER — PROPOFOL 500 MG/50ML IV EMUL
INTRAVENOUS | Status: DC | PRN
  Administered 2023-06-03: 75 ug/kg/min via INTRAVENOUS

## 2023-06-03 MED ORDER — FENTANYL CITRATE (PF) 100 MCG/2ML IJ SOLN
INTRAMUSCULAR | Status: AC
  Filled 2023-06-03: qty 2

## 2023-06-03 MED ORDER — BUPIVACAINE IN DEXTROSE 0.75-8.25 % IT SOLN
INTRATHECAL | Status: DC | PRN
  Administered 2023-06-03: 1.6 mL via INTRATHECAL

## 2023-06-03 MED ORDER — ONDANSETRON HCL 4 MG/2ML IJ SOLN
INTRAMUSCULAR | Status: AC
  Filled 2023-06-03: qty 2

## 2023-06-03 MED ORDER — ONDANSETRON HCL 4 MG PO TABS: 4.0000 mg | ORAL_TABLET | Freq: Four times a day (QID) | ORAL | Status: DC | PRN

## 2023-06-03 MED ORDER — MONTELUKAST SODIUM 10 MG PO TABS: 10.0000 mg | ORAL_TABLET | Freq: Every day | ORAL | Status: DC

## 2023-06-03 MED ORDER — ATORVASTATIN CALCIUM 10 MG PO TABS
10.0000 mg | ORAL_TABLET | Freq: Every day | ORAL | Status: DC
  Administered 2023-06-03 – 2023-06-04 (×2): 10 mg via ORAL
  Filled 2023-06-03 (×2): qty 1

## 2023-06-03 MED ORDER — METHOCARBAMOL 500 MG PO TABS
500.0000 mg | ORAL_TABLET | Freq: Four times a day (QID) | ORAL | Status: DC | PRN
  Administered 2023-06-03 – 2023-06-04 (×2): 500 mg via ORAL
  Filled 2023-06-03 (×2): qty 1

## 2023-06-03 MED ORDER — METOCLOPRAMIDE HCL 5 MG/ML IJ SOLN: 5.0000 mg | Freq: Three times a day (TID) | INTRAMUSCULAR | Status: DC | PRN

## 2023-06-03 MED ORDER — ACETAMINOPHEN 325 MG PO TABS
325.0000 mg | ORAL_TABLET | Freq: Four times a day (QID) | ORAL | Status: DC | PRN
Start: 2023-06-04 — End: 2023-06-04

## 2023-06-03 MED ORDER — PHENOL 1.4 % MT LIQD: 1.0000 | OROMUCOSAL | Status: DC | PRN

## 2023-06-03 MED ORDER — SODIUM CHLORIDE 0.9 % IR SOLN
Status: DC | PRN
  Administered 2023-06-03: 1

## 2023-06-03 MED ORDER — HYDROMORPHONE HCL 1 MG/ML IJ SOLN: 0.2500 mg | INTRAMUSCULAR | Status: DC | PRN

## 2023-06-03 MED ORDER — LIDOCAINE HCL (PF) 2 % IJ SOLN
INTRAMUSCULAR | Status: AC
Start: 2023-06-03 — End: ?
  Filled 2023-06-03: qty 5

## 2023-06-03 MED ORDER — DOCUSATE SODIUM 100 MG PO CAPS
100.0000 mg | ORAL_CAPSULE | Freq: Two times a day (BID) | ORAL | Status: DC
  Administered 2023-06-03 – 2023-06-04 (×3): 100 mg via ORAL
  Filled 2023-06-03 (×3): qty 1

## 2023-06-03 MED ORDER — LACTATED RINGERS IV SOLN: INTRAVENOUS | Status: DC

## 2023-06-03 MED ORDER — OXYCODONE HCL 5 MG PO TABS: 5.0000 mg | ORAL_TABLET | Freq: Once | ORAL | Status: DC | PRN

## 2023-06-03 MED ORDER — DIPHENHYDRAMINE HCL 12.5 MG/5ML PO ELIX: 12.5000 mg | ORAL_SOLUTION | ORAL | Status: DC | PRN

## 2023-06-03 MED ORDER — POVIDONE-IODINE 10 % EX SWAB: 2.0000 | Freq: Once | CUTANEOUS | Status: DC

## 2023-06-03 MED ORDER — ONDANSETRON HCL 4 MG/2ML IJ SOLN: 4.0000 mg | Freq: Four times a day (QID) | INTRAMUSCULAR | Status: DC | PRN

## 2023-06-03 MED ORDER — LIDOCAINE 2% (20 MG/ML) 5 ML SYRINGE
INTRAMUSCULAR | Status: DC | PRN
  Administered 2023-06-03: 40 mg via INTRAVENOUS

## 2023-06-03 MED ORDER — ASPIRIN 81 MG PO CHEW: 81.0000 mg | CHEWABLE_TABLET | Freq: Two times a day (BID) | ORAL | Status: DC

## 2023-06-03 MED ORDER — OXYCODONE HCL 5 MG/5ML PO SOLN: 5.0000 mg | Freq: Once | ORAL | Status: DC | PRN

## 2023-06-03 MED ORDER — LORAZEPAM 0.5 MG PO TABS: 0.5000 mg | ORAL_TABLET | Freq: Every evening | ORAL | Status: DC | PRN

## 2023-06-03 MED ORDER — DEXAMETHASONE SODIUM PHOSPHATE 10 MG/ML IJ SOLN
INTRAMUSCULAR | Status: AC
  Filled 2023-06-03: qty 1

## 2023-06-03 MED ORDER — MUPIROCIN 2 % EX OINT: 1.0000 | TOPICAL_OINTMENT | Freq: Two times a day (BID) | CUTANEOUS | 0 refills | Status: AC

## 2023-06-03 MED ORDER — STERILE WATER FOR IRRIGATION IR SOLN
Status: DC | PRN
  Administered 2023-06-03: 1000 mL

## 2023-06-03 MED ORDER — PHENYLEPHRINE HCL-NACL 20-0.9 MG/250ML-% IV SOLN
INTRAVENOUS | Status: DC | PRN
  Administered 2023-06-03: 20 ug/min via INTRAVENOUS

## 2023-06-03 SURGICAL SUPPLY — 36 items
ARTICULEZE HEAD (Hips) ×1 IMPLANT
BAG COUNTER SPONGE SURGICOUNT (BAG) ×2 IMPLANT
BAG ZIPLOCK 12X15 (MISCELLANEOUS) IMPLANT
BENZOIN TINCTURE PRP APPL 2/3 (GAUZE/BANDAGES/DRESSINGS) IMPLANT
BLADE SAW SGTL 18X1.27X75 (BLADE) ×2 IMPLANT
COVER PERINEAL POST (MISCELLANEOUS) ×2 IMPLANT
COVER SURGICAL LIGHT HANDLE (MISCELLANEOUS) ×2 IMPLANT
DRAPE FOOT SWITCH (DRAPES) ×2 IMPLANT
DRAPE STERI IOBAN 125X83 (DRAPES) ×2 IMPLANT
DRAPE U-SHAPE 47X51 STRL (DRAPES) ×4 IMPLANT
DRSG AQUACEL AG ADV 3.5X10 (GAUZE/BANDAGES/DRESSINGS) ×2 IMPLANT
DURAPREP 26ML APPLICATOR (WOUND CARE) ×2 IMPLANT
ELECT REM PT RETURN 15FT ADLT (MISCELLANEOUS) ×2 IMPLANT
GAUZE XEROFORM 1X8 LF (GAUZE/BANDAGES/DRESSINGS) IMPLANT
GLOVE BIO SURGEON STRL SZ7.5 (GLOVE) ×2 IMPLANT
GLOVE BIOGEL PI IND STRL 8 (GLOVE) ×4 IMPLANT
GLOVE ECLIPSE 8.0 STRL XLNG CF (GLOVE) ×2 IMPLANT
GOWN STRL REUS W/ TWL XL LVL3 (GOWN DISPOSABLE) ×4 IMPLANT
HEAD ARTICULEZE (Hips) IMPLANT
HOLDER FOLEY CATH W/STRAP (MISCELLANEOUS) ×2 IMPLANT
KIT TURNOVER KIT A (KITS) IMPLANT
LINER NEUTRAL 52X36MM PLUS 4 (Liner) IMPLANT
PACK ANTERIOR HIP CUSTOM (KITS) ×2 IMPLANT
PIN SECTOR W/GRIP ACE CUP 52MM (Hips) IMPLANT
SET HNDPC FAN SPRY TIP SCT (DISPOSABLE) ×2 IMPLANT
STAPLER SKIN PROX WIDE 3.9 (STAPLE) IMPLANT
STEM FEM ACTIS HIGH SZ3 (Stem) IMPLANT
STRIP CLOSURE SKIN 1/2X4 (GAUZE/BANDAGES/DRESSINGS) IMPLANT
SUT ETHIBOND NAB CT1 #1 30IN (SUTURE) ×2 IMPLANT
SUT ETHILON 2 0 PS N (SUTURE) IMPLANT
SUT MNCRL AB 4-0 PS2 18 (SUTURE) IMPLANT
SUT VIC AB 0 CT1 36 (SUTURE) ×2 IMPLANT
SUT VIC AB 1 CT1 36 (SUTURE) ×2 IMPLANT
SUT VIC AB 2-0 CT1 TAPERPNT 27 (SUTURE) ×4 IMPLANT
TRAY FOLEY MTR SLVR 16FR STAT (SET/KITS/TRAYS/PACK) IMPLANT
YANKAUER SUCT BULB TIP NO VENT (SUCTIONS) ×2 IMPLANT

## 2023-06-03 NOTE — Op Note (Signed)
Operative Note  Date of operation: 06/03/2023 Preoperative diagnosis: Left hip primary osteoarthritis Postoperative diagnosis: Same  Procedure: Left direct anterior total hip arthroplasty  Implants: Implant Name Type Inv. Item Serial No. Manufacturer Lot No. LRB No. Used Action  PIN SECTOR W/GRIP ACE CUP - [id number] Hips PIN SECTOR W/GRIP ACE CUP  [employer]PU[employer] [id number] Left 1 Implanted  LINER NEUTRAL [id number] PLUS 4 - [id number] Liner LINER NEUTRAL [id number] PLUS 4  DEPUY ORTHOPA[employer] [id number] Left 1 Implanted  STEM FEM ACTIS HIGH SZ3 - [id number] Stem STEM FEM ACTIS HIGH SZ3  DEPUY ORTHOPAEDICS [id number] Left 1 Implanted  ARTICULEZE HEAD - [id number] Hips ARTICULEZE HEAD  DEPUY ORTHOPA[employer] [id number] Left 1 Implanted   Surgeon: Vanita Panda. Magnus Ivan, MD Assistant: Rexene Edison, PA-C  Anesthesia: Spinal EBL: 250 cc Antibiotics: IV Ancef and IV vancomycin Complications: None  Indications: The patient is a 77 year old active female with debilitating arthritis involving her hips.  We actually replaced her right hip successfully earlier this year and now she presents for her left hip to be replaced.  Her x-rays show complete loss of the joint space with bone-on-bone wear.  She has daily left hip pain that is detrimentally affecting her mobility, her quality of life and her actives daily living to the point she does wish to proceed with a hip replacement on the left side.  We spoke in length in detail about the risk of acute blood loss anemia, nerve or vessel injury, fracture, infection, dislocation, DVT, implant failure, leg length differences and wound healing issues.  She understands that our goals are hopefully decrease pain, improve mobility, and improve quality of life.  Procedure description: After informed consent was obtained and the appropriate left hip was marked, the patient was brought to the operating room and set up on the stretcher where spinal anesthesia was obtained.   She was laid in supine position on stretcher and a Foley catheter was placed.  Traction boots were placed on both her feet and next she was placed supine on the Hana fracture table with a perineal post in place in both legs and inline skeletal traction devices but no traction applied.  Her left operative hip and pelvis were assessed radiographically.  The left hip was then prepped and draped with DuraPrep and sterile drapes.  A timeout was called and she was identified as correct patient the correct the left hip.  An incision was then made just inferior and posterior to the ASIS and carried slightly obliquely down the leg.  Dissection was carried down to the tensor fascia lata muscle and the tensor fascia was divided longitudinally to proceed with a direct anterior approach to the hip.  Circumflex vessels were identified and cauterized.  The hip capsule was identified and opened up in L-type format finding a moderate joint effusion.  Cobra retractors were placed around the medial and lateral femoral neck and a femoral neck cut was made with an oscillating saw just proximal to the lesser trochanter.  This cut was completed with an osteotome.  A corkscrew guide was placed in the femoral head and the femoral head was removed in its entirety and there was a wide area devoid of cartilage.  A bent Hohmann was then placed over the medial acetabular rim and remnants of the acetabular labrum and other debris removed.  Reaming was then initiated under direct visualization from a size 43 reamer and stepwise increments going up to a size 51 reamer with the last reamer also placed under  direct visualization and direct fluoroscopy in order to obtain the depth of reaming, the inclination and the anteversion.  The real DePuy sector GRIPTION acetabular component size 52 was then placed without difficulty and we verified the placement under fluoroscopy.  We then placed a 36+4 polythene liner based on medialization and the patient's  offset.  Patient was then turned to the femur.  With the left leg externally rotated to 120 degrees, extended and adducted, a Mueller retractor was placed medially and a Hohmann retractor behind the greater trochanter.  The lateral joint capsule was released and a box cutting osteotome was used in her femoral canal.  Broaching was then initiated using the Actis broaching system from a size 0 going up to a size 3.  With a size 3 in place we trialed a standard offset femoral neck and a 36+1.5 trial hip ball.  The left leg was brought over and up and with traction and internal rotation reduced in the pelvis.  Based on radiographic and clinical assessment we needed more offset and leg length.  We dislocated the hip and removed the trial components.  We then placed the real Actis femoral component size 3 with high offset and the real 36+5 metal head ball.  This reduced in the acetabulum and we assessed it clinically and radiographically and it was tight.  We felt like we improved her leg length and offset.  It felt stable on exam.  The soft tissue was then irrigated with normal saline solution.  The joint capsule was closed with interrupted #1 Ethibond suture followed by a #1 Vicryl to close the tensor fascia.  0 Vicryl was used to close the deep tissue and 2-0 Vicryl was used to close subcutaneous tissue.  The skin was closed with staples.  An Aquacel dressing was applied.  The patient was taken the recovery room in stable condition.  Rexene Edison, PA-C did assist during the entire case from beginning to end and his assistance was crucial and medically necessary for soft tissue management and retraction, helping guide implant placement and a layered closure of the wound.

## 2023-06-03 NOTE — Anesthesia Procedure Notes (Signed)
Spinal  Patient location during procedure: OR Start time: 06/03/2023 7:23 AM Reason for block: surgical anesthesia Staffing Performed: resident/CRNA  Anesthesiologist: Eilene Ghazi, MD Resident/CRNA: Sindy Guadeloupe, CRNA Performed by: Sindy Guadeloupe, CRNA Authorized by: Sindy Guadeloupe, CRNA   Preanesthetic Checklist Completed: patient identified, IV checked, site marked, risks and benefits discussed, surgical consent, monitors and equipment checked, pre-op evaluation and timeout performed Spinal Block Patient position: sitting Prep: DuraPrep and site prepped and draped Patient monitoring: continuous pulse ox, blood pressure, heart rate and cardiac monitor Approach: midline Location: L3-4 Injection technique: single-shot Needle Needle type: Pencan  Needle gauge: 24 G Needle length: 10 cm Assessment Events: CSF return Additional Notes Pt placed in sitting position, spinal kit expiration date checked and verified, timeout performed, Dr Okey Dupre present and supervising throughout SAB placement, + CSF, - heme, pt tolerated well. Adequate sensory level.

## 2023-06-03 NOTE — Anesthesia Preprocedure Evaluation (Signed)
Anesthesia Evaluation  Patient identified by MRN, date of birth, ID band Patient awake    Reviewed: Allergy & Precautions, H&P , NPO status , Patient's Chart, lab work & pertinent test results  Airway Mallampati: II  TM Distance: >3 FB Neck ROM: Full    Dental no notable dental hx.    Pulmonary asthma    Pulmonary exam normal breath sounds clear to auscultation       Cardiovascular hypertension, Pt. on medications Normal cardiovascular exam Rhythm:Regular Rate:Normal     Neuro/Psych negative neurological ROS  negative psych ROS   GI/Hepatic negative GI ROS, Neg liver ROS,,,  Endo/Other  negative endocrine ROS    Renal/GU negative Renal ROS  negative genitourinary   Musculoskeletal  (+) Arthritis , Osteoarthritis,    Abdominal   Peds negative pediatric ROS (+)  Hematology negative hematology ROS (+)   Anesthesia Other Findings   Reproductive/Obstetrics negative OB ROS                             Anesthesia Physical Anesthesia Plan  ASA: 2  Anesthesia Plan: Spinal   Post-op Pain Management: Regional block*   Induction: Intravenous  PONV Risk Score and Plan: 2 and Propofol infusion, Ondansetron and Treatment may vary due to age or medical condition  Airway Management Planned: Simple Face Mask  Additional Equipment:   Intra-op Plan:   Post-operative Plan:   Informed Consent: I have reviewed the patients History and Physical, chart, labs and discussed the procedure including the risks, benefits and alternatives for the proposed anesthesia with the patient or authorized representative who has indicated his/her understanding and acceptance.     Dental advisory given  Plan Discussed with: CRNA and Surgeon  Anesthesia Plan Comments:        Anesthesia Quick Evaluation

## 2023-06-03 NOTE — Anesthesia Procedure Notes (Signed)
Procedure Name: MAC Date/Time: 06/03/2023 7:15 AM  Performed by: Sindy Guadeloupe, CRNAPre-anesthesia Checklist: Patient identified, Emergency Drugs available, Suction available, Patient being monitored and Timeout performed Oxygen Delivery Method: Simple face mask Placement Confirmation: positive ETCO2

## 2023-06-03 NOTE — Anesthesia Postprocedure Evaluation (Signed)
Anesthesia Post Note  Patient: Aimee Lawrence  Procedure(s) Performed: LEFT TOTAL HIP ARTHROPLASTY ANTERIOR APPROACH (Left: Hip)     Patient location during evaluation: PACU Anesthesia Type: Spinal Level of consciousness: oriented and awake and alert Pain management: pain level controlled Vital Signs Assessment: post-procedure vital signs reviewed and stable Respiratory status: spontaneous breathing, respiratory function stable and patient connected to nasal cannula oxygen Cardiovascular status: blood pressure returned to baseline and stable Postop Assessment: no headache, no backache and no apparent nausea or vomiting Anesthetic complications: no  No notable events documented.  Last Vitals:  Vitals:   06/03/23 0930 06/03/23 0945  BP: 139/79 122/63  Pulse: 63 65  Resp: 13 14  Temp:  36.5 C  SpO2: 95% 93%    Last Pain:  Vitals:   06/03/23 0945  TempSrc:   PainSc: 0-No pain                 Witten Certain S

## 2023-06-03 NOTE — Interval H&P Note (Signed)
History and Physical Interval Note: The patient understands that she is here today for a left hip replacement to treat her severe left hip arthritis.  There has been no acute or interval change in her medical status.  The risks and benefits of surgery have been discussed in detail and informed consent has been obtained.  The left operative hip has been marked.  06/03/2023 7:03 AM  Aimee Lawrence  has presented today for surgery, with the diagnosis of osteoarthritis left hip.  The various methods of treatment have been discussed with the patient and family. After consideration of risks, benefits and other options for treatment, the patient has consented to  Procedure(s): LEFT TOTAL HIP ARTHROPLASTY ANTERIOR APPROACH (Left) as a surgical intervention.  The patient's history has been reviewed, patient examined, no change in status, stable for surgery.  I have reviewed the patient's chart and labs.  Questions were answered to the patient's satisfaction.     Kathryne Hitch

## 2023-06-03 NOTE — Transfer of Care (Signed)
Immediate Anesthesia Transfer of Care Note  Patient: Aimee Lawrence  Procedure(s) Performed: LEFT TOTAL HIP ARTHROPLASTY ANTERIOR APPROACH (Left: Hip)  Patient Location: PACU  Anesthesia Type:Spinal  Level of Consciousness: awake, drowsy, and patient cooperative  Airway & Oxygen Therapy: Patient Spontanous Breathing and Patient connected to face mask oxygen  Post-op Assessment: Report given to RN and Post -op Vital signs reviewed and stable  Post vital signs: Reviewed and stable  Last Vitals:  Vitals Value Taken Time  BP 106/53 06/03/23 0856  Temp    Pulse 64 06/03/23 0858  Resp 16 06/03/23 0858  SpO2 100 % 06/03/23 0858  Vitals shown include unfiled device data.  Last Pain:  Vitals:   06/03/23 0548  TempSrc:   PainSc: 3       Patients Stated Pain Goal: 3 (06/03/23 0548)  Complications: No notable events documented.

## 2023-06-03 NOTE — Evaluation (Signed)
Physical Therapy Evaluation Patient Details Name: Aimee Lawrence MRN: [id number] DOB: May 27, 1946 Today's Date: 06/03/2023  History of Present Illness  Pt s/p R THR and with hx of R TKR and osteopenia  Clinical Impression  Pt s/p R THR and presents with decreased R LE strength/ROm and post op pain limiting functional mobility.  Pt should progress to dc home with assist of friends.        If plan is discharge home, recommend the following: A little help with walking and/or transfers;A little help with bathing/dressing/bathroom;Assistance with cooking/housework;Assist for transportation;Help with stairs or ramp for entrance   Can travel by private vehicle        Equipment Recommendations None recommended by PT  Recommendations for Other Services       Functional Status Assessment Patient has had a recent decline in their functional status and demonstrates the ability to make significant improvements in function in a reasonable and predictable amount of time.     Precautions / Restrictions Precautions Precautions: Fall Restrictions Weight Bearing Restrictions: No Other Position/Activity Restrictions: WBAT      Mobility  Bed Mobility Overal bed mobility: Needs Assistance Bed Mobility: Supine to Sit     Supine to sit: Min assist, Mod assist     General bed mobility comments: Increased time with cues for sequence, use of R LE to self assist.  Physical assist to manage L LE and to complete rotation using bed pad    Transfers Overall transfer level: Needs assistance Equipment used: Rolling walker (2 wheels) Transfers: Sit to/from Stand Sit to Stand: Min assist, From elevated surface           General transfer comment: cues for LE management and use of UEs to self assist    Ambulation/Gait Ambulation/Gait assistance: Min assist Gait Distance (Feet): 35 Feet Assistive device: Rolling walker (2 wheels) Gait Pattern/deviations: Step-to pattern, Decreased step  length - right, Decreased step length - left, Shuffle Gait velocity: decr     General Gait Details: cues for sequence, posture and position from RW; distance pain limited  Stairs            Wheelchair Mobility     Tilt Bed    Modified Rankin (Stroke Patients Only)       Balance Overall balance assessment: Needs assistance Sitting-balance support: No upper extremity supported, Feet supported Sitting balance-Leahy Scale: Good     Standing balance support: Single extremity supported Standing balance-Leahy Scale: Poor                               Pertinent Vitals/Pain Pain Assessment Pain Assessment: 0-10 Pain Score: 6  Pain Location: L hip Pain Descriptors / Indicators: Aching, Sore Pain Intervention(s): Limited activity within patient's tolerance, Monitored during session, Premedicated before session, Ice applied    Home Living Family/patient expects to be discharged to:: Private residence Living Arrangements: Alone Available Help at Discharge: Family;Friend(s);Available 24 hours/day Type of Home: House Home Access: Stairs to enter;Ramped entrance Entrance Stairs-Rails: Right Entrance Stairs-Number of Steps: 5   Home Layout: One level Home Equipment: Cane - single Librarian, academic (2 wheels)      Prior Function Prior Level of Function : Independent/Modified Independent             Mobility Comments: using cane as needed       Extremity/Trunk Assessment   Upper Extremity Assessment Upper Extremity Assessment: Overall WFL for tasks assessed  Lower Extremity Assessment Lower Extremity Assessment: LLE deficits/detail;RLE deficits/detail RLE Deficits / Details: [employer] to ~ 70    Cervical / Trunk Assessment Cervical / Trunk Assessment: Normal  Communication   Communication Communication: No apparent difficulties  Cognition Arousal: Alert Behavior During Therapy: WFL for tasks assessed/performed Overall Cognitive  Status: Within Functional Limits for tasks assessed                                          General Comments      Exercises Total Joint Exercises Ankle Circles/Pumps: AROM, Both, 15 reps, Supine   Assessment/Plan    PT Assessment Patient needs continued PT services  PT Problem List Decreased strength;Decreased range of motion;Decreased activity tolerance;Decreased balance;Decreased mobility;Decreased knowledge of use of DME;Pain       PT Treatment Interventions DME instruction;Gait training;Stair training;Functional mobility training;Therapeutic activities;Therapeutic exercise;Patient/family education    PT Goals (Current goals can be found in the Care Plan section)  Acute Rehab PT Goals Patient Stated Goal: REgain IND PT Goal Formulation: With patient Time For Goal Achievement: 06/10/23 Potential to Achieve Goals: Good    Frequency 7X/week     Co-evaluation               AM-PAC PT "6 Clicks" Mobility  Outcome Measure Help needed turning from your back to your side while in a flat bed without using bedrails?: A Lot Help needed moving from lying on your back to sitting on the side of a flat bed without using bedrails?: A Lot Help needed moving to and from a bed to a chair (including a wheelchair)?: A Lot Help needed standing up from a chair using your arms (e.g., wheelchair or bedside chair)?: A Little Help needed to walk in hospital room?: A Little Help needed climbing 3-5 steps with a railing? : A Lot 6 Click Score: 14    End of Session Equipment Utilized During Treatment: Gait belt Activity Tolerance: Patient tolerated treatment well Patient left: in chair;with call bell/phone within reach;with chair alarm set;with family/visitor present Nurse Communication: Mobility status PT Visit Diagnosis: Unsteadiness on feet (R26.81);Difficulty in walking, not elsewhere classified (R26.2)    Time: 2440-1027 PT Time Calculation (min) (ACUTE ONLY): 28  min   Charges:   PT Evaluation $PT Eval Low Complexity: 1 Low PT Treatments $Gait Training: 8-22 mins PT General Charges $$ ACUTE PT VISIT: 1 Visit         Mauro Kaufmann PT Acute Rehabilitation Services Pager [phone] Office [phone]   Garyson Stelly 06/03/2023, 3:26 PM

## 2023-06-03 NOTE — TOC Transition Note (Signed)
Transition of Care [facility]) - CM/SW Discharge Note   Patient Details  Name: Aimee Lawrence MRN: [id number] Date of Birth: 1946-01-24  Transition of [facility]) CM/SW Contact:  Amada Jupiter, LCSW Phone Number: 06/03/2023, 3:42 PM   Clinical Narrative:     Met with pt who confirms she has needed DME in the home.  HHPT prearranged with [facility] via ortho MD office prior to surgery - pt aware.  No further TOC needs.  Final next level of care: Home w Home Health Services Barriers to Discharge: No Barriers Identified   Patient Goals and CMS Choice      Discharge Placement                         Discharge Plan and Services Additional resources added to the After Visit Summary for                  DME Arranged: N/A DME Agency: NA       HH Arranged: PT HH Agency: [facility]        Social Determinants of Health (SDOH) Interventions SDOH Screenings   Food Insecurity: No Food Insecurity (06/03/2023)  Housing: Patient Declined (06/03/2023)  Transportation Needs: No Transportation Needs (06/03/2023)  Utilities: Not At Risk (06/03/2023)  Alcohol Screen: Low Risk  (10/06/2022)  Depression (PHQ2-9): Low Risk  (11/08/2022)  Financial Resource Strain: Low Risk  (10/06/2022)  Physical Activity: Unknown (10/06/2022)  Social Connections: Unknown (10/06/2022)  Stress: No Stress Concern Present (10/06/2022)  Tobacco Use: Low Risk  (06/03/2023)     Readmission Risk Interventions     No data to display

## 2023-06-04 DIAGNOSIS — Z79899 Other long term (current) drug therapy: Secondary | ICD-10-CM | POA: Diagnosis not present

## 2023-06-04 DIAGNOSIS — I10 Essential (primary) hypertension: Secondary | ICD-10-CM | POA: Diagnosis not present

## 2023-06-04 DIAGNOSIS — M1612 Unilateral primary osteoarthritis, left hip: Secondary | ICD-10-CM | POA: Diagnosis not present

## 2023-06-04 DIAGNOSIS — J45909 Unspecified asthma, uncomplicated: Secondary | ICD-10-CM | POA: Diagnosis not present

## 2023-06-04 DIAGNOSIS — Z7982 Long term (current) use of aspirin: Secondary | ICD-10-CM | POA: Diagnosis not present

## 2023-06-04 DIAGNOSIS — Z96641 Presence of right artificial hip joint: Secondary | ICD-10-CM | POA: Diagnosis not present

## 2023-06-04 LAB — CBC
HCT: 32.8 % — ABNORMAL LOW (ref 36.0–46.0)
Hemoglobin: 10.5 g/dL — ABNORMAL LOW (ref 12.0–15.0)
MCH: 29.3 pg (ref 26.0–34.0)
MCHC: 32 g/dL (ref 30.0–36.0)
MCV: 91.6 fL (ref 80.0–100.0)
Platelets: 248 10*3/uL (ref 150–400)
RBC: 3.58 MIL/uL — ABNORMAL LOW (ref 3.87–5.11)
RDW: 13.7 % (ref 11.5–15.5)
WBC: 13.5 10*3/uL — ABNORMAL HIGH (ref 4.0–10.5)
nRBC: 0 % (ref 0.0–0.2)

## 2023-06-04 LAB — BASIC METABOLIC PANEL
Anion gap: 7 (ref 5–15)
BUN: 16 mg/dL (ref 8–23)
CO2: 28 mmol/L (ref 22–32)
Calcium: 8.3 mg/dL — ABNORMAL LOW (ref 8.9–10.3)
Chloride: 101 mmol/L (ref 98–111)
Creatinine, Ser: 0.66 mg/dL (ref 0.44–1.00)
GFR, Estimated: >60 mL/min (ref >60)
Glucose, Bld: 196 mg/dL — ABNORMAL HIGH (ref 70–99)
Potassium: 3.5 mmol/L (ref 3.5–5.1)
Sodium: 136 mmol/L (ref 135–145)

## 2023-06-04 MED ORDER — OXYCODONE HCL 5 MG PO TABS: 5.0000 mg | ORAL_TABLET | Freq: Four times a day (QID) | ORAL | 0 refills | Status: DC | PRN

## 2023-06-04 MED ORDER — TIZANIDINE HCL 2 MG PO TABS: 2.0000 mg | ORAL_TABLET | Freq: Three times a day (TID) | ORAL | 0 refills | Status: DC | PRN

## 2023-06-04 NOTE — Discharge Instructions (Signed)

## 2023-06-04 NOTE — Progress Notes (Signed)
Subjective: 1 Day Post-Op Procedure(s) (LRB): LEFT TOTAL HIP ARTHROPLASTY ANTERIOR APPROACH (Left) Patient reports pain as moderate.    Objective: Vital signs in last 24 hours: Temp:  [97.7 F (36.5 C)-99 F (37.2 C)] 98.1 F (36.7 C) (11/23 0928) Pulse Rate:  [84-102] 95 (11/23 0928) Resp:  [15-17] 16 (11/23 0928) BP: (118-164)/(35-79) 141/74 (11/23 0928) SpO2:  [93 %-100 %] 94 % (11/23 0928)  Intake/Output from previous day: 11/22 0701 - 11/23 0700 In: 4129.3 [P.O.:240; I.V.:1863; IV Piggyback:2026.3] Out: 1560 [Urine:1310; Blood:250] Intake/Output this shift: Total I/O In: -  Out: 200 [Urine:200]  Recent Labs    06/04/23 0421  HGB 10.5*   Recent Labs    06/04/23 0421  WBC 13.5*  RBC 3.58*  HCT 32.8*  PLT 248   Recent Labs    06/04/23 0421  NA 136  K 3.5  CL 101  CO2 28  BUN 16  CREATININE 0.66  GLUCOSE 196*  CALCIUM 8.3*   No results for input(s): "LABPT", "INR" in the last 72 hours.  Sensation intact distally Intact pulses distally Dorsiflexion/Plantar flexion intact Incision: dressing C/D/I   Assessment/Plan: 1 Day Post-Op Procedure(s) (LRB): LEFT TOTAL HIP ARTHROPLASTY ANTERIOR APPROACH (Left) Up with therapy Discharge home with home health      Aimee Lawrence 06/04/2023, 10:45 AM

## 2023-06-04 NOTE — Discharge Summary (Signed)
Patient ID: Aimee Lawrence MRN: [id number] DOB/AGE: Jun 27, 1946 77 y.o.  Admit date: 06/03/2023 Discharge date: 06/04/2023  Admission Diagnoses:  Principal Problem:   Unilateral primary osteoarthritis, left hip Active Problems:   Status post total replacement of left hip   Discharge Diagnoses:  Same  Past Medical History:  Diagnosis Date   Allergy    takes Doxycycline daily and uses Flonase daily   Arthritis    Asthma    Albuterol inhaler as needed.   Dyspnea    rarely and sitting   GERD (gastroesophageal reflux disease)    not on any meds   History of bronchitis    History of shingles    Hx of colonic polyps    benign   Hypertension    Joint pain    Joint swelling    Osteopenia    takes Vit D3 and Calcium daily   Primary localized osteoarthritis of right knee     Surgeries: Procedure(s): LEFT TOTAL HIP ARTHROPLASTY ANTERIOR APPROACH on 06/03/2023   Consultants:   Discharged Condition: Improved  Hospital Course: Aimee Lawrence is an 77 y.o. female who was admitted 06/03/2023 for operative treatment ofUnilateral primary osteoarthritis, left hip. Patient has severe unremitting pain that affects sleep, daily activities, and work/hobbies. After pre-op clearance the patient was taken to the operating room on 06/03/2023 and underwent  Procedure(s): LEFT TOTAL HIP ARTHROPLASTY ANTERIOR APPROACH.    Patient was given perioperative antibiotics:  Anti-infectives (From admission, onward)    Start     Dose/Rate Route Frequency Ordered Stop   06/03/23 1830  vancomycin (VANCOCIN) IVPB 1000 mg/200 mL premix        1,000 mg 200 mL/hr over 60 Minutes Intravenous Every 12 hours 06/03/23 1038 06/03/23 1834   06/03/23 0600  ceFAZolin (ANCEF) IVPB 2g/100 mL premix        2 g 200 mL/hr over 30 Minutes Intravenous On call to O.R. 06/03/23 0533 06/03/23 0730   06/03/23 0600  vancomycin (VANCOCIN) IVPB 1000 mg/200 mL premix        1,000 mg 200 mL/hr over 60 Minutes  Intravenous On call to O.R. 06/03/23 0533 06/03/23 1514        Patient was given sequential compression devices, early ambulation, and chemoprophylaxis to prevent DVT.  Patient benefited maximally from hospital stay and there were no complications.    Recent vital signs: Patient Vitals for the past 24 hrs:  BP Temp Temp src Pulse Resp SpO2  06/04/23 0928 (!) 141/74 98.1 F (36.7 C) -- 95 16 94 %  06/04/23 0547 129/65 99 F (37.2 C) Oral (!) 102 15 95 %  06/04/23 0514 (!) 118/55 98.5 F (36.9 C) Oral 98 17 100 %  06/04/23 0128 (!) 134/55 -- -- (!) 101 -- 96 %  06/04/23 0127 (!) 164/35 98.2 F (36.8 C) Oral 99 17 95 %  06/03/23 2127 133/71 98.2 F (36.8 C) Oral 92 17 97 %     Recent laboratory studies:  Recent Labs    06/04/23 0421  WBC 13.5*  HGB 10.5*  HCT 32.8*  PLT 248  NA 136  K 3.5  CL 101  CO2 28  BUN 16  CREATININE 0.66  GLUCOSE 196*  CALCIUM 8.3*     Discharge Medications:   Allergies as of 06/04/2023       Reactions   Aleve [naproxen Sodium] Shortness Of Breath   Aspirin Shortness Of Breath   Trouble breathing   Ibuprofen Shortness Of Breath  Short of breath   Nsaids Shortness Of Breath   Ampicillin Rash   Erythromycin Nausea And Vomiting        Medication List     STOP taking these medications    HYDROcodone-acetaminophen 5-325 MG tablet Commonly known as: NORCO/VICODIN   traMADol 50 MG tablet Commonly known as: ULTRAM       TAKE these medications    albuterol 108 (90 Base) MCG/ACT inhaler Commonly known as: VENTOLIN HFA Inhale 1-2 puffs into the lungs every 6 (six) hours as needed for wheezing or shortness of breath.   atorvastatin 10 MG tablet Commonly known as: LIPITOR Take 1 tablet (10 mg total) by mouth daily.   CALCIUM 500/D PO Take 2 tablets by mouth daily. 500 mg-200 mg   chlorhexidine 4 % external liquid Commonly known as: HIBICLENS Apply 15 mLs (1 Application total) topically as directed for 30 doses. Use as  directed daily for 5 days every other week for 6 weeks.   EPINEPHrine 0.3 mg/0.3 mL Soaj injection Commonly known as: EPI-PEN Inject 0.3 mg into the muscle as needed for anaphylaxis.   fluticasone 50 MCG/ACT nasal spray Commonly known as: FLONASE Place 1 spray into both nostrils daily as needed for allergies or rhinitis.   LORazepam 0.5 MG tablet Commonly known as: ATIVAN Take 1 tablet (0.5 mg total) by mouth at bedtime. What changed:  when to take this reasons to take this   montelukast 10 MG tablet Commonly known as: SINGULAIR Take 1 tablet by mouth once daily or as directed.   mupirocin ointment 2 % Commonly known as: BACTROBAN Place 1 Application into the nose 2 (two) times daily for 60 doses. Use as directed 2 times daily for 5 days every other week for 6 weeks.   omeprazole 40 MG capsule Commonly known as: PRILOSEC TAKE 1 CAPSULE BY MOUTH TWICE A DAY WITH A MEAL   ondansetron 8 MG tablet Commonly known as: Zofran Take 1 tablet (8 mg total) by mouth every 8 (eight) hours as needed for nausea or vomiting.   oxyCODONE 5 MG immediate release tablet Commonly known as: Oxy IR/ROXICODONE Take 1-2 tablets (5-10 mg total) by mouth every 6 (six) hours as needed for moderate pain (pain score 4-6) (pain score 4-6).   tiZANidine 2 MG tablet Commonly known as: ZANAFLEX Take 1 tablet (2 mg total) by mouth every 8 (eight) hours as needed.               Durable Medical Equipment  (From admission, onward)           Start     Ordered   06/03/23 1039  DME 3 n 1  Once        06/03/23 1038   06/03/23 1039  DME Walker rolling  Once       Question Answer Comment  Walker: With 5 Inch Wheels   Patient needs a walker to treat with the following condition Status post total replacement of left hip      06/03/23 1038            Diagnostic Studies: DG Pelvis Portable  Result Date: 06/03/2023 CLINICAL DATA:  Status post left hip arthroplasty. EXAM: PORTABLE PELVIS 1-2  VIEWS COMPARISON:  None Available. FINDINGS: Left hip arthroplasty in expected alignment. No periprosthetic lucency or fracture. Recent postsurgical change includes air and edema in the soft tissues. Lateral skin staples in place. Previous right hip arthroplasty. IMPRESSION: Left hip arthroplasty without immediate postoperative complication. Electronically Signed  By: Narda Rutherford M.D.   On: 06/03/2023 11:09   DG HIP UNILAT WITH PELVIS 1V LEFT  Result Date: 06/03/2023 CLINICAL DATA:  Elective surgery. EXAM: DG HIP (WITH OR WITHOUT PELVIS) 1V*L* COMPARISON:  None Available. FINDINGS: Three fluoroscopic spot views of the pelvis and left hip obtained in the operating room. Images during hip arthroplasty. Fluoroscopy time 15 seconds. Dose 1.8 mm. Previous right hip arthroplasty. IMPRESSION: Intraoperative fluoroscopy during left hip arthroplasty. Electronically Signed   By: Narda Rutherford M.D.   On: 06/03/2023 11:08   DG C-Arm 1-60 Min-No Report  Result Date: 06/03/2023 Fluoroscopy was utilized by the requesting physician.  No radiographic interpretation.    Disposition: Discharge disposition: 01-Home or Self Care          Follow-up Information     [facility] Follow up.   Specialty: Home Health Services Why: to provide home physical therapy visits Contact information: [address] [place] [address] [phone]         Kathryne Hitch, MD Follow up in 2 week(s).   Specialty: Orthopedic Surgery Contact information: [address] [place] [address] [phone]                  Signed: Kathryne Hitch 06/04/2023, 5:36 PM

## 2023-06-04 NOTE — Progress Notes (Signed)
Physical Therapy Treatment Patient Details Name: Aimee Lawrence MRN: [id number] DOB: 09-25-1945 Today's Date: 06/04/2023   History of Present Illness Pt s/p R THR and with hx of R TKR and osteopenia    PT Comments  Pt continues motivated and up to ambulate in hall, negotiated stairs and reviewed car transfers.  Pt eager for dc home this date.    If plan is discharge home, recommend the following: A little help with walking and/or transfers;A little help with bathing/dressing/bathroom;Assistance with cooking/housework;Assist for transportation;Help with stairs or ramp for entrance   Can travel by private vehicle        Equipment Recommendations  None recommended by PT    Recommendations for Other Services       Precautions / Restrictions Precautions Precautions: Fall Restrictions Weight Bearing Restrictions: No Other Position/Activity Restrictions: WBAT     Mobility  Bed Mobility               General bed mobility comments: UP in chair and returns to same    Transfers Overall transfer level: Needs assistance Equipment used: Rolling walker (2 wheels) Transfers: Sit to/from Stand Sit to Stand: Contact guard assist, Supervision           General transfer comment: cues for LE management and use of UEs to self assist    Ambulation/Gait Ambulation/Gait assistance: Contact guard assist Gait Distance (Feet): 75 Feet Assistive device: Rolling walker (2 wheels) Gait Pattern/deviations: Step-to pattern, Decreased step length - right, Decreased step length - left, Shuffle Gait velocity: decr     General Gait Details: cues for sequence, posture and position from RW; distance pain limited   Stairs Stairs: Yes Stairs assistance: Min assist Stair Management: No rails, Step to pattern, Forwards, With walker Number of Stairs: 2 General stair comments: single step x 2 with cues for sequence   Wheelchair Mobility     Tilt Bed    Modified Rankin (Stroke  Patients Only)       Balance Overall balance assessment: Needs assistance Sitting-balance support: No upper extremity supported, Feet supported Sitting balance-Leahy Scale: Good     Standing balance support: Single extremity supported Standing balance-Leahy Scale: Poor                              Cognition Arousal: Alert Behavior During Therapy: WFL for tasks assessed/performed Overall Cognitive Status: Within Functional Limits for tasks assessed                                          Exercises Total Joint Exercises Ankle Circles/Pumps: AROM, Both, 15 reps, Supine Quad Sets: AROM, Both, 10 reps, Supine Heel Slides: AAROM, Left, 20 reps, Supine Hip ABduction/ADduction: AAROM, Left, 15 reps, Supine    General Comments        Pertinent Vitals/Pain Pain Assessment Pain Assessment: 0-10 Pain Score: 6  Pain Location: L hip Pain Descriptors / Indicators: Aching, Sore Pain Intervention(s): Limited activity within patient's tolerance, Monitored during session, Premedicated before session    Home Living                          Prior Function            PT Goals (current goals can now be found in the care plan section) Acute Rehab PT  Goals Patient Stated Goal: REgain IND PT Goal Formulation: With patient Time For Goal Achievement: 06/10/23 Potential to Achieve Goals: Good Progress towards PT goals: Progressing toward goals    Frequency    7X/week      PT Plan      Co-evaluation              AM-PAC PT "6 Clicks" Mobility   Outcome Measure  Help needed turning from your back to your side while in a flat bed without using bedrails?: A Lot Help needed moving from lying on your back to sitting on the side of a flat bed without using bedrails?: A Lot Help needed moving to and from a bed to a chair (including a wheelchair)?: A Little Help needed standing up from a chair using your arms (e.g., wheelchair or bedside  chair)?: A Little Help needed to walk in hospital room?: A Little Help needed climbing 3-5 steps with a railing? : A Little 6 Click Score: 16    End of Session Equipment Utilized During Treatment: Gait belt Activity Tolerance: Patient tolerated treatment well Patient left: in chair;with call bell/phone within reach;with chair alarm set;with family/visitor present Nurse Communication: Mobility status PT Visit Diagnosis: Unsteadiness on feet (R26.81);Difficulty in walking, not elsewhere classified (R26.2)     Time: 1211-1229 PT Time Calculation (min) (ACUTE ONLY): 18 min  Charges:    $Gait Training: 8-22 mins $Therapeutic Exercise: 8-22 mins PT General Charges $$ ACUTE PT VISIT: 1 Visit                     Mauro Kaufmann PT Acute Rehabilitation Services Pager [phone] Office [phone]    [facility] 06/04/2023, 12:32 PM

## 2023-06-04 NOTE — Care Management Obs Status (Signed)
MEDICARE OBSERVATION STATUS NOTIFICATION   Patient Details  Name: Aimee Lawrence MRN: [id number] Date of Birth: 12/20/1945   Medicare Observation Status Notification Given:  Yes    Halford Chessman 06/04/2023, 12:25 PM

## 2023-06-04 NOTE — Progress Notes (Signed)
Physical Therapy Treatment Patient Details Name: Aimee Lawrence MRN: [id number] DOB: 08/29/1945 Today's Date: 06/04/2023   History of Present Illness Pt s/p R THR and with hx of R TKR and osteopenia    PT Comments  Pt motivated and progressing well with mobility including up to ambulate increased distance in hall and HEP initiated.  Pt now reporting will be staying with a friend rather than home alone.    If plan is discharge home, recommend the following: A little help with walking and/or transfers;A little help with bathing/dressing/bathroom;Assistance with cooking/housework;Assist for transportation;Help with stairs or ramp for entrance   Can travel by private vehicle        Equipment Recommendations  None recommended by PT    Recommendations for Other Services       Precautions / Restrictions Precautions Precautions: Fall Restrictions Weight Bearing Restrictions: No Other Position/Activity Restrictions: WBAT     Mobility  Bed Mobility               General bed mobility comments: UP in chair and returns to same    Transfers Overall transfer level: Needs assistance Equipment used: Rolling walker (2 wheels) Transfers: Sit to/from Stand Sit to Stand: Contact guard assist           General transfer comment: cues for LE management and use of UEs to self assist    Ambulation/Gait Ambulation/Gait assistance: Contact guard assist Gait Distance (Feet): 74 Feet Assistive device: Rolling walker (2 wheels) Gait Pattern/deviations: Step-to pattern, Decreased step length - right, Decreased step length - left, Shuffle Gait velocity: decr     General Gait Details: cues for sequence, posture and position from RW; distance pain limited   Stairs             Wheelchair Mobility     Tilt Bed    Modified Rankin (Stroke Patients Only)       Balance Overall balance assessment: Needs assistance Sitting-balance support: No upper extremity supported,  Feet supported Sitting balance-Leahy Scale: Good     Standing balance support: Single extremity supported Standing balance-Leahy Scale: Poor                              Cognition Arousal: Alert Behavior During Therapy: WFL for tasks assessed/performed Overall Cognitive Status: Within Functional Limits for tasks assessed                                          Exercises Total Joint Exercises Ankle Circles/Pumps: AROM, Both, 15 reps, Supine Quad Sets: AROM, Both, 10 reps, Supine Heel Slides: AAROM, Left, 20 reps, Supine Hip ABduction/ADduction: AAROM, Left, 15 reps, Supine    General Comments        Pertinent Vitals/Pain Pain Assessment Pain Assessment: 0-10 Pain Score: 6  Pain Location: L hip Pain Descriptors / Indicators: Aching, Sore Pain Intervention(s): Limited activity within patient's tolerance, Monitored during session, Premedicated before session, Ice applied    Home Living                          Prior Function            PT Goals (current goals can now be found in the care plan section) Acute Rehab PT Goals Patient Stated Goal: REgain IND PT Goal Formulation: With patient  Time For Goal Achievement: 06/10/23 Potential to Achieve Goals: Good Progress towards PT goals: Progressing toward goals    Frequency    7X/week      PT Plan      Co-evaluation              AM-PAC PT "6 Clicks" Mobility   Outcome Measure  Help needed turning from your back to your side while in a flat bed without using bedrails?: A Lot Help needed moving from lying on your back to sitting on the side of a flat bed without using bedrails?: A Lot Help needed moving to and from a bed to a chair (including a wheelchair)?: A Little Help needed standing up from a chair using your arms (e.g., wheelchair or bedside chair)?: A Little Help needed to walk in hospital room?: A Little Help needed climbing 3-5 steps with a railing? : A  Lot 6 Click Score: 15    End of Session Equipment Utilized During Treatment: Gait belt Activity Tolerance: Patient tolerated treatment well Patient left: in chair;with call bell/phone within reach;with chair alarm set;with family/visitor present Nurse Communication: Mobility status PT Visit Diagnosis: Unsteadiness on feet (R26.81);Difficulty in walking, not elsewhere classified (R26.2)     Time: 8657-8469 PT Time Calculation (min) (ACUTE ONLY): 29 min  Charges:    $Gait Training: 8-22 mins $Therapeutic Exercise: 8-22 mins PT General Charges $$ ACUTE PT VISIT: 1 Visit                     Mauro Kaufmann PT Acute Rehabilitation Services Pager [phone] Office [phone]    Elmer Merwin 06/04/2023, 11:30 AM

## 2023-06-04 NOTE — TOC Transition Note (Addendum)
Transition of Care [facility]) - CM/SW Discharge Note   Patient Details  Name: Aimee Lawrence MRN: [id number] Date of Birth: 02-Jan-1946  [facility]) CM/SW Contact:  Darleene Cleaver, LCSW Phone Number: 06/04/2023, 10:26 AM   Clinical Narrative:    Patient will be going to friend Carol's house, [phone].  Address is [address] [place] [address] [place], [place] [address].  Updated [facility] rep Tia Masker [phone].  Patietn did not need any other DME it was provided already.   Final next level of care: Home w Home Health Services Barriers to Discharge: No Barriers Identified   Patient Goals and CMS Choice      Discharge Placement   Patient's friend Carol's house.             Discharge Plan and Services Additional resources added to the After Visit Summary for                  DME Arranged: N/A DME Agency: NA       HH Arranged: PT HH Agency: [facility]        Social Determinants of Health (SDOH) Interventions SDOH Screenings   Food Insecurity: No Food Insecurity (06/03/2023)  Housing: Patient Declined (06/03/2023)  Transportation Needs: No Transportation Needs (06/03/2023)  Utilities: Not At Risk (06/03/2023)  Alcohol Screen: Low Risk  (10/06/2022)  Depression (PHQ2-9): Low Risk  (11/08/2022)  Financial Resource Strain: Low Risk  (10/06/2022)  Physical Activity: Unknown (10/06/2022)  Social Connections: Unknown (10/06/2022)  Stress: No Stress Concern Present (10/06/2022)  Tobacco Use: Low Risk  (06/03/2023)     Readmission Risk Interventions     No data to display

## 2023-06-05 DIAGNOSIS — M1990 Unspecified osteoarthritis, unspecified site: Secondary | ICD-10-CM | POA: Diagnosis not present

## 2023-06-05 DIAGNOSIS — Z96642 Presence of left artificial hip joint: Secondary | ICD-10-CM | POA: Diagnosis not present

## 2023-06-05 DIAGNOSIS — M8580 Other specified disorders of bone density and structure, unspecified site: Secondary | ICD-10-CM | POA: Diagnosis not present

## 2023-06-05 DIAGNOSIS — G4700 Insomnia, unspecified: Secondary | ICD-10-CM | POA: Diagnosis not present

## 2023-06-05 DIAGNOSIS — I10 Essential (primary) hypertension: Secondary | ICD-10-CM | POA: Diagnosis not present

## 2023-06-05 DIAGNOSIS — Z471 Aftercare following joint replacement surgery: Secondary | ICD-10-CM | POA: Diagnosis not present

## 2023-06-05 DIAGNOSIS — F32A Depression, unspecified: Secondary | ICD-10-CM | POA: Diagnosis not present

## 2023-06-05 DIAGNOSIS — I700 Atherosclerosis of aorta: Secondary | ICD-10-CM | POA: Diagnosis not present

## 2023-06-05 DIAGNOSIS — J45909 Unspecified asthma, uncomplicated: Secondary | ICD-10-CM | POA: Diagnosis not present

## 2023-06-06 ENCOUNTER — Encounter (HOSPITAL_COMMUNITY): Payer: Self-pay | Admitting: Orthopaedic Surgery

## 2023-06-08 ENCOUNTER — Telehealth: Payer: Self-pay | Admitting: *Deleted

## 2023-06-08 ENCOUNTER — Other Ambulatory Visit: Payer: Self-pay | Admitting: Orthopaedic Surgery

## 2023-06-08 DIAGNOSIS — Z96642 Presence of left artificial hip joint: Secondary | ICD-10-CM | POA: Diagnosis not present

## 2023-06-08 DIAGNOSIS — I10 Essential (primary) hypertension: Secondary | ICD-10-CM | POA: Diagnosis not present

## 2023-06-08 DIAGNOSIS — G4700 Insomnia, unspecified: Secondary | ICD-10-CM | POA: Diagnosis not present

## 2023-06-08 DIAGNOSIS — M1990 Unspecified osteoarthritis, unspecified site: Secondary | ICD-10-CM | POA: Diagnosis not present

## 2023-06-08 DIAGNOSIS — Z471 Aftercare following joint replacement surgery: Secondary | ICD-10-CM | POA: Diagnosis not present

## 2023-06-08 DIAGNOSIS — I700 Atherosclerosis of aorta: Secondary | ICD-10-CM | POA: Diagnosis not present

## 2023-06-08 DIAGNOSIS — F32A Depression, unspecified: Secondary | ICD-10-CM | POA: Diagnosis not present

## 2023-06-08 DIAGNOSIS — J45909 Unspecified asthma, uncomplicated: Secondary | ICD-10-CM | POA: Diagnosis not present

## 2023-06-08 DIAGNOSIS — M8580 Other specified disorders of bone density and structure, unspecified site: Secondary | ICD-10-CM | POA: Diagnosis not present

## 2023-06-08 MED ORDER — OXYCODONE HCL 5 MG PO TABS: 5.0000 mg | ORAL_TABLET | Freq: Four times a day (QID) | ORAL | 0 refills | Status: DC | PRN

## 2023-06-08 NOTE — Telephone Encounter (Signed)
Patient called and requested refill of pain medication. Thank you.

## 2023-06-10 DIAGNOSIS — M1990 Unspecified osteoarthritis, unspecified site: Secondary | ICD-10-CM | POA: Diagnosis not present

## 2023-06-10 DIAGNOSIS — Z471 Aftercare following joint replacement surgery: Secondary | ICD-10-CM | POA: Diagnosis not present

## 2023-06-10 DIAGNOSIS — J45909 Unspecified asthma, uncomplicated: Secondary | ICD-10-CM | POA: Diagnosis not present

## 2023-06-10 DIAGNOSIS — M8580 Other specified disorders of bone density and structure, unspecified site: Secondary | ICD-10-CM | POA: Diagnosis not present

## 2023-06-10 DIAGNOSIS — F32A Depression, unspecified: Secondary | ICD-10-CM | POA: Diagnosis not present

## 2023-06-10 DIAGNOSIS — I10 Essential (primary) hypertension: Secondary | ICD-10-CM | POA: Diagnosis not present

## 2023-06-10 DIAGNOSIS — Z96642 Presence of left artificial hip joint: Secondary | ICD-10-CM | POA: Diagnosis not present

## 2023-06-10 DIAGNOSIS — I700 Atherosclerosis of aorta: Secondary | ICD-10-CM | POA: Diagnosis not present

## 2023-06-10 DIAGNOSIS — G4700 Insomnia, unspecified: Secondary | ICD-10-CM | POA: Diagnosis not present

## 2023-06-13 DIAGNOSIS — G4700 Insomnia, unspecified: Secondary | ICD-10-CM | POA: Diagnosis not present

## 2023-06-13 DIAGNOSIS — Z96642 Presence of left artificial hip joint: Secondary | ICD-10-CM | POA: Diagnosis not present

## 2023-06-13 DIAGNOSIS — J45909 Unspecified asthma, uncomplicated: Secondary | ICD-10-CM | POA: Diagnosis not present

## 2023-06-13 DIAGNOSIS — F32A Depression, unspecified: Secondary | ICD-10-CM | POA: Diagnosis not present

## 2023-06-13 DIAGNOSIS — M8580 Other specified disorders of bone density and structure, unspecified site: Secondary | ICD-10-CM | POA: Diagnosis not present

## 2023-06-13 DIAGNOSIS — M1990 Unspecified osteoarthritis, unspecified site: Secondary | ICD-10-CM | POA: Diagnosis not present

## 2023-06-13 DIAGNOSIS — I700 Atherosclerosis of aorta: Secondary | ICD-10-CM | POA: Diagnosis not present

## 2023-06-13 DIAGNOSIS — Z471 Aftercare following joint replacement surgery: Secondary | ICD-10-CM | POA: Diagnosis not present

## 2023-06-13 DIAGNOSIS — I10 Essential (primary) hypertension: Secondary | ICD-10-CM | POA: Diagnosis not present

## 2023-06-15 DIAGNOSIS — I700 Atherosclerosis of aorta: Secondary | ICD-10-CM | POA: Diagnosis not present

## 2023-06-15 DIAGNOSIS — M8580 Other specified disorders of bone density and structure, unspecified site: Secondary | ICD-10-CM | POA: Diagnosis not present

## 2023-06-15 DIAGNOSIS — Z96642 Presence of left artificial hip joint: Secondary | ICD-10-CM | POA: Diagnosis not present

## 2023-06-15 DIAGNOSIS — M1990 Unspecified osteoarthritis, unspecified site: Secondary | ICD-10-CM | POA: Diagnosis not present

## 2023-06-15 DIAGNOSIS — F32A Depression, unspecified: Secondary | ICD-10-CM | POA: Diagnosis not present

## 2023-06-15 DIAGNOSIS — I10 Essential (primary) hypertension: Secondary | ICD-10-CM | POA: Diagnosis not present

## 2023-06-15 DIAGNOSIS — Z471 Aftercare following joint replacement surgery: Secondary | ICD-10-CM | POA: Diagnosis not present

## 2023-06-15 DIAGNOSIS — J45909 Unspecified asthma, uncomplicated: Secondary | ICD-10-CM | POA: Diagnosis not present

## 2023-06-15 DIAGNOSIS — G4700 Insomnia, unspecified: Secondary | ICD-10-CM | POA: Diagnosis not present

## 2023-06-16 ENCOUNTER — Ambulatory Visit: Payer: Medicare HMO | Admitting: Orthopaedic Surgery

## 2023-06-16 DIAGNOSIS — Z96642 Presence of left artificial hip joint: Secondary | ICD-10-CM

## 2023-06-16 MED ORDER — OXYCODONE HCL 5 MG PO TABS: 5.0000 mg | ORAL_TABLET | Freq: Four times a day (QID) | ORAL | 0 refills | Status: DC | PRN

## 2023-06-16 NOTE — Progress Notes (Signed)
The patient is here today for first postoperative visit status post a left total hip arthroplasty.  She says she is doing great and has no issues at all.  On exam she has good range of motion and strength of the left hip.  The incision looks good.  The staples been removed and Steri-Strips applied.  Her calf is soft.  She is ambulating with a cane.  She does need a refill of oxycodone which I did send in for her.  She will continue to increase her activities as comfort allows.  Will see her back in a month to see how she is doing overall but no x-rays are needed.

## 2023-07-08 ENCOUNTER — Telehealth: Payer: Self-pay | Admitting: *Deleted

## 2023-07-08 ENCOUNTER — Other Ambulatory Visit: Payer: Self-pay | Admitting: Physician Assistant

## 2023-07-08 MED ORDER — OXYCODONE HCL 5 MG PO TABS: 5.0000 mg | ORAL_TABLET | Freq: Four times a day (QID) | ORAL | 0 refills | Status: DC | PRN

## 2023-07-08 MED ORDER — TIZANIDINE HCL 2 MG PO TABS: 2.0000 mg | ORAL_TABLET | Freq: Three times a day (TID) | ORAL | 0 refills | Status: AC | PRN

## 2023-07-08 NOTE — Telephone Encounter (Signed)
S/P Left hip replacement on 06/03/23 with Dr. Magnus Ivan. Still using pain medication and muscle relaxer sparingly. She called requesting a refill of both. Pharmacy on chart. Thank you.

## 2023-07-27 ENCOUNTER — Other Ambulatory Visit: Payer: Self-pay | Admitting: Orthopaedic Surgery

## 2023-07-27 ENCOUNTER — Telehealth: Payer: Self-pay | Admitting: *Deleted

## 2023-07-27 MED ORDER — OXYCODONE HCL 5 MG PO TABS: 5.0000 mg | ORAL_TABLET | Freq: Four times a day (QID) | ORAL | 0 refills | Status: AC | PRN

## 2023-07-27 NOTE — Telephone Encounter (Signed)
 Patient called and requested refill of pain medication. Thank you.

## 2023-08-01 ENCOUNTER — Other Ambulatory Visit: Payer: Self-pay

## 2023-08-01 ENCOUNTER — Encounter: Payer: Self-pay | Admitting: Physician Assistant

## 2023-08-01 ENCOUNTER — Ambulatory Visit (INDEPENDENT_AMBULATORY_CARE_PROVIDER_SITE_OTHER): Payer: Medicare HMO | Admitting: Physician Assistant

## 2023-08-01 DIAGNOSIS — M25562 Pain in left knee: Secondary | ICD-10-CM

## 2023-08-01 DIAGNOSIS — Z96642 Presence of left artificial hip joint: Secondary | ICD-10-CM

## 2023-08-01 DIAGNOSIS — G8929 Other chronic pain: Secondary | ICD-10-CM | POA: Diagnosis not present

## 2023-08-01 MED ORDER — LIDOCAINE HCL 1 % IJ SOLN
3.0000 mL | INTRAMUSCULAR | Status: AC | PRN
  Administered 2023-08-01: 3 mL

## 2023-08-01 MED ORDER — METHYLPREDNISOLONE ACETATE 40 MG/ML IJ SUSP
40.0000 mg | INTRAMUSCULAR | Status: AC | PRN
  Administered 2023-08-01: 40 mg via INTRA_ARTICULAR

## 2023-08-01 NOTE — Progress Notes (Signed)
HPI: Aimee Lawrence returns today status post left total hip arthroplasty 06/03/2023.  States the left hip is doing well she is doing home exercises.  Had no fevers chills.  She is having pain mainly in her left knee.  History of right total knee arthroplasty doing well.  She is using a cane to ambulate due to the left knee pain.  Pains anterior aspect of the knee constant.  Worse at night.  States previous injection has been helpful.  No known injury to the knee.  She continues to take tramadol and oxycodone but mainly due to the left knee pain.  Review of systems see HPI otherwise negative  Physical exam: General: Well-developed well-nourished female no acute distress mood affect appropriate.  Left hip: Good range of motion left hip without pain. Left knee: No abnormal warmth erythema or effusion.  Tenderness medial joint line.  Patellofemoral crepitus.  No gross instability valgus varus stressing.  Radiographs: Left knee 2 views: Knee is well located.  Mild narrowing medial compartment.  Severe patellofemoral arthritic changes.  Lateral compartment is well-preserved.  No acute fractures or bony abnormalities.  Impression: Status post left total hip arthroplasty 06/03/2023 Left knee osteoarthritis  Plan: She will continue work on range of motion strengthening left hip.  Regards to the left knee she would like to undergo a left knee cortisone injection today.  Questions were encouraged and answered at length.  Will have her follow-up with [place] in 4 weeks to see how she is doing overall.  Questions were encouraged and answered at length.  Procedure Note  Patient: Aimee Lawrence             Date of Birth: Jul 05, 1946           MRN: [id number]             Visit Date: 08/01/2023  Procedures: Visit Diagnoses:  1. Chronic pain of left knee   2. Status post total replacement of left hip     Large Joint Inj on 08/01/2023 6:14 PM Medications: 3 mL lidocaine 1 %; 40 mg methylPREDNISolone acetate 40  MG/ML Consent was given by the patient. Immediately prior to procedure a time out was called to verify the correct patient, procedure, equipment, support staff and site/side marked as required. Patient was prepped and draped in the usual sterile fashion.

## 2023-08-02 DIAGNOSIS — H5203 Hypermetropia, bilateral: Secondary | ICD-10-CM | POA: Diagnosis not present

## 2023-08-03 ENCOUNTER — Telehealth: Payer: Self-pay | Admitting: Radiology

## 2023-08-03 NOTE — Telephone Encounter (Signed)
Attempted to contact patient to make 4 week f/u appointment with Bronson Curb.  I left message for patient to call back

## 2023-08-19 ENCOUNTER — Encounter: Payer: Self-pay | Admitting: Family Medicine

## 2023-08-19 ENCOUNTER — Ambulatory Visit (INDEPENDENT_AMBULATORY_CARE_PROVIDER_SITE_OTHER): Payer: Medicare HMO | Admitting: Family Medicine

## 2023-08-19 VITALS — BP 122/80 | HR 88 | Temp 98.3°F | Resp 16 | Ht 59.0 in | Wt 168.2 lb

## 2023-08-19 DIAGNOSIS — J029 Acute pharyngitis, unspecified: Secondary | ICD-10-CM | POA: Diagnosis not present

## 2023-08-19 DIAGNOSIS — J069 Acute upper respiratory infection, unspecified: Secondary | ICD-10-CM | POA: Diagnosis not present

## 2023-08-19 LAB — POCT INFLUENZA A/B
Influenza A, POC: NEGATIVE
Influenza B, POC: NEGATIVE

## 2023-08-19 LAB — POC COVID19 BINAXNOW: SARS Coronavirus 2 Ag: NEGATIVE

## 2023-08-19 LAB — POCT RAPID STREP A (OFFICE): Rapid Strep A Screen: NEGATIVE

## 2023-08-19 MED ORDER — MAGIC MOUTHWASH W/LIDOCAINE: 5.0000 mL | Freq: Two times a day (BID) | ORAL | 0 refills | Status: AC | PRN

## 2023-08-19 NOTE — Progress Notes (Signed)
 ACUTE VISIT Chief Complaint  Patient presents with   Sore Throat    X2-3 days    HPI: [name] is a 78 y.o. female with a PMHx significant for HTN, aortic atherosclerosis, asthma, OA, insomnia, and depression, who is here today complaining of sore throat and body aches.   Patient complains of sore throat for 2-3 days.  She says it feels better than it did this morning.   Sore Throat  This is a new problem. The problem has been gradually improving. There has been no fever. The pain is moderate. Associated symptoms include headaches. Pertinent negatives include no abdominal pain, congestion, diarrhea, drooling, ear discharge, ear pain, hoarse voice, plugged ear sensation, neck pain, shortness of breath, stridor, swollen glands, trouble swallowing or vomiting. She has had no exposure to strep or mono. She has tried nothing for the symptoms.   Also endorses associated frontal headache.  She has been taking her Singulair  10 mg daily, but nothing else OTC for acute symptoms.  She has a hx of asthma, has not noted cough, wheezing or SOB. + Nasal congestion and rhinorrhea, which she attributes to allergies.  Review of Systems  Constitutional:  Positive for fatigue. Negative for fever.  HENT:  Negative for congestion, drooling, ear discharge, ear pain, hoarse voice, sneezing and trouble swallowing.   Respiratory:  Negative for shortness of breath and stridor.   Cardiovascular:  Negative for chest pain.  Gastrointestinal:  Negative for abdominal pain, diarrhea and vomiting.  Genitourinary:  Negative for decreased urine volume, dysuria and hematuria.  Musculoskeletal:  Negative for neck pain.  Skin:  Negative for rash.  Allergic/Immunologic: Positive for environmental allergies.  Neurological:  Positive for headaches. Negative for syncope and weakness.  See other pertinent positives and negatives in HPI.  Current Outpatient Medications on File Prior to Visit  Medication Sig  Dispense Refill   albuterol  (VENTOLIN  HFA) 108 (90 Base) MCG/ACT inhaler Inhale 1-2 puffs into the lungs every 6 (six) hours as needed for wheezing or shortness of breath. 8.5 g 0   atorvastatin  (LIPITOR) 10 MG tablet Take 1 tablet (10 mg total) by mouth daily. 90 tablet 3   Calcium  Carbonate-Vitamin D  (CALCIUM  500/D PO) Take 2 tablets by mouth daily. 500 mg-200 mg     chlorhexidine  (HIBICLENS ) 4 % external liquid Apply 15 mLs (1 Application total) topically as directed for 30 doses. Use as directed daily for 5 days every other week for 6 weeks. 946 mL 1   EPINEPHrine  0.3 mg/0.3 mL IJ SOAJ injection Inject 0.3 mg into the muscle as needed for anaphylaxis.     fluticasone  (FLONASE ) 50 MCG/ACT nasal spray Place 1 spray into both nostrils daily as needed for allergies or rhinitis.     LORazepam  (ATIVAN ) 0.5 MG tablet Take 1 tablet (0.5 mg total) by mouth at bedtime. (Patient taking differently: Take 0.5 mg by mouth at bedtime as needed for anxiety or sleep.) 30 tablet 5   montelukast  (SINGULAIR ) 10 MG tablet Take 1 tablet by mouth once daily or as directed. 90 tablet 3   omeprazole  (PRILOSEC) 40 MG capsule TAKE 1 CAPSULE BY MOUTH TWICE A DAY WITH A MEAL 60 capsule 1   ondansetron  (ZOFRAN ) 8 MG tablet Take 1 tablet (8 mg total) by mouth every 8 (eight) hours as needed for nausea or vomiting. 60 tablet 3   oxyCODONE  (OXY IR/ROXICODONE ) 5 MG immediate release tablet Take 1-2 tablets (5-10 mg total) by mouth every 6 (six) hours as needed  for moderate pain (pain score 4-6) (pain score 4-6). 30 tablet 0   tiZANidine  (ZANAFLEX ) 2 MG tablet Take 1 tablet (2 mg total) by mouth every 8 (eight) hours as needed. 30 tablet 0   No current facility-administered medications on file prior to visit.    Past Medical History:  Diagnosis Date   Allergy     takes Doxycycline  daily and uses Flonase  daily   Arthritis    Asthma    Albuterol  inhaler as needed.   Dyspnea    rarely and sitting   GERD (gastroesophageal  reflux disease)    not on any meds   History of bronchitis    History of shingles    Hx of colonic polyps    benign   Hypertension    Joint pain    Joint swelling    Osteopenia    takes Vit D3 and Calcium  daily   Primary localized osteoarthritis of right knee    Allergies  Allergen Reactions   Aleve [Naproxen Sodium] Shortness Of Breath   Aspirin  Shortness Of Breath    Trouble breathing   Ibuprofen Shortness Of Breath    Short of breath   Nsaids Shortness Of Breath   Ampicillin Rash   Erythromycin Nausea And Vomiting    Social History   Socioeconomic History   Marital status: Single    Spouse name: Not on file   Number of children: 0   Years of education: Not on file   Highest education level: Master's degree (e.g., MA, MS, MEng, MEd, MSW, MBA)  Occupational History   Occupation: Retired runner, broadcasting/film/video  Tobacco Use   Smoking status: Never   Smokeless tobacco: Never  Vaping Use   Vaping status: Never Used  Substance and Sexual Activity   Alcohol use: No    Alcohol/week: 0.0 standard drinks of alcohol    Comment: no etoh    Drug use: No   Sexual activity: Not on file  Other Topics Concern   Not on file  Social History Narrative   Retired   Single   Regular exercise- no         Social Drivers of Corporate Investment Banker Strain: Low Risk  (10/06/2022)   Overall Financial Resource Strain (CARDIA)    Difficulty of Paying Living Expenses: Not hard at all  Food Insecurity: No Food Insecurity (06/03/2023)   Hunger Vital Sign    Worried About Running Out of Food in the Last Year: Never true    Ran Out of Food in the Last Year: Never true  Transportation Needs: No Transportation Needs (06/03/2023)   PRAPARE - Administrator, Civil Service (Medical): No    Lack of Transportation (Non-Medical): No  Physical Activity: Unknown (10/06/2022)   Exercise Vital Sign    Days of Exercise per Week: 0 days    Minutes of Exercise per Session: Not on file  Stress:  No Stress Concern Present (10/06/2022)   [employer] of Occupational Health - Occupational Stress Questionnaire    Feeling of Stress : Not at all  Social Connections: Unknown (10/06/2022)   Social Connection and Isolation Panel [NHANES]    Frequency of Communication with Friends and Family: More than three times a week    Frequency of Social Gatherings with Friends and Family: More than three times a week    Attends Religious Services: Patient declined    Active Member of Clubs or Organizations: No    Attends Banker Meetings: Not on  file    Marital Status: Patient declined    Vitals:   08/19/23 1600  BP: 122/80  Pulse: 88  Resp: 16  Temp: 98.3 F (36.8 C)  SpO2: 96%   Body mass index is 33.98 kg/m.  Physical Exam Vitals and nursing note reviewed.  Constitutional:      General: She is not in acute distress.    Appearance: She is well-developed. She is not ill-appearing.  HENT:     Head: Normocephalic and atraumatic.     Right Ear: Tympanic membrane, ear canal and external ear normal. Tympanic membrane is not erythematous.     Left Ear: Tympanic membrane, ear canal and external ear normal. Tympanic membrane is not erythematous.     Nose: Rhinorrhea present.     Right Sinus: No maxillary sinus tenderness or frontal sinus tenderness.     Left Sinus: No maxillary sinus tenderness or frontal sinus tenderness.     Mouth/Throat:     Mouth: Mucous membranes are moist.     Pharynx: Oropharynx is clear. Uvula midline. Posterior oropharyngeal erythema present. No oropharyngeal exudate.  Eyes:     Conjunctiva/sclera: Conjunctivae normal.  Cardiovascular:     Rate and Rhythm: Normal rate and regular rhythm.     Heart sounds: No murmur heard. Pulmonary:     Effort: Pulmonary effort is normal. No respiratory distress.     Breath sounds: Normal breath sounds. No stridor.  Lymphadenopathy:     Head:     Right side of head: No submandibular adenopathy.     Left side  of head: No submandibular adenopathy.     Cervical: No cervical adenopathy.  Skin:    General: Skin is warm.     Findings: No erythema or rash.  Neurological:     General: No focal deficit present.     Mental Status: She is alert and oriented to person, place, and time.     Comments: Gait assisted with a cane.  Psychiatric:        Mood and Affect: Mood and affect normal.   ASSESSMENT AND PLAN:  Ms. Cogliano was seen today for sore throat and body aches.   Sore throat Rapid strep negative. Clinical findings do not suggest step infection. Symptomatic treatment with throat lozenges and magic mouth with Lidocaine  recommended. Monitor for new symptoms.  -     POCT Influenza A/B -     POCT rapid strep A -     POC COVID-19 BinaxNow -     magic mouthwash w/lidocaine ; Take 5 mLs by mouth 2 (two) times daily as needed for up to 10 days for mouth pain. 50 ml of diphenhydramine , alum and mag hydroxide, and lidocaine  to make 150 ml  Dispense: 100 mL; Refill: 0  URI, acute Symptoms suggests a viral etiology, symptomatic treatment recommended. COVID 19 and rapid flu negative. Instructed to monitor for signs of complications, including new onset of fever among some, clearly instructed about warning signs. F/U as needed.  -     POCT Influenza A/B -     POC COVID-19 BinaxNow  Return if symptoms worsen or fail to improve.  I, Leonce PARAS Wierda, acting as a scribe for Kellsey Sansone, MD., have documented all relevant documentation on the behalf of Lenn Volker, MD, as directed by  Dura Mccormack, MD while in the presence of Josslynn Mentzer, MD.   I, Brinsley Wence, MD, have reviewed all documentation for this visit. The documentation on 08/19/23 for the exam, diagnosis, procedures,  and orders are all accurate and complete.  Mala Gibbard G. Kynsli Haapala, MD  [facility] [place] office.

## 2023-08-19 NOTE — Patient Instructions (Addendum)
 A few things to remember from today's visit:  Sore throat - Plan: POC Rapid Strep A, magic mouthwash w/lidocaine  SOLN  Body aches - Plan: POCT Influenza A/B, POC COVID-19  Acute viral pharyngitis - Plan: magic mouthwash w/lidocaine  SOLN  Throat lozenges. Monitor for fever.  If you need refills for medications you take chronically, please call your pharmacy. Do not use My Chart to request refills or for acute issues that need immediate attention. If you send a my chart message, it may take a few days to be addressed, specially if I am not in the office.  Please be sure medication list is accurate. If a new problem present, please set up appointment sooner than planned today.

## 2023-10-12 DIAGNOSIS — L814 Other melanin hyperpigmentation: Secondary | ICD-10-CM | POA: Diagnosis not present

## 2023-10-12 DIAGNOSIS — L2989 Other pruritus: Secondary | ICD-10-CM | POA: Diagnosis not present

## 2023-10-12 DIAGNOSIS — L820 Inflamed seborrheic keratosis: Secondary | ICD-10-CM | POA: Diagnosis not present

## 2023-10-12 DIAGNOSIS — D485 Neoplasm of uncertain behavior of skin: Secondary | ICD-10-CM | POA: Diagnosis not present

## 2023-10-12 DIAGNOSIS — L239 Allergic contact dermatitis, unspecified cause: Secondary | ICD-10-CM | POA: Diagnosis not present

## 2023-10-12 DIAGNOSIS — L538 Other specified erythematous conditions: Secondary | ICD-10-CM | POA: Diagnosis not present

## 2023-10-12 DIAGNOSIS — B353 Tinea pedis: Secondary | ICD-10-CM | POA: Diagnosis not present

## 2023-10-12 DIAGNOSIS — D225 Melanocytic nevi of trunk: Secondary | ICD-10-CM | POA: Diagnosis not present

## 2023-10-12 DIAGNOSIS — B351 Tinea unguium: Secondary | ICD-10-CM | POA: Diagnosis not present

## 2023-10-12 DIAGNOSIS — L821 Other seborrheic keratosis: Secondary | ICD-10-CM | POA: Diagnosis not present

## 2023-10-27 ENCOUNTER — Other Ambulatory Visit: Payer: Self-pay | Admitting: Family Medicine

## 2023-12-29 ENCOUNTER — Other Ambulatory Visit: Payer: Self-pay | Admitting: Family Medicine

## 2024-04-11 ENCOUNTER — Ambulatory Visit: Admitting: Family Medicine

## 2024-04-11 ENCOUNTER — Encounter: Payer: Self-pay | Admitting: Family Medicine

## 2024-04-11 VITALS — BP 124/78 | HR 84 | Temp 98.2°F | Wt 157.2 lb

## 2024-04-11 DIAGNOSIS — N390 Urinary tract infection, site not specified: Secondary | ICD-10-CM

## 2024-04-11 LAB — POCT URINALYSIS DIPSTICK
Bilirubin, UA: NEGATIVE
Blood, UA: POSITIVE
Glucose, UA: NEGATIVE
Ketones, UA: NEGATIVE
Nitrite, UA: NEGATIVE
Protein, UA: POSITIVE — AB
Spec Grav, UA: 1.02 (ref 1.010–1.025)
Urobilinogen, UA: 0.2 U/dL
pH, UA: 5 (ref 5.0–8.0)

## 2024-04-11 MED ORDER — TRAMADOL HCL 50 MG PO TABS: 100.0000 mg | ORAL_TABLET | Freq: Four times a day (QID) | ORAL | 5 refills | Status: AC | PRN

## 2024-04-11 MED ORDER — CIPROFLOXACIN HCL 500 MG PO TABS: 500.0000 mg | ORAL_TABLET | Freq: Two times a day (BID) | ORAL | 0 refills | Status: AC

## 2024-04-11 NOTE — Progress Notes (Signed)
   Subjective:    Patient ID: Aimee Lawrence, female    DOB: [birth year], 78 y.o.   MRN: [id number]  HPI Here for 5 days of urinary urgency and burning. No fever. She has never had a UTI before.    Review of Systems  Constitutional: Negative.   Respiratory: Negative.    Cardiovascular: Negative.   Genitourinary:  Positive for dysuria, frequency and urgency. Negative for flank pain and hematuria.       Objective:   Physical Exam Constitutional:      Appearance: Normal appearance.  Cardiovascular:     Rate and Rhythm: Normal rate and regular rhythm.     Pulses: Normal pulses.     Heart sounds: Normal heart sounds.  Pulmonary:     Effort: Pulmonary effort is normal.     Breath sounds: Normal breath sounds.  Abdominal:     Tenderness: There is no right CVA tenderness or left CVA tenderness.  Neurological:     Mental Status: She is alert.           Assessment & Plan:  UTI, treat with 7 days of Cipro . Culture the sample.  Garnette Olmsted, MD

## 2024-04-11 NOTE — Addendum Note (Signed)
 Addended by: LADONNA INOCENTE SAILOR on: 04/11/2024 10:20 AM   Modules accepted: Orders

## 2024-04-14 LAB — URINE CULTURE
MICRO NUMBER:: 17041980
SPECIMEN QUALITY:: ADEQUATE

## 2024-04-16 ENCOUNTER — Ambulatory Visit: Payer: Self-pay | Admitting: Family Medicine

## 2024-05-14 ENCOUNTER — Encounter: Payer: Self-pay | Admitting: Radiology
